# Patient Record
Sex: Female | Born: 1940 | Race: Black or African American | Hispanic: No | Marital: Married | Smoking: Never smoker
Health system: Southern US, Community
[De-identification: ages and names within clinical notes are randomized; demographics above are authoritative.]

## PROBLEM LIST (undated history)

## (undated) DIAGNOSIS — I10 Essential (primary) hypertension: Secondary | ICD-10-CM

## (undated) DIAGNOSIS — M7989 Other specified soft tissue disorders: Secondary | ICD-10-CM

## (undated) DIAGNOSIS — R197 Diarrhea, unspecified: Secondary | ICD-10-CM

## (undated) DIAGNOSIS — E785 Hyperlipidemia, unspecified: Secondary | ICD-10-CM

## (undated) DIAGNOSIS — E669 Obesity, unspecified: Secondary | ICD-10-CM

## (undated) DIAGNOSIS — K219 Gastro-esophageal reflux disease without esophagitis: Secondary | ICD-10-CM

## (undated) DIAGNOSIS — N189 Chronic kidney disease, unspecified: Secondary | ICD-10-CM

## (undated) DIAGNOSIS — H3530 Unspecified macular degeneration: Secondary | ICD-10-CM

## (undated) DIAGNOSIS — R739 Hyperglycemia, unspecified: Secondary | ICD-10-CM

## (undated) DIAGNOSIS — E739 Lactose intolerance, unspecified: Secondary | ICD-10-CM

## (undated) HISTORY — DX: Diarrhea, unspecified: R19.7

## (undated) HISTORY — DX: Gastro-esophageal reflux disease without esophagitis: K21.9

## (undated) HISTORY — DX: Unspecified macular degeneration: H35.30

## (undated) HISTORY — DX: Hyperlipidemia, unspecified: E78.5

## (undated) HISTORY — PX: PARS PLANA VITRECTOMY W/ REPAIR OF MACULAR HOLE: SHX2170

## (undated) HISTORY — DX: Lactose intolerance, unspecified: E73.9

## (undated) HISTORY — DX: Other specified soft tissue disorders: M79.89

## (undated) HISTORY — PX: CATARACT EXTRACTION: SUR2

## (undated) HISTORY — DX: Hyperglycemia, unspecified: R73.9

## (undated) HISTORY — DX: Obesity, unspecified: E66.9

## (undated) HISTORY — DX: Essential (primary) hypertension: I10

## (undated) HISTORY — DX: Chronic kidney disease, unspecified: N18.9

---

## 1983-11-24 HISTORY — PX: APPENDECTOMY: SHX54

## 1983-11-24 HISTORY — PX: ABDOMINAL HYSTERECTOMY: SHX81

## 1999-05-14 ENCOUNTER — Ambulatory Visit (HOSPITAL_COMMUNITY): Admission: RE | Admit: 1999-05-14 | Discharge: 1999-05-14 | Payer: Self-pay | Admitting: Internal Medicine

## 1999-05-14 ENCOUNTER — Encounter: Payer: Self-pay | Admitting: Internal Medicine

## 2002-01-24 ENCOUNTER — Ambulatory Visit (HOSPITAL_COMMUNITY): Admission: RE | Admit: 2002-01-24 | Discharge: 2002-01-24 | Payer: Self-pay | Admitting: Endocrinology

## 2002-01-24 ENCOUNTER — Encounter: Payer: Self-pay | Admitting: Endocrinology

## 2002-01-29 ENCOUNTER — Emergency Department (HOSPITAL_COMMUNITY): Admission: EM | Admit: 2002-01-29 | Discharge: 2002-01-29 | Payer: Self-pay | Admitting: Emergency Medicine

## 2002-02-06 ENCOUNTER — Encounter: Payer: Self-pay | Admitting: Neurosurgery

## 2002-02-06 ENCOUNTER — Encounter: Admission: RE | Admit: 2002-02-06 | Discharge: 2002-02-06 | Payer: Self-pay | Admitting: Neurosurgery

## 2002-02-08 ENCOUNTER — Encounter: Payer: Self-pay | Admitting: Neurosurgery

## 2002-02-10 ENCOUNTER — Encounter: Payer: Self-pay | Admitting: Neurosurgery

## 2002-02-10 ENCOUNTER — Inpatient Hospital Stay (HOSPITAL_COMMUNITY): Admission: RE | Admit: 2002-02-10 | Discharge: 2002-02-11 | Payer: Self-pay | Admitting: Neurosurgery

## 2003-08-29 ENCOUNTER — Ambulatory Visit (HOSPITAL_COMMUNITY): Admission: RE | Admit: 2003-08-29 | Discharge: 2003-08-29 | Payer: Self-pay | Admitting: Internal Medicine

## 2003-08-29 ENCOUNTER — Encounter: Payer: Self-pay | Admitting: Internal Medicine

## 2004-03-18 ENCOUNTER — Encounter: Admission: RE | Admit: 2004-03-18 | Discharge: 2004-04-03 | Payer: Self-pay | Admitting: Internal Medicine

## 2005-03-03 ENCOUNTER — Ambulatory Visit: Payer: Self-pay

## 2005-03-24 ENCOUNTER — Ambulatory Visit: Payer: Self-pay | Admitting: Internal Medicine

## 2005-03-25 ENCOUNTER — Ambulatory Visit (HOSPITAL_COMMUNITY): Admission: RE | Admit: 2005-03-25 | Discharge: 2005-03-25 | Payer: Self-pay | Admitting: Internal Medicine

## 2005-04-04 ENCOUNTER — Ambulatory Visit (HOSPITAL_COMMUNITY): Admission: RE | Admit: 2005-04-04 | Discharge: 2005-04-04 | Payer: Self-pay | Admitting: Internal Medicine

## 2005-04-14 ENCOUNTER — Ambulatory Visit: Payer: Self-pay | Admitting: Internal Medicine

## 2005-08-03 ENCOUNTER — Ambulatory Visit: Payer: Self-pay | Admitting: Internal Medicine

## 2005-08-04 ENCOUNTER — Ambulatory Visit: Payer: Self-pay | Admitting: Internal Medicine

## 2005-11-23 HISTORY — PX: LUMBAR LAMINECTOMY: SHX95

## 2005-11-27 ENCOUNTER — Ambulatory Visit: Payer: Self-pay | Admitting: Internal Medicine

## 2005-12-01 ENCOUNTER — Ambulatory Visit: Payer: Self-pay | Admitting: Internal Medicine

## 2005-12-10 ENCOUNTER — Ambulatory Visit (HOSPITAL_COMMUNITY): Admission: RE | Admit: 2005-12-10 | Discharge: 2005-12-10 | Payer: Self-pay | Admitting: Internal Medicine

## 2006-05-24 ENCOUNTER — Ambulatory Visit: Payer: Self-pay | Admitting: Internal Medicine

## 2006-05-28 ENCOUNTER — Ambulatory Visit: Payer: Self-pay | Admitting: Internal Medicine

## 2006-12-16 ENCOUNTER — Ambulatory Visit: Payer: Self-pay | Admitting: Internal Medicine

## 2006-12-16 LAB — CONVERTED CEMR LAB
Alkaline Phosphatase: 72 units/L (ref 39–117)
BUN: 28 mg/dL — ABNORMAL HIGH (ref 6–23)
CO2: 31 meq/L (ref 19–32)
Chloride: 107 meq/L (ref 96–112)
GFR calc Af Amer: 45 mL/min
GFR calc non Af Amer: 37 mL/min
Glucose, Bld: 105 mg/dL — ABNORMAL HIGH (ref 70–99)
HDL: 34.1 mg/dL — ABNORMAL LOW (ref >39.0)
Hgb A1c MFr Bld: 6.1 % — ABNORMAL HIGH (ref 4.6–6.0)
LDL Cholesterol: 99 mg/dL (ref 0–99)
Sodium: 145 meq/L (ref 135–145)
Total CHOL/HDL Ratio: 4.5
Triglycerides: 101 mg/dL (ref 0–149)
VLDL: 20 mg/dL (ref 0–40)

## 2006-12-20 ENCOUNTER — Ambulatory Visit: Payer: Self-pay | Admitting: Internal Medicine

## 2007-02-09 ENCOUNTER — Ambulatory Visit: Payer: Self-pay | Admitting: Gastroenterology

## 2007-03-15 ENCOUNTER — Ambulatory Visit: Payer: Self-pay | Admitting: Internal Medicine

## 2007-03-15 ENCOUNTER — Ambulatory Visit: Payer: Self-pay

## 2007-03-15 LAB — CONVERTED CEMR LAB
ALT: 92 units/L — ABNORMAL HIGH (ref 0–40)
AST: 83 units/L — ABNORMAL HIGH (ref 0–37)
Alkaline Phosphatase: 71 units/L (ref 39–117)
Iron: 45 ug/dL (ref 42–145)
Total Bilirubin: 0.7 mg/dL (ref 0.3–1.2)

## 2007-03-22 ENCOUNTER — Ambulatory Visit: Payer: Self-pay | Admitting: Internal Medicine

## 2007-04-12 ENCOUNTER — Ambulatory Visit: Payer: Self-pay | Admitting: Gastroenterology

## 2007-04-12 LAB — CONVERTED CEMR LAB
ALT: 44 units/L — ABNORMAL HIGH (ref 0–40)
AST: 47 units/L — ABNORMAL HIGH (ref 0–37)
Anti Nuclear Antibody(ANA): NEGATIVE
Bilirubin, Direct: 0.2 mg/dL (ref 0.0–0.3)
Ferritin: 70.2 ng/mL (ref 10.0–291.0)
Hepatitis B Surface Ag: NEGATIVE
Saturation Ratios: 23.5 % (ref 20.0–50.0)
Total Bilirubin: 0.7 mg/dL (ref 0.3–1.2)

## 2007-09-19 ENCOUNTER — Ambulatory Visit: Payer: Self-pay | Admitting: Internal Medicine

## 2007-09-21 ENCOUNTER — Encounter: Payer: Self-pay | Admitting: Internal Medicine

## 2007-09-21 ENCOUNTER — Ambulatory Visit: Payer: Self-pay | Admitting: Internal Medicine

## 2007-09-21 DIAGNOSIS — E538 Deficiency of other specified B group vitamins: Secondary | ICD-10-CM | POA: Insufficient documentation

## 2007-09-21 DIAGNOSIS — I129 Hypertensive chronic kidney disease with stage 1 through stage 4 chronic kidney disease, or unspecified chronic kidney disease: Secondary | ICD-10-CM | POA: Insufficient documentation

## 2007-09-21 DIAGNOSIS — M79609 Pain in unspecified limb: Secondary | ICD-10-CM | POA: Insufficient documentation

## 2007-09-21 DIAGNOSIS — R609 Edema, unspecified: Secondary | ICD-10-CM | POA: Insufficient documentation

## 2007-10-18 ENCOUNTER — Encounter: Payer: Self-pay | Admitting: Internal Medicine

## 2007-12-23 ENCOUNTER — Encounter: Payer: Self-pay | Admitting: Internal Medicine

## 2008-01-06 ENCOUNTER — Encounter: Payer: Self-pay | Admitting: Internal Medicine

## 2008-02-28 ENCOUNTER — Ambulatory Visit: Payer: Self-pay | Admitting: Internal Medicine

## 2008-03-14 ENCOUNTER — Ambulatory Visit: Payer: Self-pay

## 2008-03-14 ENCOUNTER — Ambulatory Visit: Payer: Self-pay | Admitting: Cardiology

## 2008-03-14 LAB — CONVERTED CEMR LAB
ALT: 31 units/L (ref 0–35)
AST: 36 units/L (ref 0–37)
Alkaline Phosphatase: 60 units/L (ref 39–117)
Calcium: 9.1 mg/dL (ref 8.4–10.5)
Chloride: 105 meq/L (ref 96–112)
Cholesterol: 259 mg/dL (ref 0–200)
Direct LDL: 176.5 mg/dL
GFR calc Af Amer: 45 mL/min
GFR calc non Af Amer: 37 mL/min
HDL: 29.2 mg/dL — ABNORMAL LOW (ref >39.0)
Potassium: 3.4 meq/L — ABNORMAL LOW (ref 3.5–5.1)
Total CHOL/HDL Ratio: 8.9
Total Protein: 6.4 g/dL (ref 6.0–8.3)
Triglycerides: 156 mg/dL — ABNORMAL HIGH (ref 0–149)
VLDL: 31 mg/dL (ref 0–40)

## 2008-03-21 ENCOUNTER — Ambulatory Visit: Payer: Self-pay | Admitting: Internal Medicine

## 2008-03-21 DIAGNOSIS — E785 Hyperlipidemia, unspecified: Secondary | ICD-10-CM | POA: Insufficient documentation

## 2008-04-30 ENCOUNTER — Encounter: Payer: Self-pay | Admitting: Internal Medicine

## 2008-05-30 ENCOUNTER — Encounter: Payer: Self-pay | Admitting: Internal Medicine

## 2008-05-31 ENCOUNTER — Telehealth: Payer: Self-pay | Admitting: Internal Medicine

## 2008-07-17 ENCOUNTER — Ambulatory Visit: Payer: Self-pay | Admitting: Internal Medicine

## 2008-07-18 LAB — CONVERTED CEMR LAB
Albumin: 3.3 g/dL — ABNORMAL LOW (ref 3.5–5.2)
Alkaline Phosphatase: 53 units/L (ref 39–117)
CO2: 29 meq/L (ref 19–32)
GFR calc Af Amer: 41 mL/min
Glucose, Bld: 106 mg/dL — ABNORMAL HIGH (ref 70–99)
Hgb A1c MFr Bld: 6.2 % — ABNORMAL HIGH (ref 4.6–6.0)
Total Protein: 6.2 g/dL (ref 6.0–8.3)

## 2008-07-20 ENCOUNTER — Ambulatory Visit: Payer: Self-pay | Admitting: Internal Medicine

## 2008-07-20 DIAGNOSIS — N289 Disorder of kidney and ureter, unspecified: Secondary | ICD-10-CM | POA: Insufficient documentation

## 2008-10-29 ENCOUNTER — Ambulatory Visit: Payer: Self-pay | Admitting: Internal Medicine

## 2008-10-29 LAB — CONVERTED CEMR LAB
BUN: 26 mg/dL — ABNORMAL HIGH
CO2: 28 meq/L
Calcium: 9.4 mg/dL
Chloride: 106 meq/L
Creatinine, Ser: 1.7 mg/dL — ABNORMAL HIGH
GFR calc Af Amer: 39 mL/min
GFR calc non Af Amer: 32 mL/min
Glucose, Bld: 111 mg/dL — ABNORMAL HIGH
Hgb A1c MFr Bld: 6.3 % — ABNORMAL HIGH
Potassium: 3.8 meq/L
Sodium: 144 meq/L
TSH: 2.08 u[IU]/mL
Vitamin B-12: 889 pg/mL

## 2008-11-02 ENCOUNTER — Ambulatory Visit: Payer: Self-pay | Admitting: Internal Medicine

## 2008-11-02 DIAGNOSIS — R197 Diarrhea, unspecified: Secondary | ICD-10-CM | POA: Insufficient documentation

## 2008-11-02 DIAGNOSIS — R071 Chest pain on breathing: Secondary | ICD-10-CM | POA: Insufficient documentation

## 2009-01-03 ENCOUNTER — Encounter: Payer: Self-pay | Admitting: Internal Medicine

## 2009-01-08 ENCOUNTER — Encounter: Payer: Self-pay | Admitting: Internal Medicine

## 2009-03-05 ENCOUNTER — Ambulatory Visit: Payer: Self-pay | Admitting: Internal Medicine

## 2009-03-05 LAB — CONVERTED CEMR LAB
Alkaline Phosphatase: 76 units/L (ref 39–117)
BUN: 21 mg/dL (ref 6–23)
CO2: 25 meq/L (ref 19–32)
Chloride: 112 meq/L (ref 96–112)
Creatinine, Ser: 1.2 mg/dL (ref 0.4–1.2)
GFR calc non Af Amer: 57.66 mL/min (ref >60)
Glucose, Bld: 100 mg/dL — ABNORMAL HIGH (ref 70–99)
HDL: 28.6 mg/dL — ABNORMAL LOW (ref >39.00)
Total Bilirubin: 0.6 mg/dL (ref 0.3–1.2)
Total CHOL/HDL Ratio: 8
Total Protein: 6 g/dL (ref 6.0–8.3)
Triglycerides: 155 mg/dL — ABNORMAL HIGH (ref 0.0–149.0)

## 2009-03-07 ENCOUNTER — Ambulatory Visit: Payer: Self-pay | Admitting: Internal Medicine

## 2009-03-14 ENCOUNTER — Ambulatory Visit: Payer: Self-pay

## 2009-03-14 ENCOUNTER — Encounter: Payer: Self-pay | Admitting: Internal Medicine

## 2009-03-18 ENCOUNTER — Telehealth: Payer: Self-pay | Admitting: Internal Medicine

## 2009-03-21 ENCOUNTER — Telehealth: Payer: Self-pay | Admitting: Internal Medicine

## 2009-07-05 ENCOUNTER — Ambulatory Visit: Payer: Self-pay | Admitting: Internal Medicine

## 2009-07-05 LAB — CONVERTED CEMR LAB
ALT: 37 units/L — ABNORMAL HIGH (ref 0–35)
Albumin: 3.4 g/dL — ABNORMAL LOW (ref 3.5–5.2)
BUN: 32 mg/dL — ABNORMAL HIGH (ref 6–23)
CO2: 25 meq/L (ref 19–32)
Chloride: 110 meq/L (ref 96–112)
Creatinine, Ser: 2 mg/dL — ABNORMAL HIGH (ref 0.4–1.2)
Potassium: 3.8 meq/L (ref 3.5–5.1)
Sodium: 148 meq/L — ABNORMAL HIGH (ref 135–145)
Total Protein: 6.3 g/dL (ref 6.0–8.3)

## 2009-07-09 ENCOUNTER — Ambulatory Visit: Payer: Self-pay | Admitting: Internal Medicine

## 2009-07-19 ENCOUNTER — Encounter: Payer: Self-pay | Admitting: Internal Medicine

## 2009-08-05 ENCOUNTER — Telehealth: Payer: Self-pay | Admitting: Internal Medicine

## 2009-08-08 ENCOUNTER — Encounter (INDEPENDENT_AMBULATORY_CARE_PROVIDER_SITE_OTHER): Payer: Self-pay | Admitting: *Deleted

## 2009-09-24 ENCOUNTER — Encounter: Payer: Self-pay | Admitting: Internal Medicine

## 2009-10-31 ENCOUNTER — Ambulatory Visit: Payer: Self-pay | Admitting: Internal Medicine

## 2009-10-31 LAB — CONVERTED CEMR LAB
ALT: 29 units/L (ref 0–35)
Albumin: 3.7 g/dL (ref 3.5–5.2)
BUN: 25 mg/dL — ABNORMAL HIGH (ref 6–23)
CO2: 31 meq/L (ref 19–32)
Calcium: 9.1 mg/dL (ref 8.4–10.5)
Chloride: 101 meq/L (ref 96–112)
Creatinine, Ser: 1.4 mg/dL — ABNORMAL HIGH (ref 0.4–1.2)
GFR calc non Af Amer: 48.17 mL/min (ref >60)
Glucose, Bld: 108 mg/dL — ABNORMAL HIGH (ref 70–99)
Potassium: 3.4 meq/L — ABNORMAL LOW (ref 3.5–5.1)
Total Bilirubin: 0.9 mg/dL (ref 0.3–1.2)

## 2009-11-05 ENCOUNTER — Ambulatory Visit: Payer: Self-pay | Admitting: Internal Medicine

## 2010-01-14 ENCOUNTER — Encounter: Payer: Self-pay | Admitting: Internal Medicine

## 2010-01-20 ENCOUNTER — Encounter: Payer: Self-pay | Admitting: Internal Medicine

## 2010-01-27 ENCOUNTER — Encounter: Payer: Self-pay | Admitting: Internal Medicine

## 2010-03-05 ENCOUNTER — Ambulatory Visit: Payer: Self-pay | Admitting: Internal Medicine

## 2010-03-07 LAB — CONVERTED CEMR LAB
ALT: 23 U/L
AST: 21 U/L
Albumin: 3.5 g/dL
Alkaline Phosphatase: 63 U/L
BUN: 31 mg/dL — ABNORMAL HIGH
Bilirubin, Direct: 0.1 mg/dL
CO2: 32 meq/L
Calcium: 9.2 mg/dL
Chloride: 100 meq/L
Cholesterol: 262 mg/dL — ABNORMAL HIGH
Creatinine, Ser: 1.3 mg/dL — ABNORMAL HIGH
Direct LDL: 137 mg/dL
GFR calc non Af Amer: 52.41 mL/min
Glucose, Bld: 106 mg/dL — ABNORMAL HIGH
HDL: 36.2 mg/dL — ABNORMAL LOW
Hgb A1c MFr Bld: 6.3 %
Potassium: 3.1 meq/L — ABNORMAL LOW
Sodium: 141 meq/L
Total Bilirubin: 0.5 mg/dL
Total CHOL/HDL Ratio: 7
Total Protein: 6.4 g/dL
Triglycerides: 310 mg/dL — ABNORMAL HIGH
Uric Acid, Serum: 11.1 mg/dL — ABNORMAL HIGH
VLDL: 62 mg/dL — ABNORMAL HIGH

## 2010-03-10 ENCOUNTER — Ambulatory Visit: Payer: Self-pay | Admitting: Internal Medicine

## 2010-03-10 DIAGNOSIS — M109 Gout, unspecified: Secondary | ICD-10-CM | POA: Insufficient documentation

## 2010-03-10 DIAGNOSIS — E876 Hypokalemia: Secondary | ICD-10-CM | POA: Insufficient documentation

## 2010-03-17 ENCOUNTER — Encounter: Payer: Self-pay | Admitting: Internal Medicine

## 2010-03-17 DIAGNOSIS — I6529 Occlusion and stenosis of unspecified carotid artery: Secondary | ICD-10-CM | POA: Insufficient documentation

## 2010-03-18 ENCOUNTER — Encounter: Payer: Self-pay | Admitting: Internal Medicine

## 2010-03-18 ENCOUNTER — Ambulatory Visit: Payer: Self-pay

## 2010-04-09 ENCOUNTER — Ambulatory Visit: Payer: Self-pay | Admitting: Internal Medicine

## 2010-04-11 ENCOUNTER — Telehealth: Payer: Self-pay | Admitting: Internal Medicine

## 2010-04-11 LAB — CONVERTED CEMR LAB
BUN: 48 mg/dL — ABNORMAL HIGH (ref 6–23)
Chloride: 105 meq/L (ref 96–112)
GFR calc non Af Amer: 32.62 mL/min (ref >60)
Glucose, Bld: 92 mg/dL (ref 70–99)
Potassium: 5.2 meq/L — ABNORMAL HIGH (ref 3.5–5.1)

## 2010-04-14 ENCOUNTER — Encounter: Payer: Self-pay | Admitting: Internal Medicine

## 2010-05-14 ENCOUNTER — Ambulatory Visit: Payer: Self-pay | Admitting: Internal Medicine

## 2010-05-14 LAB — CONVERTED CEMR LAB
Chloride: 104 meq/L (ref 96–112)
GFR calc non Af Amer: 40.06 mL/min (ref >60)
Glucose, Bld: 98 mg/dL (ref 70–99)

## 2010-07-03 ENCOUNTER — Ambulatory Visit: Payer: Self-pay | Admitting: Internal Medicine

## 2010-07-03 LAB — CONVERTED CEMR LAB
Albumin: 3.8 g/dL (ref 3.5–5.2)
BUN: 50 mg/dL — ABNORMAL HIGH (ref 6–23)
Bilirubin, Direct: 0.3 mg/dL (ref 0.0–0.3)
CO2: 25 meq/L (ref 19–32)
Chloride: 106 meq/L (ref 96–112)
Creatinine, Ser: 1.9 mg/dL — ABNORMAL HIGH (ref 0.4–1.2)
Glucose, Bld: 106 mg/dL — ABNORMAL HIGH (ref 70–99)
Hgb A1c MFr Bld: 6.6 % — ABNORMAL HIGH (ref 4.6–6.5)
Sodium: 142 meq/L (ref 135–145)
Total Bilirubin: 0.6 mg/dL (ref 0.3–1.2)
Total Protein: 6.6 g/dL (ref 6.0–8.3)
Uric Acid, Serum: 10.3 mg/dL — ABNORMAL HIGH (ref 2.4–7.0)

## 2010-07-08 ENCOUNTER — Ambulatory Visit: Payer: Self-pay | Admitting: Internal Medicine

## 2010-07-29 ENCOUNTER — Encounter: Payer: Self-pay | Admitting: Internal Medicine

## 2010-08-08 ENCOUNTER — Telehealth: Payer: Self-pay | Admitting: Internal Medicine

## 2010-10-02 ENCOUNTER — Ambulatory Visit: Payer: Self-pay | Admitting: Internal Medicine

## 2010-10-03 LAB — CONVERTED CEMR LAB
BUN: 33 mg/dL — ABNORMAL HIGH (ref 6–23)
Chloride: 104 meq/L (ref 96–112)
Glucose, Bld: 102 mg/dL — ABNORMAL HIGH (ref 70–99)
Potassium: 4.6 meq/L (ref 3.5–5.1)
Sodium: 138 meq/L (ref 135–145)

## 2010-10-07 ENCOUNTER — Ambulatory Visit: Payer: Self-pay | Admitting: Internal Medicine

## 2010-12-21 LAB — CONVERTED CEMR LAB
ALT: 29 units/L (ref 0–35)
Alkaline Phosphatase: 64 units/L (ref 39–117)
BUN: 21 mg/dL (ref 6–23)
Bilirubin, Direct: 0.1 mg/dL (ref 0.0–0.3)
CO2: 27 meq/L (ref 19–32)
Calcium: 9.3 mg/dL (ref 8.4–10.5)
GFR calc Af Amer: 49 mL/min
Glucose, Bld: 104 mg/dL — ABNORMAL HIGH (ref 70–99)
Total Bilirubin: 0.6 mg/dL (ref 0.3–1.2)
Total Protein: 6.6 g/dL (ref 6.0–8.3)
Vit D, 1,25-Dihydroxy: 21 — ABNORMAL LOW (ref 30–89)
Vitamin B-12: 266 pg/mL (ref 211–911)

## 2010-12-23 NOTE — Progress Notes (Signed)
Summary: RESULTS  Phone Note Call from Patient Call back at Home Phone [phone] Call back at Work Phone [phone]   Summary of Call: Pt informed of lab results. She does not have office visit w/DR Justin Mend. She said she was release from his care. Please adivse.  Initial call taken by: Charlsie Quest, [place],  Apr 11, 2010 5:03 PM  Follow-up for Phone Call        Noted. Dr Jason Nest notes reviewed. Her Creat came down to 1.0 in 11.10  Stop Aleve, Motrin if taking Drink more water  BMET in 1 month 585 Follow-up by: Cassandria Anger MD,  Apr 11, 2010 5:26 PM  Additional Follow-up for Phone Call Additional follow up Details #1::        Left detailed vm  on hm # Additional Follow-up by: Charlsie Quest, CMA,  Apr 14, 2010 11:39 AM

## 2010-12-23 NOTE — Miscellaneous (Signed)
Summary: Orders Update  Clinical Lists Changes  Problems: Added new problem of CAROTID ARTERY DISEASE (ICD-433.10) Orders: Added new Test order of Carotid Duplex (Carotid Duplex) - Signed 

## 2010-12-23 NOTE — Assessment & Plan Note (Signed)
Summary: 4 mth fu stc   Vital Signs:  Patient profile:   70 year old female Height:      63 inches Weight:      233 pounds BMI:     41.42 O2 Sat:      98 % on Room air Temp:     98.1 degrees F oral Pulse rate:   75 / minute Pulse rhythm:   regular Resp:     16 per minute BP sitting:   140 / 70  (left arm) Cuff size:   large  Vitals Entered By: Jonathon Resides, CMA(AAMA) (July 08, 2010 9:25 AM)  O2 Flow:  Room air CC: 4 mo f/u Is Patient Diabetic? No Comments FYI:  pt is having cataract surgery in September.   CC:  4 mo f/u.  History of Present Illness: The patient presents for a follow up of hypertension, obesity, hyperlipidemia, CRF   Current Medications (verified): 1)  Furosemide 80 Mg Tabs (Furosemide) .... Take 1 By Mouth Qd 2)  Klor-Con M20 20 Meq  Tbcr (Potassium Chloride Crys Cr) .Marland Kitchen.. 1 By Mouth Bid 3)  Vitamin B-12 1000 Mcg  Tabs (Cyanocobalamin) .... 1/2 Tab Once Daily 4)  Vitamin D3 1000 Unit  Tabs (Cholecalciferol) .Marland Kitchen.. 1 By Mouth Daily 5)  Coreg 25 Mg Tabs (Carvedilol) .Marland Kitchen.. 1 By Mouth Two Times A Day For Blood Pressure 6)  Pravastatin Sodium 40 Mg Tabs (Pravastatin Sodium) .Marland Kitchen.. 1 By Mouth Once Daily For Cholesterol 7)  Cozaar 100 Mg Tabs (Losartan Potassium) .Marland Kitchen.. 1 By Mouth Qd 8)  Spironolactone 50 Mg Tabs (Spironolactone) .Marland Kitchen.. 1 By Mouth Qam For Blood Pressure  Allergies (verified): No Known Drug Allergies  Past History:  Social History: Last updated: 09/21/2007 Married Never Smoked Occupation: Automotive engineer  Past Medical History: Elev. glu Hypertension LE swelling Vit B12 def Hyperlipidemia Obesity CRI  Stage 3 Lactose intol Diarrhea Gout Macular degeneration Diabetes mellitus, type II 2011  Past Surgical History: Caesarean section Hysterectomy Lumbar laminectomy Cataract extraction  Review of Systems       The patient complains of weight gain.  The patient denies prolonged cough, abdominal pain, hematochezia, difficulty  walking, and depression.    Physical Exam  General:  overweight-appearing.   Nose:  External nasal examination shows no deformity or inflammation. Nasal mucosa are pink and moist without lesions or exudates. Mouth:  WNL Neck:  No deformities, masses, or tenderness noted. Lungs:  Normal respiratory effort, chest expands symmetrically. Lungs are clear to auscultation, no crackles or wheezes. Heart:  Normal rate and regular rhythm. S1 and S2 normal without gallop, murmur, click, rub or other extra sounds. Abdomen:  Bowel sounds positive,abdomen soft and non-tender without masses, organomegaly or hernias noted. Msk:  LS NT Extremities:  trace right pedal edema.   Neurologic:  No cranial nerve deficits noted. Station and gait are normal. Plantar reflexes are down-going bilaterally. DTRs are symmetrical throughout. Sensory, motor and coordinative functions appear intact. Skin:  Intact without suspicious lesions or rashes Psych:  Cognition and judgment appear intact. Alert and cooperative with normal attention span and concentration. No apparent delusions, illusions, hallucinations   Impression & Recommendations:  Problem # 1:  DIABETES MELLITUS, TYPE II (ICD-250.00) Assessment New  Her updated medication list for this problem includes:    Cozaar 100 Mg Tabs (Losartan potassium) .Marland Kitchen... 1 by mouth qd    Metformin Hcl 500 Mg Tabs (Metformin hcl) .Marland Kitchen... 1 by mouth qd for diabetes (low dose should be ok w/her CRF)  Orders: Diabetic Clinic Referral (Diabetic)  Problem # 2:  CAROTID ARTERY DISEASE (ICD-433.10) Assessment: Unchanged On the regimen of medicine(s) reflected in the chart    Problem # 3:  GOUT (ICD-274.9) Assessment: Unchanged No symptoms  Uric acid is very high - may need to treat  Problem # 4:  OBESITY, MORBID (ICD-278.01) Assessment: Deteriorated  Orders: Diabetic Clinic Referral (Diabetic)  Problem # 5:  B12 DEFICIENCY (ICD-266.2) Assessment: Unchanged On the regimen  of medicine(s) reflected in the chart    Problem # 6:  HYPERTENSION (ICD-401.9) Assessment: Improved  Her updated medication list for this problem includes:    Furosemide 80 Mg Tabs (Furosemide) .Marland Kitchen... Take 1 by mouth qd    Coreg 25 Mg Tabs (Carvedilol) .Marland Kitchen... 1 by mouth two times a day for blood pressure    Cozaar 100 Mg Tabs (Losartan potassium) .Marland Kitchen... 1 by mouth qd    Spironolactone 50 Mg Tabs (Spironolactone) .Marland Kitchen... 1 by mouth qam for blood pressure  Complete Medication List: 1)  Furosemide 80 Mg Tabs (Furosemide) .... Take 1 by mouth qd 2)  Klor-con M20 20 Meq Tbcr (Potassium chloride crys cr) .Marland Kitchen.. 1 by mouth bid 3)  Vitamin B-12 1000 Mcg Tabs (Cyanocobalamin) .... 1/2 tab once daily 4)  Vitamin D3 1000 Unit Tabs (Cholecalciferol) .Marland Kitchen.. 1 by mouth daily 5)  Coreg 25 Mg Tabs (Carvedilol) .Marland Kitchen.. 1 by mouth two times a day for blood pressure 6)  Pravastatin Sodium 40 Mg Tabs (Pravastatin sodium) .Marland Kitchen.. 1 by mouth once daily for cholesterol 7)  Cozaar 100 Mg Tabs (Losartan potassium) .Marland Kitchen.. 1 by mouth qd 8)  Spironolactone 50 Mg Tabs (Spironolactone) .Marland Kitchen.. 1 by mouth qam for blood pressure 9)  Metformin Hcl 500 Mg Tabs (Metformin hcl) .Marland Kitchen.. 1 by mouth qd for diabetes  Patient Instructions: 1)  Please schedule a follow-up appointment in 3 months. 2)  BMP prior to visit, ICD-9: 3)  HbgA1C prior to visit, ICD-9:250.00 4)  uric acid 995.20 Prescriptions: METFORMIN HCL 500 MG TABS (METFORMIN HCL) 1 by mouth qd for diabetes  #30 x 12   Entered and Authorized by:   Cassandria Anger MD   Signed by:   Charlsie Quest, CMA on 07/09/2010   Method used:   Electronically to        [facility]. [id number]* (retail)       [address]       [place], [place]  [address]       Ph: [phone] or [phone]       Fax: [phone]   RxID:   [id number]

## 2010-12-23 NOTE — Progress Notes (Signed)
Summary: Med ?  Phone Note From Pharmacy   Summary of Call: Rec Rf request for Losartan-HCTZ 100-25.  The current med list has Losartan 100mg .  Which is correct? Initial call taken by: Jonathon Resides, [facility]),  August 08, 2010 8:53 AM  Follow-up for Phone Call        what is she taking Follow-up by: Cassandria Anger MD,  August 08, 2010 9:42 AM  Additional Follow-up for Phone Call Additional follow up Details #1::        left mess for pt to call back  Additional Follow-up by: Jonathon Resides, [facility]),  August 08, 2010 2:33 PM    Additional Follow-up for Phone Call Additional follow up Details #2::    pt states she is taking Losartan 100mg  1 once daily  Follow-up by: Rebeca Alert MA,  August 11, 2010 4:12 PM  Additional Follow-up for Phone Call Additional follow up Details #3:: Details for Additional Follow-up Action Taken: Rf sent for [facility] 100mg   Additional Follow-up by: Jonathon Resides, [facility]),  August 11, 2010 4:56 PM

## 2010-12-23 NOTE — Assessment & Plan Note (Signed)
Summary: 4 MO ROV /NWS  #   Vital Signs:  Patient profile:   70 year old female Height:      63 inches Weight:      226.25 pounds BMI:     40.22 O2 Sat:      95 % on Room air Temp:     97.2 degrees F oral Pulse rate:   75 / minute BP sitting:   140 / 60  (left arm) Cuff size:   large  Vitals Entered By: Ernestene Mention (March 10, 2010 9:37 AM)  O2 Flow:  Room air CC: 4 mo rtn ov--No symptoms or complaints per patient./kb Is Patient Diabetic? No Pain Assessment Patient in pain? no      CBG Result 157 CBG Device ID Freestyle Lite Comments Patient needs refill of Pravastating and Potassium./kb   CC:  4 mo rtn ov--No symptoms or complaints per patient./kb.  History of Present Illness: The patient presents for a follow up of hypertension, diabetes, hyperlipidemia She ran out of Pravachol Gout attack x 2/year  Current Medications (verified): 1)  Furosemide 80 Mg Tabs (Furosemide) .... Take 1 By Mouth Qd 2)  Klor-Con M20 20 Meq  Tbcr (Potassium Chloride Crys Cr) .Marland Kitchen.. 1 By Mouth Tid 3)  Vitamin B-12 1000 Mcg  Tabs (Cyanocobalamin) .... 1/2 Tab Once Daily 4)  Vitamin D3 1000 Unit  Tabs (Cholecalciferol) .Marland Kitchen.. 1 By Mouth Daily 5)  Hyzaar 100-25 Mg Tabs (Losartan Potassium-Hctz) .... Take 1 Tab By Mouth Daily 6)  Coreg 25 Mg Tabs (Carvedilol) .Marland Kitchen.. 1 By Mouth Two Times A Day For Blood Pressure 7)  Pravastatin Sodium 40 Mg Tabs (Pravastatin Sodium) .Marland Kitchen.. 1 By Mouth Once Daily For Cholesterol  Allergies (verified): No Known Drug Allergies  Past History:  Social History: Last updated: 09/21/2007 Married Never Smoked Occupation: Automotive engineer  Past Medical History: Elev. glu Hypertension LE swelling Vit B12 def Hyperlipidemia Obesity CRI  Stage 3 Lactose intol Diarrhea Gout  Review of Systems       The patient complains of weight gain and dyspnea on exertion.  The patient denies chest pain, abdominal pain, and melena.    Physical Exam  General:   overweight-appearing.   Ears:  WNL Nose:  External nasal examination shows no deformity or inflammation. Nasal mucosa are pink and moist without lesions or exudates. Mouth:  WNL Neck:  No deformities, masses, or tenderness noted. Lungs:  Normal respiratory effort, chest expands symmetrically. Lungs are clear to auscultation, no crackles or wheezes. Heart:  Normal rate and regular rhythm. S1 and S2 normal without gallop, murmur, click, rub or other extra sounds. Abdomen:  Bowel sounds positive,abdomen soft and non-tender without masses, organomegaly or hernias noted. Msk:  LS NT Neurologic:  No cranial nerve deficits noted. Station and gait are normal. Plantar reflexes are down-going bilaterally. DTRs are symmetrical throughout. Sensory, motor and coordinative functions appear intact. Skin:  Intact without suspicious lesions or rashes Psych:  Cognition and judgment appear intact. Alert and cooperative with normal attention span and concentration. No apparent delusions, illusions, hallucinations   Impression & Recommendations:  Problem # 1:  HYPERLIPIDEMIA (ICD-272.4) Assessment Deteriorated  Her updated medication list for this problem includes:    Pravastatin Sodium 40 Mg Tabs (Pravastatin sodium) .Marland Kitchen... 1 by mouth once daily for cholesterol - restart  Problem # 2:  RENAL INSUFFICIENCY (ICD-593.9) Assessment: Unchanged The labs were reviewed with the patient.   Problem # 3:  OBESITY, MORBID (ICD-278.01) Assessment: Deteriorated  Problem # 4:  B12 DEFICIENCY (ICD-266.2) Assessment: Unchanged On prescription/OTC  therapy   Problem # 5:  HYPERTENSION (ICD-401.9) Assessment: Improved  The following medications were removed from the medication list:    Doxazosin Mesylate 8 Mg Tabs (Doxazosin mesylate) .Marland Kitchen... Take 1 tab by mouth at bedtime    Hyzaar 100-25 Mg Tabs (Losartan potassium-hctz) .Marland Kitchen... Take 1 tab by mouth daily Her updated medication list for this problem includes:     Furosemide 80 Mg Tabs (Furosemide) .Marland Kitchen... Take 1 by mouth qd    Coreg 25 Mg Tabs (Carvedilol) .Marland Kitchen... 1 by mouth two times a day for blood pressure    Cozaar 100 Mg Tabs (Losartan potassium) .Marland Kitchen... 1 by mouth qd    Spironolactone 50 Mg Tabs (Spironolactone) .Marland Kitchen... 1 by mouth qam for blood pressure  Orders: Glucose, (CBG) [id number])  Problem # 6:  HYPOKALEMIA (ICD-276.8) Assessment: Deteriorated D/c HCTZ  Problem # 7:  GOUT (ICD-274.9) Assessment: Unchanged D/c HCTZ  Complete Medication List: 1)  Furosemide 80 Mg Tabs (Furosemide) .... Take 1 by mouth qd 2)  Klor-con M20 20 Meq Tbcr (Potassium chloride crys cr) .Marland Kitchen.. 1 by mouth bid 3)  Vitamin B-12 1000 Mcg Tabs (Cyanocobalamin) .... 1/2 tab once daily 4)  Vitamin D3 1000 Unit Tabs (Cholecalciferol) .Marland Kitchen.. 1 by mouth daily 5)  Coreg 25 Mg Tabs (Carvedilol) .Marland Kitchen.. 1 by mouth two times a day for blood pressure 6)  Pravastatin Sodium 40 Mg Tabs (Pravastatin sodium) .Marland Kitchen.. 1 by mouth once daily for cholesterol 7)  Cozaar 100 Mg Tabs (Losartan potassium) .Marland Kitchen.. 1 by mouth qd 8)  Spironolactone 50 Mg Tabs (Spironolactone) .Marland Kitchen.. 1 by mouth qam for blood pressure  Patient Instructions: 1)  Please schedule a follow-up appointment in 4 months. 2)  BMP prior to visit, ICD-9: 3)  Hepatic Panel prior to visit, ICD-9: 4)  HbgA1C prior to visit, ICD-9: 5)  Vit B12 250.00 6)  uric acid 7)  BMET in 1 month 401.1 Prescriptions: PRAVASTATIN SODIUM 40 MG TABS (PRAVASTATIN SODIUM) 1 by mouth once daily for cholesterol  #90 x 3   Entered and Authorized by:   Cassandria Anger MD   Signed by:   Cassandria Anger MD on 03/10/2010   Method used:   Print then Give to Patient   RxID:   [id number] KLOR-CON M20 20 MEQ  TBCR (POTASSIUM CHLORIDE CRYS CR) 1 by mouth bid  #180 x 3   Entered and Authorized by:   Cassandria Anger MD   Signed by:   Cassandria Anger MD on 03/10/2010   Method used:   Print then Give to Patient   RxID:    [id number] SPIRONOLACTONE 50 MG TABS (SPIRONOLACTONE) 1 by mouth qam for blood pressure  #90 x 3   Entered and Authorized by:   Cassandria Anger MD   Signed by:   Cassandria Anger MD on 03/10/2010   Method used:   Print then Give to Patient   RxID:   [id number] COZAAR 100 MG TABS (LOSARTAN POTASSIUM) 1 by mouth qd  #90 x 3   Entered and Authorized by:   Cassandria Anger MD   Signed by:   Cassandria Anger MD on 03/10/2010   Method used:   Print then Give to Patient   RxID:   [id number]

## 2010-12-23 NOTE — Assessment & Plan Note (Signed)
Summary: 3 mos f/u #/cd   Vital Signs:  Patient profile:   70 year old female Height:      63 inches Weight:      230 pounds BMI:     40.89 Temp:     97.7 degrees F oral Pulse rate:   64 / minute Pulse rhythm:   regular Resp:     16 per minute BP sitting:   120 / 78  (left arm) Cuff size:   large  Vitals Entered By: Jonathon Resides, CMA(AAMA) (October 07, 2010 7:50 AM) CC: 3 mo f/u Is Patient Diabetic? Yes   CC:  3 mo f/u.  History of Present Illness: The patient presents for a follow up of hypertension, diabetes, hyperlipidemia   Current Medications (verified): 1)  Furosemide 80 Mg Tabs (Furosemide) .... Take 1 By Mouth Qd 2)  Klor-Con M20 20 Meq  Tbcr (Potassium Chloride Crys Cr) .Marland Kitchen.. 1 By Mouth Bid 3)  Vitamin B-12 1000 Mcg  Tabs (Cyanocobalamin) .... 1/2 Tab Once Daily 4)  Vitamin D3 1000 Unit  Tabs (Cholecalciferol) .Marland Kitchen.. 1 By Mouth Daily 5)  Coreg 25 Mg Tabs (Carvedilol) .Marland Kitchen.. 1 By Mouth Two Times A Day For Blood Pressure 6)  Pravastatin Sodium 40 Mg Tabs (Pravastatin Sodium) .Marland Kitchen.. 1 By Mouth Once Daily For Cholesterol 7)  Cozaar 100 Mg Tabs (Losartan Potassium) .Marland Kitchen.. 1 By Mouth Qd 8)  Spironolactone 50 Mg Tabs (Spironolactone) .Marland Kitchen.. 1 By Mouth Qam For Blood Pressure 9)  Metformin Hcl 500 Mg Tabs (Metformin Hcl) .Marland Kitchen.. 1 By Mouth Qd For Diabetes  Allergies (verified): No Known Drug Allergies  Past History:  Past Medical History: Last updated: 07/08/2010 Elev. glu Hypertension LE swelling Vit B12 def Hyperlipidemia Obesity CRI  Stage 3 Lactose intol Diarrhea Gout Macular degeneration Diabetes mellitus, type II 2011  Past Surgical History: Caesarean section Hysterectomy Lumbar laminectomy Cataract extraction Macular repair B  Review of Systems  The patient denies fever, vision loss, chest pain, dyspnea on exertion, and abdominal pain.         No gout  Physical Exam  General:  overweight-appearing.   Nose:  External nasal examination shows no  deformity or inflammation. Nasal mucosa are pink and moist without lesions or exudates. Mouth:  WNL Neck:  No deformities, masses, or tenderness noted. Lungs:  Normal respiratory effort, chest expands symmetrically. Lungs are clear to auscultation, no crackles or wheezes. Heart:  Normal rate and regular rhythm. S1 and S2 normal without gallop, murmur, click, rub or other extra sounds. Abdomen:  Bowel sounds positive,abdomen soft and non-tender without masses, organomegaly or hernias noted. Msk:  LS NT Extremities:  trace right pedal edema.   Neurologic:  No cranial nerve deficits noted. Station and gait are normal. Plantar reflexes are down-going bilaterally. DTRs are symmetrical throughout. Sensory, motor and coordinative functions appear intact. Skin:  Intact without suspicious lesions or rashes Psych:  Cognition and judgment appear intact. Alert and cooperative with normal attention span and concentration. No apparent delusions, illusions, hallucinations   Impression & Recommendations:  Problem # 1:  DIABETES MELLITUS, TYPE II (ICD-250.00) Assessment Unchanged  Her updated medication list for this problem includes:    Cozaar 100 Mg Tabs (Losartan potassium) .Marland Kitchen... 1 by mouth qd    Metformin Hcl 500 Mg Tabs (Metformin hcl) .Marland Kitchen... 1 by mouth qd for diabetes  Labs Reviewed: Creat: 1.8 (10/02/2010)    Reviewed HgBA1c results: 6.8 (10/02/2010)  6.6 (07/03/2010)  Problem # 2:  GOUT (ICD-274.9) Assessment: Unchanged  Her  updated medication list for this problem includes:    Allopurinol 100 Mg Tabs (Allopurinol) .Marland Kitchen... 1 by mouth once daily for gout prevention - low dose w/caution  Problem # 3:  RENAL INSUFFICIENCY (ICD-593.9) Assessment: Unchanged Other meds w/caution Reduce Furosemide  Problem # 4:  OBESITY, MORBID (ICD-278.01) Assessment: Improved  Problem # 5:  B12 DEFICIENCY (ICD-266.2) Assessment: Unchanged On the regimen of medicine(s) reflected in the chart    Complete  Medication List: 1)  Furosemide 80 Mg Tabs (Furosemide) .... Take 1/2 tab by mouth once daily 2)  Klor-con M20 20 Meq Tbcr (Potassium chloride crys cr) .Marland Kitchen.. 1 by mouth bid 3)  Vitamin B-12 1000 Mcg Tabs (Cyanocobalamin) .... 1/2 tab once daily 4)  Vitamin D3 1000 Unit Tabs (Cholecalciferol) .Marland Kitchen.. 1 by mouth daily 5)  Coreg 25 Mg Tabs (Carvedilol) .Marland Kitchen.. 1 by mouth two times a day for blood pressure 6)  Pravastatin Sodium 40 Mg Tabs (Pravastatin sodium) .Marland Kitchen.. 1 by mouth once daily for cholesterol 7)  Cozaar 100 Mg Tabs (Losartan potassium) .Marland Kitchen.. 1 by mouth qd 8)  Spironolactone 50 Mg Tabs (Spironolactone) .Marland Kitchen.. 1 by mouth qam for blood pressure 9)  Metformin Hcl 500 Mg Tabs (Metformin hcl) .Marland Kitchen.. 1 by mouth qd for diabetes 10)  Allopurinol 100 Mg Tabs (Allopurinol) .Marland Kitchen.. 1 by mouth once daily for gout prevention  Other Orders: Admin 1st Vaccine [id number]) Flu Vaccine 1yrs + [id number])  Patient Instructions: 1)  Please schedule a follow-up appointment in 3 months well w/labs and A1c and Vit B12 and Uric acid  995.20 250.00 266.20.  Prescriptions: ALLOPURINOL 100 MG TABS (ALLOPURINOL) 1 by mouth once daily for gout prevention  #30 x 12   Entered and Authorized by:   Cassandria Anger MD   Signed by:   Cassandria Anger MD on 10/07/2010   Method used:   Electronically to        [facility]. [id number]* (retail)       [address] [place]       [place], [place]  [address]       Ph: [phone] or [phone]       Fax: [phone]   RxID:   [id number]    Orders Added: 1)  Est. Patient Level IV [id number] 2)  Admin 1st Vaccine [id number] 3)  Flu Vaccine 70yrs + [id number]           .[email or web address]   Flu Vaccine Consent Questions     Do you have a history of severe allergic reactions to this vaccine? no    Any prior history of allergic reactions to egg and/or gelatin? no    Do you have a sensitivity to the preservative Thimersol? no    Do you have a past history of Guillan-Barre  Syndrome? no    Do you currently have an acute febrile illness? no    Have you ever had a severe reaction to latex? no    Vaccine information given and explained to patient? yes    Are you currently pregnant? no    Lot Number:[id number]   Exp Date:05/23/2011   Site Given  Left Deltoid IM Jonathon Resides, [facility])  October 07, 2010 8:11 AM

## 2010-12-23 NOTE — Letter (Signed)
Summary: Generic Letter  [facility]   [place], [place] [address]   Phone: [phone]  Fax: [phone]    04/14/2010  CHIQUETTA PEELER [address] [place], [place]  [address]  Dear Ms. Tennison,   Dr Alain Marion has review Dr Jason Nest notes. Your lab results have improved some. Please stop taking over the counter aleve, advil or motrin and drink more water. You need to come into the lab for additional testing in one month. This order has been put into the computer at the lab and does not require you to be fasting. Please call our office with any questions.       Sincerely,   Charlsie Quest, CMA (AAMA) For Dr A. Kyjuan Gause  Appended Document: Generic Letter Mailed

## 2010-12-31 ENCOUNTER — Encounter: Payer: Self-pay | Admitting: Internal Medicine

## 2010-12-31 ENCOUNTER — Other Ambulatory Visit: Payer: Self-pay

## 2011-01-07 ENCOUNTER — Other Ambulatory Visit: Payer: Self-pay | Admitting: Internal Medicine

## 2011-01-07 ENCOUNTER — Encounter (INDEPENDENT_AMBULATORY_CARE_PROVIDER_SITE_OTHER): Payer: Self-pay | Admitting: *Deleted

## 2011-01-07 ENCOUNTER — Other Ambulatory Visit: Payer: 59

## 2011-01-07 DIAGNOSIS — M109 Gout, unspecified: Secondary | ICD-10-CM

## 2011-01-07 DIAGNOSIS — E119 Type 2 diabetes mellitus without complications: Secondary | ICD-10-CM

## 2011-01-07 DIAGNOSIS — Z0000 Encounter for general adult medical examination without abnormal findings: Secondary | ICD-10-CM

## 2011-01-07 DIAGNOSIS — E538 Deficiency of other specified B group vitamins: Secondary | ICD-10-CM

## 2011-01-07 LAB — URINALYSIS
Bilirubin Urine: NEGATIVE
Ketones, ur: NEGATIVE
Nitrite: NEGATIVE
Total Protein, Urine: NEGATIVE
Urine Glucose: NEGATIVE
pH: 5.5 (ref 5.0–8.0)

## 2011-01-07 LAB — CBC WITH DIFFERENTIAL/PLATELET
Basophils Absolute: 0 10*3/uL (ref 0.0–0.1)
HCT: 41.2 % (ref 36.0–46.0)
Hemoglobin: 13.9 g/dL (ref 12.0–15.0)
MCHC: 33.9 g/dL (ref 30.0–36.0)
MCV: 83.4 fl (ref 78.0–100.0)
Neutro Abs: 2.6 10*3/uL (ref 1.4–7.7)
Platelets: 238 10*3/uL (ref 150.0–400.0)
RBC: 4.94 Mil/uL (ref 3.87–5.11)
WBC: 6 10*3/uL (ref 4.5–10.5)

## 2011-01-07 LAB — HEPATIC FUNCTION PANEL
ALT: 22 U/L (ref 0–35)
AST: 22 U/L (ref 0–37)
Albumin: 3.8 g/dL (ref 3.5–5.2)
Alkaline Phosphatase: 58 U/L (ref 39–117)
Bilirubin, Direct: 0.1 mg/dL (ref 0.0–0.3)
Total Bilirubin: 0.5 mg/dL (ref 0.3–1.2)
Total Protein: 6.6 g/dL (ref 6.0–8.3)

## 2011-01-07 LAB — LIPID PANEL
Cholesterol: 188 mg/dL (ref 0–200)
HDL: 30.5 mg/dL — ABNORMAL LOW
LDL Cholesterol: 118 mg/dL — ABNORMAL HIGH (ref 0–99)
Total CHOL/HDL Ratio: 6
Triglycerides: 200 mg/dL — ABNORMAL HIGH (ref 0.0–149.0)
VLDL: 40 mg/dL (ref 0.0–40.0)

## 2011-01-07 LAB — BASIC METABOLIC PANEL WITH GFR
BUN: 40 mg/dL — ABNORMAL HIGH (ref 6–23)
CO2: 28 meq/L (ref 19–32)
Calcium: 10 mg/dL (ref 8.4–10.5)
Chloride: 102 meq/L (ref 96–112)
Creatinine, Ser: 2 mg/dL — ABNORMAL HIGH (ref 0.4–1.2)
GFR: 31.62 mL/min — ABNORMAL LOW
Glucose, Bld: 105 mg/dL — ABNORMAL HIGH (ref 70–99)
Potassium: 5.1 meq/L (ref 3.5–5.1)
Sodium: 138 meq/L (ref 135–145)

## 2011-01-07 LAB — TSH: TSH: 2.65 u[IU]/mL (ref 0.35–5.50)

## 2011-01-07 LAB — HEMOGLOBIN A1C: Hgb A1c MFr Bld: 6.5 % (ref 4.6–6.5)

## 2011-01-12 ENCOUNTER — Encounter: Payer: Self-pay | Admitting: Internal Medicine

## 2011-01-12 ENCOUNTER — Encounter (INDEPENDENT_AMBULATORY_CARE_PROVIDER_SITE_OTHER): Payer: 59 | Admitting: Internal Medicine

## 2011-01-12 DIAGNOSIS — Z0000 Encounter for general adult medical examination without abnormal findings: Secondary | ICD-10-CM

## 2011-01-12 DIAGNOSIS — I119 Hypertensive heart disease without heart failure: Secondary | ICD-10-CM | POA: Insufficient documentation

## 2011-01-20 NOTE — Assessment & Plan Note (Signed)
Summary: PHYSICAL/NWS   Vital Signs:  Patient profile:   70 year old female Height:      63 inches Weight:      229 pounds BMI:     40.71 Temp:     97.7 degrees F oral Pulse rate:   76 / minute Pulse rhythm:   regular Resp:     16 per minute BP sitting:   130 / 62  (left arm) Cuff size:   large  Vitals Entered By: Jonathon Resides, CMA(AAMA) (January 12, 2011 3:26 PM) CC: CPX Is Patient Diabetic? Yes Comments Pt needs Rf on Allopurinol   CC:  CPX.  History of Present Illness: The patient presents for a preventive health examination   Current Medications (verified): 1)  Furosemide 80 Mg Tabs (Furosemide) .... Take 1/2 Tab By Mouth Once Daily 2)  Klor-Con M20 20 Meq  Tbcr (Potassium Chloride Crys Cr) .Marland Kitchen.. 1 By Mouth Bid 3)  Vitamin B-12 1000 Mcg  Tabs (Cyanocobalamin) .... 1/2 Tab Once Daily 4)  Vitamin D3 1000 Unit  Tabs (Cholecalciferol) .Marland Kitchen.. 1 By Mouth Daily 5)  Coreg 25 Mg Tabs (Carvedilol) .Marland Kitchen.. 1 By Mouth Two Times A Day For Blood Pressure 6)  Pravastatin Sodium 40 Mg Tabs (Pravastatin Sodium) .Marland Kitchen.. 1 By Mouth Once Daily For Cholesterol 7)  Cozaar 100 Mg Tabs (Losartan Potassium) .Marland Kitchen.. 1 By Mouth Qd 8)  Spironolactone 50 Mg Tabs (Spironolactone) .Marland Kitchen.. 1 By Mouth Qam For Blood Pressure 9)  Metformin Hcl 500 Mg Tabs (Metformin Hcl) .Marland Kitchen.. 1 By Mouth Qd For Diabetes 10)  Allopurinol 100 Mg Tabs (Allopurinol) .Marland Kitchen.. 1 By Mouth Once Daily For Gout Prevention  Allergies (verified): No Known Drug Allergies  Past History:  Past Medical History: Last updated: 07/08/2010 Elev. glu Hypertension LE swelling Vit B12 def Hyperlipidemia Obesity CRI  Stage 3 Lactose intol Diarrhea Gout Macular degeneration Diabetes mellitus, type II 2011  Past Surgical History: Last updated: 10/07/2010 Caesarean section Hysterectomy Lumbar laminectomy Cataract extraction Macular repair B  Family History: Last updated: 09/21/2007 Family History Lung cancer  Social History: Last  updated: 09/21/2007 Married Never Smoked Occupation: Automotive engineer  Review of Systems       The patient complains of weight gain, dyspnea on exertion, and difficulty walking.  The patient denies anorexia, fever, weight loss, vision loss, decreased hearing, hoarseness, chest pain, syncope, peripheral edema, prolonged cough, headaches, hemoptysis, abdominal pain, melena, hematochezia, severe indigestion/heartburn, hematuria, incontinence, genital sores, muscle weakness, suspicious skin lesions, transient blindness, unusual weight change, abnormal bleeding, enlarged lymph nodes, angioedema, and breast masses.    Physical Exam  General:  overweight-appearing.   Head:  Normocephalic and atraumatic without obvious abnormalities. No apparent alopecia or balding. Eyes:  No corneal or conjunctival inflammation noted. EOMI. Perrla.  Ears:  External ear exam shows no significant lesions or deformities.  Otoscopic examination reveals clear canals, tympanic membranes are intact bilaterally without bulging, retraction, inflammation or discharge. Hearing is grossly normal bilaterally. Nose:  External nasal examination shows no deformity or inflammation. Nasal mucosa are pink and moist without lesions or exudates. Mouth:  WNL Neck:  No deformities, masses, or tenderness noted. Lungs:  Normal respiratory effort, chest expands symmetrically. Lungs are clear to auscultation, no crackles or wheezes. Heart:  Normal rate and regular rhythm. S1 and S2 normal without gallop, murmur, click, rub or other extra sounds. Abdomen:  Bowel sounds positive,abdomen soft and non-tender without masses, organomegaly or hernias noted. Msk:  LS NT Extremities:  trace right pedal edema.  Neurologic:  No cranial nerve deficits noted. Station and gait are normal. Plantar reflexes are down-going bilaterally. DTRs are symmetrical throughout. Sensory, motor and coordinative functions appear intact. Skin:  Intact without suspicious  lesions or rashes Cervical Nodes:  No lymphadenopathy noted Psych:  Cognition and judgment appear intact. Alert and cooperative with normal attention span and concentration. No apparent delusions, illusions, hallucinations   Impression & Recommendations:  Problem # 1:  ROUTINE GENERAL MEDICAL EXAM@HEALTH  CARE FACL (ICD-V70.0) Assessment New Health and age related issues were discussed. Available screening tests and vaccinations were discussed as well. Healthy life style including good diet and exercise was discussed.  The labs were reviewed with the patient.  Orders: EKG w/ Interpretation (93000)  Problem # 2:  HYPERTENSION (ICD-401.9) Assessment: Unchanged  Her updated medication list for this problem includes:    Furosemide 80 Mg Tabs (Furosemide) .Marland Kitchen... Take 1/2 tab by mouth once daily    Coreg 25 Mg Tabs (Carvedilol) .Marland Kitchen... 1 by mouth two times a day for blood pressure    Cozaar 100 Mg Tabs (Losartan potassium) .Marland Kitchen... 1 by mouth qd    Spironolactone 50 Mg Tabs (Spironolactone) .Marland Kitchen... 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   Complete Medication List: 1)  Furosemide 80 Mg Tabs (Furosemide) .... Take 1/2 tab by mouth once daily 2)  Klor-con M20 20 Meq Tbcr (Potassium chloride crys cr) .Marland Kitchen.. 1 by mouth bid 3)  Vitamin B-12 1000 Mcg Tabs (Cyanocobalamin) .... 1/2 tab once daily 4)  Vitamin D3 1000 Unit Tabs (Cholecalciferol) .Marland Kitchen.. 1 by mouth daily 5)  Coreg 25 Mg Tabs (Carvedilol) .Marland Kitchen.. 1 by mouth two times a day for blood pressure 6)  Cozaar 100 Mg Tabs (Losartan  potassium) .Marland Kitchen.. 1 by mouth qd 7)  Spironolactone 50 Mg Tabs (Spironolactone) .Marland Kitchen.. 1 by mouth qam for blood pressure 8)  Metformin Hcl 500 Mg Tabs (Metformin hcl) .Marland Kitchen.. 1 by mouth qd for diabetes 9)  Lipitor 20 Mg Tabs (Atorvastatin calcium) .Marland Kitchen.. 1 by mouth once daily for cholesterol 10)  Uloric 80 Mg Tabs (Febuxostat) .Marland Kitchen.. 1 by mouth qd  Patient Instructions: 1)  Please schedule a follow-up appointment in 3 months. 2)  BMP prior to visit, ICD-9: 3)  Hepatic Panel prior to visit, ICD-9: 4)  Lipid Panel prior to visit, ICD-9: 5)  HbgA1C prior to visit, ICD-9: 6)  uric acid 401.1 995.20 Prescriptions: ULORIC 80 MG TABS (FEBUXOSTAT) 1 by mouth qd  #30 x 11   Entered and Authorized by:   Cassandria Anger MD   Signed by:   Cassandria Anger MD on 01/12/2011   Method used:   Print then Give to Patient   RxID:   [id number] LIPITOR 20 MG TABS (ATORVASTATIN CALCIUM) 1 by mouth once daily for cholesterol  #30 x 12   Entered and Authorized by:   Cassandria Anger MD   Signed by:   Cassandria Anger MD on 01/12/2011   Method used:   Print then Give to Patient   RxID:   [id number] LIPITOR 20 MG TABS (ATORVASTATIN CALCIUM) 1 by  mouth once daily for cholesterol  #30 x 12   Entered and Authorized by:   Cassandria Anger MD   Signed by:   Cassandria Anger MD on 01/12/2011   Method used:   Print then Give to Patient   RxID:   [id number]    Orders Added: 1)  EKG w/ Interpretation [93000] 2)  Echo Referral [Echo] 3)  Est. Patient age 70&> [id number]

## 2011-01-26 ENCOUNTER — Ambulatory Visit (HOSPITAL_COMMUNITY): Payer: 59 | Attending: Internal Medicine

## 2011-01-26 ENCOUNTER — Encounter: Payer: Self-pay | Admitting: Internal Medicine

## 2011-01-26 DIAGNOSIS — E785 Hyperlipidemia, unspecified: Secondary | ICD-10-CM | POA: Insufficient documentation

## 2011-01-26 DIAGNOSIS — E119 Type 2 diabetes mellitus without complications: Secondary | ICD-10-CM | POA: Insufficient documentation

## 2011-01-26 DIAGNOSIS — I517 Cardiomegaly: Secondary | ICD-10-CM | POA: Insufficient documentation

## 2011-01-26 DIAGNOSIS — I10 Essential (primary) hypertension: Secondary | ICD-10-CM | POA: Insufficient documentation

## 2011-01-28 ENCOUNTER — Encounter: Payer: Self-pay | Admitting: Internal Medicine

## 2011-02-03 NOTE — Miscellaneous (Signed)
Summary: mammogram 2012   Clinical Lists Changes  Observations: Added new observation of MAMMOGRAM: normal (01/26/2011 10:11)      Preventive Care Screening  Mammogram:    Date:  01/26/2011    Results:  normal

## 2011-02-04 ENCOUNTER — Telehealth: Payer: Self-pay | Admitting: Internal Medicine

## 2011-02-10 NOTE — Progress Notes (Signed)
Summary: call  Phone Note Call from Patient Call back at Work Phone [phone]   Caller: Patient Summary of Call: Patient lmovm requesting a call back from Dr Linda Hedges nurse.Ellison Hughs Archie CMA  February 04, 2011 11:36 AM   Follow-up for Phone Call        pt was calling for ECHO results. Informed pt of what do plot. advise Follow-up by: Ami Bullins CMA,  February 04, 2011 1:42 PM

## 2011-02-19 ENCOUNTER — Other Ambulatory Visit: Payer: Self-pay | Admitting: Internal Medicine

## 2011-03-09 ENCOUNTER — Encounter (INDEPENDENT_AMBULATORY_CARE_PROVIDER_SITE_OTHER): Payer: 59 | Admitting: Cardiology

## 2011-03-09 ENCOUNTER — Other Ambulatory Visit: Payer: Self-pay | Admitting: Cardiology

## 2011-03-09 DIAGNOSIS — I6529 Occlusion and stenosis of unspecified carotid artery: Secondary | ICD-10-CM

## 2011-03-11 ENCOUNTER — Encounter: Payer: Self-pay | Admitting: Internal Medicine

## 2011-03-29 ENCOUNTER — Encounter: Payer: Self-pay | Admitting: Internal Medicine

## 2011-03-30 ENCOUNTER — Encounter: Payer: Self-pay | Admitting: Internal Medicine

## 2011-03-30 ENCOUNTER — Other Ambulatory Visit (INDEPENDENT_AMBULATORY_CARE_PROVIDER_SITE_OTHER): Payer: 59

## 2011-03-30 ENCOUNTER — Ambulatory Visit (INDEPENDENT_AMBULATORY_CARE_PROVIDER_SITE_OTHER): Payer: 59 | Admitting: Internal Medicine

## 2011-03-30 ENCOUNTER — Ambulatory Visit (INDEPENDENT_AMBULATORY_CARE_PROVIDER_SITE_OTHER)
Admission: RE | Admit: 2011-03-30 | Discharge: 2011-03-30 | Disposition: A | Discharge status: Home / Self Care | Payer: 59 | Source: Ambulatory Visit | Attending: Internal Medicine | Admitting: Internal Medicine

## 2011-03-30 VITALS — BP 120/78 | HR 79 | Temp 98.0°F | Ht 63.0 in | Wt 233.2 lb

## 2011-03-30 DIAGNOSIS — I10 Essential (primary) hypertension: Secondary | ICD-10-CM

## 2011-03-30 DIAGNOSIS — R109 Unspecified abdominal pain: Secondary | ICD-10-CM | POA: Insufficient documentation

## 2011-03-30 DIAGNOSIS — R0789 Other chest pain: Secondary | ICD-10-CM | POA: Insufficient documentation

## 2011-03-30 DIAGNOSIS — R079 Chest pain, unspecified: Secondary | ICD-10-CM

## 2011-03-30 DIAGNOSIS — M549 Dorsalgia, unspecified: Secondary | ICD-10-CM

## 2011-03-30 LAB — CBC WITH DIFFERENTIAL/PLATELET
Basophils Absolute: 0 10*3/uL (ref 0.0–0.1)
Basophils Relative: 0.7 % (ref 0.0–3.0)
Eosinophils Absolute: 0.2 10*3/uL (ref 0.0–0.7)
Eosinophils Relative: 2.5 % (ref 0.0–5.0)
HCT: 40.4 % (ref 36.0–46.0)
Monocytes Absolute: 0.7 10*3/uL (ref 0.1–1.0)
Monocytes Relative: 10.4 % (ref 3.0–12.0)
Neutro Abs: 2.9 10*3/uL (ref 1.4–7.7)
Neutrophils Relative %: 43.8 % (ref 43.0–77.0)
Platelets: 224 10*3/uL (ref 150.0–400.0)
WBC: 6.6 10*3/uL (ref 4.5–10.5)

## 2011-03-30 LAB — HEPATIC FUNCTION PANEL
AST: 24 U/L (ref 0–37)
Albumin: 3.9 g/dL (ref 3.5–5.2)

## 2011-03-30 LAB — URINALYSIS, ROUTINE W REFLEX MICROSCOPIC
Nitrite: NEGATIVE
Specific Gravity, Urine: 1.02 (ref 1.000–1.030)
Total Protein, Urine: NEGATIVE
Urine Glucose: NEGATIVE
Urobilinogen, UA: 0.2 (ref 0.0–1.0)

## 2011-03-30 LAB — BASIC METABOLIC PANEL
BUN: 23 mg/dL (ref 6–23)
GFR: 38.08 mL/min — ABNORMAL LOW (ref >60.00)
Glucose, Bld: 116 mg/dL — ABNORMAL HIGH (ref 70–99)
Potassium: 4.3 mEq/L (ref 3.5–5.1)

## 2011-03-30 LAB — LIPASE: Lipase: 55 U/L (ref 11.0–59.0)

## 2011-03-30 MED ORDER — CYCLOBENZAPRINE HCL 5 MG PO TABS: 5.0000 mg | ORAL_TABLET | Freq: Three times a day (TID) | ORAL | Status: AC | PRN

## 2011-03-30 MED ORDER — ACETAMINOPHEN-CODEINE 300-30 MG PO TABS: 1.0000 | ORAL_TABLET | Freq: Four times a day (QID) | ORAL | Status: AC | PRN

## 2011-03-30 NOTE — Assessment & Plan Note (Signed)
?   Etiology - ? MSK vs related to above - for tylenol #3 prn, flexeril prn, eval as above, and  to f/u any worsening symptoms or concerns

## 2011-03-30 NOTE — Patient Instructions (Signed)
Take all new medications as prescribed Continue all other medications as before Please go to LAB in the Basement for the blood and/or urine tests to be done today Please go to XRAY in the Basement for the x-ray test Please call the number on the Blue Card (the [place]) for results of testing in 2-3 days You will be contacted regarding the referral for:  Abdomen ultrasound

## 2011-03-30 NOTE — Progress Notes (Signed)
Quick Note:  Voice message left on [employer] system - lab is negative, normal or otherwise stable, pt to continue same tx ______ 

## 2011-03-30 NOTE — Assessment & Plan Note (Signed)
stable overall by hx and exam, most recent lab reviewed with pt, and pt to continue medical treatment as before BP Readings from Last 3 Encounters:  03/30/11 120/78  01/12/11 130/62  10/07/10 120/78

## 2011-03-30 NOTE — Progress Notes (Signed)
Subjective:    Patient ID: Bridget Oliver, female    DOB: [birth year], 70 y.o.   MRN: [id number]  HPI    Here with acute onset right upper back pain near the shoulder blade, for 2 wks, overall mild to mod after being more severe and just eased up in the psat 2 days;  No obvious inciting event to cause MSK strain such as heavy lifing or tight bra with snap irriation. Today not painful to move the right shoulder; no neck or lower back pain,   And no bowel or bladder change, fever, wt loss,  worsening LE pain/numbness/weakness, gait change or falls.   Has been walking more but really more after the right back pain started.  1500 mg ASA did  Not help, nothing makes worse.  Did not try anything else. And no ENT symtpoms such as ST, fever, cough , and Pt denies chest pain, increased sob or doe, wheezing, orthopnea, PND, increased LE swelling, palpitations, dizziness or syncope. Pt denies new neurological symptoms such as new headache, or facial or extremity weakness or numbness.   Pt denies polydipsia, polyuria.    Works as Special educational needs teacher , not sure if the ergonomics not that bad, and only sits for about 30 min at a time.  Non-smoker.  No rash or swelling, pain not pleuritic, and no specific abd pain or blood in urine.  Had some vague discomfort to the lateral chest and abd last wk, wants to r/o GB and any thing else  Past Medical History  Diagnosis Date  . Hypertension   . Hyperlipidemia   . Diabetes mellitus   . Hyperglycemia   . Swelling of lower limb   . Obesity   . CRI (chronic renal insufficiency)     Stage 3  . Lactose intolerance   . Diarrhea   . Gout   . Macular degeneration    Past Surgical History  Procedure Date  . Cesarean section   . Abdominal hysterectomy   . Lumbar laminectomy   . Cataract extraction   . Pars plana vitrectomy w/ repair of macular hole     Bilateral    reports that she has never smoked. She does not have any smokeless tobacco history on file. Her alcohol and drug  histories not on file. family history includes Cancer in her other. No Known Allergies Current Outpatient Prescriptions on File Prior to Visit  Medication Sig Dispense Refill  . carvedilol (COREG) 25 MG tablet Take 25 mg by mouth 2 (two) times daily with a meal.        . Cholecalciferol (VITAMIN D3) 1000 UNITS CAPS Take 1 capsule by mouth daily.        . furosemide (LASIX) 80 MG tablet Take 40 mg by mouth daily.        Marland Kitchen KLOR-CON M20 20 MEQ tablet TAKE 1 TABLET BY MOUTH TWICE A DAY  180 tablet  2  . metFORMIN (GLUCOPHAGE) 500 MG tablet Take 500 mg by mouth daily.        Marland Kitchen spironolactone (ALDACTONE) 50 MG tablet Take 50 mg by mouth every morning.        . vitamin B-12 (CYANOCOBALAMIN) 1000 MCG tablet Take 500 mcg by mouth daily.        Marland Kitchen atorvastatin (LIPITOR) 20 MG tablet Take 20 mg by mouth daily.        . Febuxostat (ULORIC) 80 MG TABS Take 1 tablet by mouth daily.        Marland Kitchen  losartan (COZAAR) 100 MG tablet Take 100 mg by mouth daily.          Review of Systems All otherwise neg per pt     Objective:   Physical Exam BP 120/78  Pulse 79  Temp(Src) 98 F (36.7 C) (Oral)  Ht 5\' 3"  (1.6 m)  Wt 233 lb 4 oz (105.802 kg)  BMI 41.32 kg/m2  SpO2 96% Physical Exam  VS noted Constitutional: Pt appears well-developed and well-nourished.  HENT: Head: Normocephalic.  Right Ear: External ear normal.  Left Ear: External ear normal.  Eyes: Conjunctivae and EOM are normal. Pupils are equal, round, and reactive to light.  Neck: Normal range of motion. Neck supple.  Cardiovascular: Normal rate and regular rhythm.   Pulmonary/Chest: Effort normal and breath sounds normal.  Abd:  Soft, NT, non-distended, + BS Neurological: Pt is alert. No cranial nerve deficit. motor gross intact Skin: Skin is warm. No erythema.  Psychiatric: Pt behavior is normal. Thought content normal.  Mild tender to just under the right periscapular area with pt upright No erythema, swelling, rash       Assessment &  Plan:

## 2011-03-30 NOTE — Assessment & Plan Note (Signed)
Vague right lateral - ? MSK vs other - for cxr today,  to f/u any worsening symptoms or concerns

## 2011-03-30 NOTE — Assessment & Plan Note (Addendum)
Somewhat vague right side, improved overall, but still somwhat concerning - for UA , cbc, LFT's lipase, and  to f/u any worsening symptoms or concerns

## 2011-04-01 ENCOUNTER — Ambulatory Visit
Admission: RE | Admit: 2011-04-01 | Discharge: 2011-04-01 | Disposition: A | Discharge status: Home / Self Care | Payer: 59 | Source: Ambulatory Visit | Attending: Internal Medicine | Admitting: Internal Medicine

## 2011-04-01 DIAGNOSIS — R109 Unspecified abdominal pain: Secondary | ICD-10-CM

## 2011-04-02 NOTE — Progress Notes (Signed)
Quick Note:  Voice message left on [employer] system - lab is negative, normal or otherwise stable, pt to continue same tx ______ 

## 2011-04-07 NOTE — Assessment & Plan Note (Signed)
Bridget Oliver                         GASTROENTEROLOGY OFFICE NOTE   Bridget Oliver, Bridget Oliver                          MRN:          [id number]  DATE:04/12/2007                            DOB:          09/27/1941    REFERRING PHYSICIAN:  [name]. [name], MD   REASON FOR REFERRAL:  Abnormal liver function test.   HISTORY OF PRESENT ILLNESS:  Bridget Oliver is a 70 year old white female,  referred through the courtesy of Dr. Tyrone Apple Oliver.  I had  previously evaluated her with a screening colonoscopy in October of  2003, which showed sigmoid colon diverticulosis.  She has had several  sets of liver function tests over the past two years that have all been  normal until January of this year.  Her AST was 86 and ALT was 110 with  a total bilirubin of 1.5.  The remainder of the liver function tests  were normal.  Her blood work was repeated on March 15, 2007, with an AST  of 83, ALT of 92 and the remainder of the liver function tests were  entirely normal.  Her iron was normal at 45.  She relates intermittent  problems with gas and bloating and rare episodes of heartburn.  She has  no history of liver disease, hepatitis or jaundice or exposure to people  with hepatitis or jaundice.  She denies any blood transfusions, IV drug  usage, inadvertent needle sticks or heavy alcohol usage.  There is no  family history of liver disease.  She recently had an abdominal  ultrasound performed on March 19, which showed a mild increase in  echodensity, consistent with fatty infiltration.   There is no family history of colon cancer, colon polyps or inflammatory  bowel disease.   She notes no medication changes in the few months prior to her elevated  liver function test.  Vytorin was discontinued by Dr. Alain Marion earlier  this calendar year.   PAST MEDICAL HISTORY:  Hypertension, diverticulosis, obesity,  hyperlipidemia, lower extremity edema, status post partial  hysterectomy,  status post C-sections, status post back surgery.   CURRENT MEDICATIONS:  Listed on the chart- updated and reviewed.   MEDICATION ALLERGIES:  None known.   SOCIAL HISTORY:  Per the handwritten form.   REVIEW OF SYSTEMS:  Per the handwritten form.   PHYSICAL EXAM:  Obese, white female, in no acute distress.  Height 5  feet 3 inches, weight 226.4 pounds.  Blood pressure is 116/58, pulse 72  and regular.  HEENT EXAM:  Anicteric sclerae.  Oropharynx clear.  CHEST:  Clear to auscultation bilaterally.  CARDIAC:  Regular rate and rhythm without murmurs appreciated.  ABDOMEN:  Soft, nontender, nondistended.  Normoactive bowel sounds.  No  palpable organomegaly, masses or hernias.  RECTAL EXAMINATION:  Deferred.  EXTREMITIES:  Without clubbing, cyanosis or edema.  NEUROLOGIC:  Alert and oriented times three, grossly nonfocal.   ASSESSMENT AND PLAN:  Abnormal transaminases, likely secondary to fatty  infiltration of the liver.  We will obtain standard blood tests for  chronic viral hepatitis, metabolic and genetic liver diseases.  She is  advised to maintain a long term, low fat diet with a weight loss program  under the supervision of Dr. Alain Marion.  It is reasonable to follow her  liver function tests at six-month intervals.  If her liver function  tests significantly increase, we could perform a liver biopsy for  further evaluation.     Pricilla Riffle. Fuller Plan, MD, [facility]  Electronically Signed    MTS/MedQ  DD: 04/13/2007  DT: 04/13/2007  Job #: [id number]

## 2011-04-09 ENCOUNTER — Other Ambulatory Visit: Payer: Self-pay | Admitting: Internal Medicine

## 2011-04-09 ENCOUNTER — Other Ambulatory Visit (INDEPENDENT_AMBULATORY_CARE_PROVIDER_SITE_OTHER): Payer: 59

## 2011-04-09 DIAGNOSIS — I10 Essential (primary) hypertension: Secondary | ICD-10-CM

## 2011-04-09 DIAGNOSIS — Z13220 Encounter for screening for lipoid disorders: Secondary | ICD-10-CM

## 2011-04-09 DIAGNOSIS — T887XXA Unspecified adverse effect of drug or medicament, initial encounter: Secondary | ICD-10-CM

## 2011-04-09 LAB — LIPID PANEL
HDL: 32 mg/dL — ABNORMAL LOW (ref >39.00)
Total CHOL/HDL Ratio: 7
Triglycerides: 278 mg/dL — ABNORMAL HIGH (ref 0.0–149.0)

## 2011-04-09 LAB — HEPATIC FUNCTION PANEL
ALT: 20 U/L (ref 0–35)
Alkaline Phosphatase: 63 U/L (ref 39–117)
Bilirubin, Direct: 0 mg/dL (ref 0.0–0.3)
Total Bilirubin: 0.7 mg/dL (ref 0.3–1.2)

## 2011-04-09 LAB — BASIC METABOLIC PANEL
BUN: 29 mg/dL — ABNORMAL HIGH (ref 6–23)
CO2: 26 mEq/L (ref 19–32)
Calcium: 9.2 mg/dL (ref 8.4–10.5)
Creatinine, Ser: 1.6 mg/dL — ABNORMAL HIGH (ref 0.4–1.2)

## 2011-04-09 LAB — HEMOGLOBIN A1C: Hgb A1c MFr Bld: 6.7 % — ABNORMAL HIGH (ref 4.6–6.5)

## 2011-04-09 LAB — URIC ACID: Uric Acid, Serum: 10.7 mg/dL — ABNORMAL HIGH (ref 2.4–7.0)

## 2011-04-10 NOTE — H&P (Signed)
[place]  Patient:    Bridget Oliver, Bridget Oliver Visit Number: [id number] MRN: [id number]          Service Type: SUR Location: [id number] Attending Physician:  Melton Krebs Dictated by:   Elizabeth Sauer, M.D. Admit Date:  02/10/2002 Discharge Date: 02/11/2002                           History and Physical  ADMITTING DIAGNOSIS:  Herniated disk at L4-5.  SERVICE:  Neurosurgery.  HISTORY OF PRESENT ILLNESS:  [age]-year-old right-handed black lady who a couple of weeks ago had the onset of back pain with pain radiating down her right leg, progressively worsened and went to the emergency room Sunday.  The MRI showed a large disk at 4-5, eccentric to the right side.  She was referred to me by Dr. Mylinda Latina III.  PAST MEDICAL HISTORY:  Medical history is remarkable for hypertension.  MEDICATIONS: 1. Lasix 40 mg a day. 2. Potassium 20 mEq once a day. 3. Labetalol 200 mg twice a day. 4. Doxazosin 8 mg once a day. 5. Lotrel 10/20 mg once a day. 6. Avalide 300/12.5 mg once a day. 7. She uses Phenergan on a p.r.n. basis when she has been using Tylox. 8. She uses Premarin 0.625 mg a day.  ALLERGIES:  She has no allergies.  SURGICAL HISTORY:  A myomectomy 25 years ago and hysterectomy 20 years ago.  SOCIAL HISTORY:  She neither smokes nor drinks and is an Counsellor for [employer].  FAMILY HISTORY:  Parents are deceased; history is not given.  REVIEW OF SYSTEMS:  Remarkable for hypertension, leg pain, back pain and nausea.  PHYSICAL EXAMINATION:  HEENT:  Within normal limits.  NECK:  She has good range of motion of her neck.  CHEST:  Clear.  CARDIAC:  Regular rate and rhythm.  ABDOMEN:  Nontender with no hepatosplenomegaly.  EXTREMITIES:  Without clubbing or cyanosis.  GU:  Deferred.  PERIPHERAL PULSES:  Good.  NEUROLOGIC:  She is awake, alert and oriented.  Her cranial nerves are intact. Motor exam shows 5/5  strength throughout the upper and lower extremities. Sensory deficit is described in the right L5 distribution.  Reflexes are 2 at the right knee, 2 at the right ankle, absent at the left knee and absent at the left ankle.  Reversed straight leg raise is negative.  Straight leg raise is positive on the right side.  LABORATORY AND ACCESSORY DATA:  She comes accompanied with an MRI that shows some disk disease at 5-1 but a large disk at 4-5, eccentric to the right with elevation of the right L5 root.  CLINICAL IMPRESSION:  Right L5 radiculopathy secondary to herniated disk at L4-5.  She underwent a steroid injection to no avail and presents now for a lumbar laminectomy and diskectomy.  The risks and benefits of this approach have been discussed with her and she wishes to proceed. Dictated by:   Elizabeth Sauer, M.D. Attending Physician:  Melton Krebs DD:  02/10/02 TD:  02/11/02 Job: [id number]

## 2011-04-10 NOTE — Op Note (Signed)
[place]  Patient:    Bridget Oliver, Bridget Oliver Visit Number: [id number] MRN: [id number]          Service Type: SUR Location: [id number] Attending Physician:  Melton Krebs Dictated by:   Elizabeth Sauer, M.D. Proc. Date: 02/10/02 Admit Date:  02/10/2002 Discharge Date: 02/11/2002                             Operative Report  PREOPERATIVE DIAGNOSIS:  Herniated disk at L4-5 on the right.  POSTOPERATIVE DIAGNOSIS:  Herniated disk at L4-5 on the right.  PROCEDURE:  L4-5 laminectomy and diskectomy.  SURGEON:  Elizabeth Sauer, M.D.  ANESTHESIA:  General endotracheal.  PREPARATION:  Prepped and draped with alcohol wipe.  COMPLICATIONS:  None.  NURSE ASSISTANT:  [place].  PHYSICIAN ASSISTANT:  Ophelia Charter, M.D.  INDICATIONS:  This 70 year old, right-handed black lady with right L4-5 disk.  DESCRIPTION OF PROCEDURE:  She was taken to the operating room and smoothly anesthetized and intubated and placed prone on the operating room table. Following a shave, prep, and drape in the usual sterile fashion, the skin was infiltrated with 1% lidocaine, 1:400,000 epinephrine.  The skin was incised from the bottom of L3 to the bottom of L5, and the lamina of L3, L4, and L5 were exposed on the right side in the subperiosteal plane.  An intraoperative x-ray confirmed correctness of the level.  A hemisemilaminectomy of L4 was carried out to the top of the ligament of flavum, that was removed in a retrograde fashion.  A partial superior hemilaminectomy of L5 was carried out. The L5 root was isolated and a foraminotomy carried out over top of it. Inferior to the L5 root and its axilla, the dura was carefully dissected from the anterior epidural space.  It was quite tight because of the disk that was underneath it.  Working from both above and below the root, the posterior longitudinal ligament was divided and the disk removed without difficulty. Careful  inspection of the nerve root revealed that it has been significantly compressed.  It had some bruising on it, but appeared to be intact.  The anterior epidural space was carefully explored and found to be open.  The disk space was evacuated of all graspable fragments.  The wound was irrigated and hemostasis assured.  Depo-Medrol sub-fat was used to fill the laminectomy defect.  The fascia was reapproximated with #0 Vicryl in an interrupted fashion.  The subcutaneous tissues were reapproximated with #0 Vicryl in an interrupted fashion.  The subcuticular tissues were approximated with #3-0 Vicryl in an interrupted fashion.  The skin was closed with #3-0 nylon in a running lock fashion.  Bacitracin and Telfa dressing were applied and made occlusive with Op-Site.  The patient was returned to the recovery room in good condition. Dictated by:   Elizabeth Sauer, M.D. Attending Physician:  Melton Krebs DD:  02/10/02 TD:  02/13/02 Job: [id number]

## 2011-04-15 ENCOUNTER — Encounter: Payer: Self-pay | Admitting: Internal Medicine

## 2011-04-16 ENCOUNTER — Encounter: Payer: Self-pay | Admitting: Internal Medicine

## 2011-04-16 ENCOUNTER — Ambulatory Visit (INDEPENDENT_AMBULATORY_CARE_PROVIDER_SITE_OTHER): Payer: 59 | Admitting: Internal Medicine

## 2011-04-16 DIAGNOSIS — N289 Disorder of kidney and ureter, unspecified: Secondary | ICD-10-CM

## 2011-04-16 DIAGNOSIS — E119 Type 2 diabetes mellitus without complications: Secondary | ICD-10-CM

## 2011-04-16 DIAGNOSIS — E785 Hyperlipidemia, unspecified: Secondary | ICD-10-CM

## 2011-04-16 DIAGNOSIS — E538 Deficiency of other specified B group vitamins: Secondary | ICD-10-CM

## 2011-04-16 MED ORDER — FEBUXOSTAT 80 MG PO TABS: 1.0000 | ORAL_TABLET | Freq: Every day | ORAL | Status: DC

## 2011-04-16 MED ORDER — LOSARTAN POTASSIUM 100 MG PO TABS: 100.0000 mg | ORAL_TABLET | Freq: Every day | ORAL | Status: DC

## 2011-04-16 MED ORDER — ATORVASTATIN CALCIUM 20 MG PO TABS: 20.0000 mg | ORAL_TABLET | Freq: Every day | ORAL | Status: DC

## 2011-04-16 NOTE — Assessment & Plan Note (Signed)
Risks associated with treatment noncompliance were discussed. Compliance was encouraged. Restart Rx

## 2011-04-16 NOTE — Assessment & Plan Note (Signed)
On Rx 

## 2011-04-16 NOTE — Assessment & Plan Note (Signed)
Watching labs 

## 2011-04-16 NOTE — Progress Notes (Signed)
  Subjective:    Patient ID: Bridget Oliver, female    DOB: [birth year], 70 y.o.   MRN: [id number]  HPI  The patient presents for a follow-up of  chronic hypertension, chronic dyslipidemia, type 2 diabetes controlled with medicines and gout. She did not get Rx for Uloric and Lipitor   Review of Systems  Constitutional: Negative for appetite change and unexpected weight change.  HENT: Negative for sinus pressure.   Eyes: Negative for redness.  Cardiovascular: Negative for chest pain.  Genitourinary: Negative for dysuria.  Musculoskeletal: Negative for back pain and joint swelling.  Skin: Negative for pallor and rash.  Neurological: Negative for syncope and weakness.  Psychiatric/Behavioral: Negative for dysphoric mood.   Wt Readings from Last 3 Encounters:  04/16/11 232 lb (105.235 kg)  03/30/11 233 lb 4 oz (105.802 kg)  01/12/11 229 lb (103.874 kg)      Objective:   Physical Exam  Constitutional: She appears well-developed and well-nourished. No distress.       Obese  HENT:  Head: Normocephalic.  Right Ear: External ear normal.  Left Ear: External ear normal.  Nose: Nose normal.  Mouth/Throat: Oropharynx is clear and moist.  Eyes: Conjunctivae are normal. Pupils are equal, round, and reactive to light. Right eye exhibits no discharge. Left eye exhibits no discharge.  Neck: Normal range of motion. Neck supple. No JVD present. No tracheal deviation present. No thyromegaly present.  Cardiovascular: Normal rate, regular rhythm and normal heart sounds.   Pulmonary/Chest: No stridor. No respiratory distress. She has no wheezes.  Abdominal: Soft. Bowel sounds are normal. She exhibits no distension and no mass. There is no tenderness. There is no rebound and no guarding.  Musculoskeletal: She exhibits no edema and no tenderness.  Lymphadenopathy:    She has no cervical adenopathy.  Neurological: She displays normal reflexes. No cranial nerve deficit. She exhibits normal muscle tone.  Coordination normal.  Skin: No rash noted. No erythema.  Psychiatric: She has a normal mood and affect. Her behavior is normal. Judgment and thought content normal.        Lab Results  Component Value Date   WBC 6.6 03/30/2011   HGB 13.9 03/30/2011   HCT 40.4 03/30/2011   PLT 224.0 03/30/2011   CHOL 235* 04/09/2011   TRIG 278.0* 04/09/2011   HDL 32.00* 04/09/2011   LDLDIRECT 150.2 04/09/2011   ALT 20 04/09/2011   AST 20 04/09/2011   NA 138 04/09/2011   K 4.3 04/09/2011   CL 104 04/09/2011   CREATININE 1.6* 04/09/2011   BUN 29* 04/09/2011   CO2 26 04/09/2011   TSH 2.65 01/07/2011   INR 0.9 RATIO 04/12/2007   HGBA1C 6.7* 04/09/2011     Assessment & Plan:  RENAL INSUFFICIENCY Watching labs  DIABETES MELLITUS, TYPE II On Rx  B12 DEFICIENCY On Rx  HYPERLIPIDEMIA Risks associated with treatment noncompliance were discussed. Compliance was encouraged. Restart Rx     GOUT  Restart Rx

## 2011-05-06 ENCOUNTER — Encounter: Payer: Self-pay | Admitting: Internal Medicine

## 2011-05-07 ENCOUNTER — Ambulatory Visit (INDEPENDENT_AMBULATORY_CARE_PROVIDER_SITE_OTHER): Payer: 59 | Admitting: Internal Medicine

## 2011-05-07 ENCOUNTER — Encounter: Payer: Self-pay | Admitting: Internal Medicine

## 2011-05-07 DIAGNOSIS — M7030 Other bursitis of elbow, unspecified elbow: Secondary | ICD-10-CM

## 2011-05-07 DIAGNOSIS — M79641 Pain in right hand: Secondary | ICD-10-CM | POA: Insufficient documentation

## 2011-05-07 DIAGNOSIS — M79609 Pain in unspecified limb: Secondary | ICD-10-CM

## 2011-05-07 DIAGNOSIS — M7020 Olecranon bursitis, unspecified elbow: Secondary | ICD-10-CM

## 2011-05-07 DIAGNOSIS — M109 Gout, unspecified: Secondary | ICD-10-CM

## 2011-05-07 MED ORDER — MELOXICAM 7.5 MG PO TABS: ORAL_TABLET | ORAL | Status: DC

## 2011-05-07 MED ORDER — METHYLPREDNISOLONE ACETATE 20 MG/ML IJ SUSP: 20.0000 mg | Freq: Once | INTRAMUSCULAR | Status: DC

## 2011-05-07 NOTE — Assessment & Plan Note (Signed)
?   Contusion Meloxicam was given

## 2011-05-07 NOTE — Progress Notes (Signed)
  Subjective:    Patient ID: Bridget Oliver, female    DOB: Mar 20, 1941, 70 y.o.   MRN: [id number]  HPI  C/o R elbow swelling w/o pain x2 wks or longer C/o R lat hand pain x 1 wk  Review of Systems  Constitutional: Negative for fever, chills, activity change, appetite change, fatigue and unexpected weight change.  HENT: Negative for congestion, mouth sores and sinus pressure.   Eyes: Negative for visual disturbance.  Respiratory: Negative for cough and chest tightness.   Cardiovascular: Negative for leg swelling.  Gastrointestinal: Negative for nausea and abdominal pain.  Genitourinary: Negative for frequency, difficulty urinating and vaginal pain.  Musculoskeletal: Negative for back pain, joint swelling, arthralgias and gait problem.       As above  Skin: Negative for pallor and rash.  Neurological: Negative for dizziness, tremors, weakness, numbness and headaches.  Psychiatric/Behavioral: Negative for confusion and sleep disturbance.       Objective:   Physical Exam  Constitutional: She appears well-developed and well-nourished. No distress.       Obese  HENT:  Head: Normocephalic.  Right Ear: External ear normal.  Left Ear: External ear normal.  Nose: Nose normal.  Mouth/Throat: Oropharynx is clear and moist.  Eyes: Conjunctivae are normal. Pupils are equal, round, and reactive to light. Right eye exhibits no discharge. Left eye exhibits no discharge.  Neck: Normal range of motion. Neck supple. No JVD present. No tracheal deviation present. No thyromegaly present.  Cardiovascular: Normal rate, regular rhythm and normal heart sounds.   Pulmonary/Chest: No stridor. No respiratory distress. She has no wheezes.  Abdominal: Soft. Bowel sounds are normal. She exhibits no distension and no mass. There is no tenderness. There is no rebound and no guarding.  Musculoskeletal: She exhibits edema (smollen R elbow posteriorly) and tenderness (R ulnar hand wih some sensitivity to palpation).    Lymphadenopathy:    She has no cervical adenopathy.  Neurological: She displays normal reflexes. No cranial nerve deficit. She exhibits normal muscle tone. Coordination normal.  Skin: No rash noted. No erythema.  Psychiatric: She has a normal mood and affect. Her behavior is normal. Judgment and thought content normal.         R elbow is swollen over bursa Assessment & Plan:  Procedure Note :    Procedure :   Sonography examination R elbow, limited   Indication: R elbow swelling    Equipment used: Sonosite M-Turbo HFL38x/13-6 MHz transducer linear probe. The images were stored in the unit and later transferred in storage.  The patient was placed in a sitting position.   This study revealed a hypoechotic fluid filled   Bursa with some irregular shaped hyperechotic irregularities of the bursa   Impression: R elbow bursitis

## 2011-05-07 NOTE — Assessment & Plan Note (Signed)
No classical sx's now

## 2011-05-07 NOTE — Patient Instructions (Signed)
Call if problems 

## 2011-05-07 NOTE — Assessment & Plan Note (Signed)
Options to treat were discussed. She elected aspiration/steroids   Procedure Note :    Procedure :Elbow  bursa aspiration and steroid injection   Indication: Bursitis with refractory  chronic pain, R.   Risks including unsuccessful procedure , bleeding, infection, bruising, skin atrophy and others were explained to the patient in detail as well as the benefits. Informed consent was obtained and signed.   Tthe patient was placed in a comfortable position. Skin was prepped with Betadine and alcohol  and anesthetized with a cooling spray. Then, a 3 cc syringe with a 1 inch long 25-gauge needle was used for a skin over bursa injection 1 mL of 2% lidocaine. 21 gauge 1 inch needle on a 10 ml syringe was used to aspirate 10 ml of reddish sero-sanguinous fluid (discarded). Then, 20 mg of Depo-Medrol and 1 cc Lido was injected in the bursa .  Band-Aid was applied. ACE wrap.   Tolerated well. Complications: None. Good pain relief following the procedure.   Postprocedure instructions :    A Band-Aid should be left on for 12 hours. Injection therapy is not a cure itself. It is used in conjunction with other modalities. You can use nonsteroidal anti-inflammatories like ibuprofen , hot and cold compresses. Rest is recommended in the next 24 hours. You need to report immediately  if fever, chills or any signs of infection develop.

## 2011-06-17 ENCOUNTER — Other Ambulatory Visit: Payer: Self-pay | Admitting: Internal Medicine

## 2011-06-26 ENCOUNTER — Other Ambulatory Visit: Payer: Self-pay | Admitting: Internal Medicine

## 2011-06-30 ENCOUNTER — Other Ambulatory Visit: Payer: Self-pay | Admitting: Internal Medicine

## 2011-07-13 ENCOUNTER — Other Ambulatory Visit: Payer: Self-pay | Admitting: Internal Medicine

## 2011-07-14 ENCOUNTER — Other Ambulatory Visit: Payer: Self-pay | Admitting: Internal Medicine

## 2011-07-14 ENCOUNTER — Other Ambulatory Visit (INDEPENDENT_AMBULATORY_CARE_PROVIDER_SITE_OTHER): Payer: 59

## 2011-07-14 DIAGNOSIS — N289 Disorder of kidney and ureter, unspecified: Secondary | ICD-10-CM

## 2011-07-14 DIAGNOSIS — E538 Deficiency of other specified B group vitamins: Secondary | ICD-10-CM

## 2011-07-14 DIAGNOSIS — E119 Type 2 diabetes mellitus without complications: Secondary | ICD-10-CM

## 2011-07-14 DIAGNOSIS — E785 Hyperlipidemia, unspecified: Secondary | ICD-10-CM

## 2011-07-14 LAB — COMPREHENSIVE METABOLIC PANEL
ALT: 25 U/L (ref 0–35)
Alkaline Phosphatase: 64 U/L (ref 39–117)
Glucose, Bld: 104 mg/dL — ABNORMAL HIGH (ref 70–99)
Sodium: 141 mEq/L (ref 135–145)
Total Bilirubin: 0.5 mg/dL (ref 0.3–1.2)
Total Protein: 6.7 g/dL (ref 6.0–8.3)

## 2011-07-14 LAB — LIPID PANEL
Cholesterol: 233 mg/dL — ABNORMAL HIGH (ref 0–200)
Total CHOL/HDL Ratio: 6
VLDL: 59.4 mg/dL — ABNORMAL HIGH (ref 0.0–40.0)

## 2011-07-14 LAB — LDL CHOLESTEROL, DIRECT: Direct LDL: 127.9 mg/dL

## 2011-07-15 LAB — VITAMIN D 25 HYDROXY (VIT D DEFICIENCY, FRACTURES): Vit D, 25-Hydroxy: 61 ng/mL (ref 30–89)

## 2011-07-17 ENCOUNTER — Encounter: Payer: Self-pay | Admitting: Internal Medicine

## 2011-07-17 ENCOUNTER — Ambulatory Visit (INDEPENDENT_AMBULATORY_CARE_PROVIDER_SITE_OTHER): Payer: 59 | Admitting: Internal Medicine

## 2011-07-17 ENCOUNTER — Other Ambulatory Visit: Payer: Self-pay | Admitting: Internal Medicine

## 2011-07-17 DIAGNOSIS — E538 Deficiency of other specified B group vitamins: Secondary | ICD-10-CM

## 2011-07-17 DIAGNOSIS — E119 Type 2 diabetes mellitus without complications: Secondary | ICD-10-CM

## 2011-07-17 DIAGNOSIS — E785 Hyperlipidemia, unspecified: Secondary | ICD-10-CM

## 2011-07-17 DIAGNOSIS — N289 Disorder of kidney and ureter, unspecified: Secondary | ICD-10-CM

## 2011-07-17 DIAGNOSIS — Z1211 Encounter for screening for malignant neoplasm of colon: Secondary | ICD-10-CM

## 2011-07-17 MED ORDER — ATORVASTATIN CALCIUM 40 MG PO TABS: 40.0000 mg | ORAL_TABLET | Freq: Every day | ORAL | Status: DC

## 2011-07-17 NOTE — Progress Notes (Signed)
  Subjective:    Patient ID: Bridget Oliver, female    DOB: 12-21-1940, 70 y.o.   MRN: [id number]  HPI  The patient is here for a wellness exam. The patient has been doing well overall without major physical or psychological issues going on lately. The patient needs to address  chronic hypertension that has been well controlled with medicines; to address chronic  hyperlipidemia controlled with medicines as well; and to address type 2 chronic diabetes, controlled with medical treatment and diet.   Review of Systems  Constitutional: Negative for chills, activity change, appetite change, fatigue and unexpected weight change.  HENT: Negative for congestion, mouth sores and sinus pressure.   Eyes: Negative for visual disturbance.  Respiratory: Negative for cough and chest tightness.   Gastrointestinal: Negative for nausea and abdominal pain.  Genitourinary: Negative for frequency, difficulty urinating and vaginal pain.  Musculoskeletal: Negative for back pain and gait problem.  Skin: Negative for pallor and rash.  Neurological: Negative for dizziness, tremors, weakness, numbness and headaches.  Psychiatric/Behavioral: Negative for confusion and sleep disturbance.       Objective:   Physical Exam  Constitutional: She appears well-developed. No distress.       Obese  HENT:  Head: Normocephalic.  Right Ear: External ear normal.  Left Ear: External ear normal.  Nose: Nose normal.  Mouth/Throat: Oropharynx is clear and moist.  Eyes: Conjunctivae are normal. Pupils are equal, round, and reactive to light. Right eye exhibits no discharge. Left eye exhibits no discharge.  Neck: Normal range of motion. Neck supple. No JVD present. No tracheal deviation present. No thyromegaly present.  Cardiovascular: Normal rate, regular rhythm and normal heart sounds.   Pulmonary/Chest: No stridor. No respiratory distress. She has no wheezes.  Abdominal: Soft. Bowel sounds are normal. She exhibits no distension  and no mass. There is no tenderness. There is no rebound and no guarding.  Musculoskeletal: She exhibits no edema and no tenderness.  Lymphadenopathy:    She has no cervical adenopathy.  Neurological: She displays normal reflexes. No cranial nerve deficit. She exhibits normal muscle tone. Coordination normal.  Skin: No rash noted. No erythema.  Psychiatric: She has a normal mood and affect. Her behavior is normal. Judgment and thought content normal.    Lab Results  Component Value Date   WBC 6.6 03/30/2011   HGB 13.9 03/30/2011   HCT 40.4 03/30/2011   PLT 224.0 03/30/2011   CHOL 233* 07/14/2011   TRIG 297.0* 07/14/2011   HDL 35.90* 07/14/2011   LDLDIRECT 127.9 07/14/2011   ALT 25 07/14/2011   AST 19 07/14/2011   NA 141 07/14/2011   K 4.2 07/14/2011   CL 107 07/14/2011   CREATININE 1.9* 07/14/2011   BUN 35* 07/14/2011   CO2 27 07/14/2011   TSH 2.65 01/07/2011   INR 0.9 RATIO 04/12/2007   HGBA1C 6.7* 04/09/2011         Assessment & Plan:

## 2011-07-17 NOTE — Assessment & Plan Note (Signed)
Worse - will increase Lipitor

## 2011-07-17 NOTE — Assessment & Plan Note (Signed)
Monitoring labs 

## 2011-07-17 NOTE — Assessment & Plan Note (Signed)
On Rx 

## 2011-07-17 NOTE — Assessment & Plan Note (Signed)
Wt Readings from Last 3 Encounters:  07/17/11 234 lb (106.142 kg)  05/07/11 235 lb (106.595 kg)  04/16/11 232 lb (105.235 kg)

## 2011-09-04 ENCOUNTER — Other Ambulatory Visit: Payer: Self-pay | Admitting: Internal Medicine

## 2011-11-04 ENCOUNTER — Other Ambulatory Visit: Payer: Self-pay | Admitting: Internal Medicine

## 2011-11-05 ENCOUNTER — Other Ambulatory Visit (INDEPENDENT_AMBULATORY_CARE_PROVIDER_SITE_OTHER): Payer: 59

## 2011-11-05 DIAGNOSIS — E785 Hyperlipidemia, unspecified: Secondary | ICD-10-CM

## 2011-11-05 DIAGNOSIS — E119 Type 2 diabetes mellitus without complications: Secondary | ICD-10-CM

## 2011-11-05 DIAGNOSIS — N289 Disorder of kidney and ureter, unspecified: Secondary | ICD-10-CM

## 2011-11-05 LAB — COMPREHENSIVE METABOLIC PANEL
AST: 21 U/L (ref 0–37)
Alkaline Phosphatase: 78 U/L (ref 39–117)
BUN: 41 mg/dL — ABNORMAL HIGH (ref 6–23)
Calcium: 9.5 mg/dL (ref 8.4–10.5)
Creatinine, Ser: 1.9 mg/dL — ABNORMAL HIGH (ref 0.4–1.2)
Glucose, Bld: 107 mg/dL — ABNORMAL HIGH (ref 70–99)

## 2011-11-05 LAB — LIPID PANEL
Cholesterol: 148 mg/dL (ref 0–200)
HDL: 31.2 mg/dL — ABNORMAL LOW (ref >39.00)
LDL Cholesterol: 83 mg/dL (ref 0–99)
Total CHOL/HDL Ratio: 5
Triglycerides: 170 mg/dL — ABNORMAL HIGH (ref 0.0–149.0)

## 2011-11-10 ENCOUNTER — Encounter: Payer: Self-pay | Admitting: Internal Medicine

## 2011-11-10 ENCOUNTER — Ambulatory Visit (INDEPENDENT_AMBULATORY_CARE_PROVIDER_SITE_OTHER): Payer: 59 | Admitting: Internal Medicine

## 2011-11-10 VITALS — BP 118/70 | HR 72 | Temp 97.5°F | Resp 16 | Wt 229.0 lb

## 2011-11-10 DIAGNOSIS — Z23 Encounter for immunization: Secondary | ICD-10-CM

## 2011-11-10 DIAGNOSIS — E119 Type 2 diabetes mellitus without complications: Secondary | ICD-10-CM

## 2011-11-10 DIAGNOSIS — N289 Disorder of kidney and ureter, unspecified: Secondary | ICD-10-CM

## 2011-11-10 DIAGNOSIS — I10 Essential (primary) hypertension: Secondary | ICD-10-CM

## 2011-11-10 DIAGNOSIS — M109 Gout, unspecified: Secondary | ICD-10-CM

## 2011-11-10 DIAGNOSIS — E538 Deficiency of other specified B group vitamins: Secondary | ICD-10-CM

## 2011-11-10 NOTE — Assessment & Plan Note (Signed)
better 

## 2011-11-10 NOTE — Assessment & Plan Note (Signed)
Continue with current prescription therapy as reflected on the Med list.  

## 2011-11-10 NOTE — Assessment & Plan Note (Signed)
Continue with current prescription therapy as reflected on the Med list. Stable 

## 2011-11-10 NOTE — Assessment & Plan Note (Signed)
Better Continue with current prescription therapy as reflected on the Med list.  

## 2011-11-10 NOTE — Progress Notes (Signed)
  Subjective:    Patient ID: Bridget Oliver, female    DOB: 1940-11-27, 70 y.o.   MRN: [id number]  HPI The patient presents for a follow-up of  chronic hypertension, CRF, chronic dyslipidemia, type 2, gout diabetes controlled with medicines     Review of Systems  Constitutional: Negative for chills, activity change, appetite change, fatigue and unexpected weight change.  HENT: Negative for congestion, mouth sores and sinus pressure.   Eyes: Negative for visual disturbance.  Respiratory: Negative for cough and chest tightness.   Gastrointestinal: Negative for nausea and abdominal pain.  Genitourinary: Negative for frequency, difficulty urinating and vaginal pain.  Musculoskeletal: Negative for back pain and gait problem.  Skin: Negative for pallor and rash.  Neurological: Negative for dizziness, tremors, weakness, numbness and headaches.  Psychiatric/Behavioral: Negative for confusion and sleep disturbance. The patient is not nervous/anxious.    Wt Readings from Last 3 Encounters:  11/10/11 229 lb (103.874 kg)  07/17/11 234 lb (106.142 kg)  05/07/11 235 lb (106.595 kg)       Objective:   Physical Exam  Constitutional: She appears well-developed. No distress.       obese  HENT:  Head: Normocephalic.  Right Ear: External ear normal.  Left Ear: External ear normal.  Nose: Nose normal.  Mouth/Throat: Oropharynx is clear and moist.  Eyes: Conjunctivae are normal. Pupils are equal, round, and reactive to light. Right eye exhibits no discharge. Left eye exhibits no discharge.  Neck: Normal range of motion. Neck supple. No JVD present. No tracheal deviation present. No thyromegaly present.  Cardiovascular: Normal rate, regular rhythm and normal heart sounds.   Pulmonary/Chest: No stridor. No respiratory distress. She has no wheezes.  Abdominal: Soft. Bowel sounds are normal. She exhibits no distension and no mass. There is no tenderness. There is no rebound and no guarding.    Musculoskeletal: She exhibits no edema and no tenderness.  Lymphadenopathy:    She has no cervical adenopathy.  Neurological: She displays normal reflexes. No cranial nerve deficit. She exhibits normal muscle tone. Coordination normal.  Skin: No rash noted. No erythema.  Psychiatric: She has a normal mood and affect. Her behavior is normal. Judgment and thought content normal.     Lab Results  Component Value Date   WBC 6.6 03/30/2011   HGB 13.9 03/30/2011   HCT 40.4 03/30/2011   PLT 224.0 03/30/2011   GLUCOSE 107* 11/05/2011   CHOL 148 11/05/2011   TRIG 170.0* 11/05/2011   HDL 31.20* 11/05/2011   LDLDIRECT 127.9 07/14/2011   LDLCALC 83 11/05/2011   ALT 19 11/05/2011   AST 21 11/05/2011   NA 141 11/05/2011   K 4.0 11/05/2011   CL 103 11/05/2011   CREATININE 1.9* 11/05/2011   BUN 41* 11/05/2011   CO2 30 11/05/2011   TSH 2.65 01/07/2011   INR 0.9 RATIO 04/12/2007   HGBA1C 6.7* 04/09/2011        Assessment & Plan:

## 2011-12-31 ENCOUNTER — Other Ambulatory Visit: Payer: Self-pay | Admitting: *Deleted

## 2011-12-31 MED ORDER — CARVEDILOL 25 MG PO TABS: 25.0000 mg | ORAL_TABLET | Freq: Two times a day (BID) | ORAL | Status: DC

## 2012-01-06 ENCOUNTER — Other Ambulatory Visit: Payer: Self-pay | Admitting: *Deleted

## 2012-01-06 MED ORDER — FUROSEMIDE 80 MG PO TABS: 80.0000 mg | ORAL_TABLET | Freq: Every day | ORAL | Status: DC

## 2012-01-06 MED ORDER — METFORMIN HCL 500 MG PO TABS: 500.0000 mg | ORAL_TABLET | Freq: Every day | ORAL | Status: DC

## 2012-01-12 ENCOUNTER — Other Ambulatory Visit: Payer: Self-pay | Admitting: *Deleted

## 2012-01-12 MED ORDER — POTASSIUM CHLORIDE CRYS ER 20 MEQ PO TBCR: 20.0000 meq | EXTENDED_RELEASE_TABLET | Freq: Two times a day (BID) | ORAL | Status: DC

## 2012-02-03 ENCOUNTER — Other Ambulatory Visit: Payer: Self-pay | Admitting: Internal Medicine

## 2012-02-12 ENCOUNTER — Encounter: Payer: Self-pay | Admitting: Internal Medicine

## 2012-02-18 ENCOUNTER — Other Ambulatory Visit (INDEPENDENT_AMBULATORY_CARE_PROVIDER_SITE_OTHER): Payer: 59

## 2012-02-18 ENCOUNTER — Ambulatory Visit: Payer: 59 | Admitting: Internal Medicine

## 2012-02-18 DIAGNOSIS — E538 Deficiency of other specified B group vitamins: Secondary | ICD-10-CM

## 2012-02-18 DIAGNOSIS — Z23 Encounter for immunization: Secondary | ICD-10-CM

## 2012-02-18 DIAGNOSIS — I10 Essential (primary) hypertension: Secondary | ICD-10-CM

## 2012-02-18 DIAGNOSIS — N289 Disorder of kidney and ureter, unspecified: Secondary | ICD-10-CM

## 2012-02-18 DIAGNOSIS — E119 Type 2 diabetes mellitus without complications: Secondary | ICD-10-CM

## 2012-02-18 DIAGNOSIS — M109 Gout, unspecified: Secondary | ICD-10-CM

## 2012-02-18 LAB — BASIC METABOLIC PANEL
BUN: 56 mg/dL — ABNORMAL HIGH (ref 6–23)
Chloride: 100 mEq/L (ref 96–112)
Creatinine, Ser: 2.3 mg/dL — ABNORMAL HIGH (ref 0.4–1.2)
Glucose, Bld: 108 mg/dL — ABNORMAL HIGH (ref 70–99)
Potassium: 4.1 mEq/L (ref 3.5–5.1)

## 2012-02-18 LAB — CBC WITH DIFFERENTIAL/PLATELET
Basophils Relative: 0.7 % (ref 0.0–3.0)
Eosinophils Absolute: 0.2 10*3/uL (ref 0.0–0.7)
Eosinophils Relative: 2.6 % (ref 0.0–5.0)
Hemoglobin: 12.1 g/dL (ref 12.0–15.0)
Lymphocytes Relative: 35.8 % (ref 12.0–46.0)
MCHC: 33.1 g/dL (ref 30.0–36.0)
MCV: 83.9 fl (ref 78.0–100.0)
Monocytes Absolute: 0.8 10*3/uL (ref 0.1–1.0)
Neutro Abs: 3.6 10*3/uL (ref 1.4–7.7)
Neutrophils Relative %: 50 % (ref 43.0–77.0)
RBC: 4.36 Mil/uL (ref 3.87–5.11)
WBC: 7.1 10*3/uL (ref 4.5–10.5)

## 2012-02-18 LAB — HEPATIC FUNCTION PANEL
ALT: 21 U/L (ref 0–35)
AST: 20 U/L (ref 0–37)
Albumin: 3.7 g/dL (ref 3.5–5.2)
Total Bilirubin: 0.3 mg/dL (ref 0.3–1.2)

## 2012-02-18 LAB — HEMOGLOBIN A1C: Hgb A1c MFr Bld: 6.7 % — ABNORMAL HIGH (ref 4.6–6.5)

## 2012-02-24 ENCOUNTER — Encounter: Payer: Self-pay | Admitting: Internal Medicine

## 2012-02-24 ENCOUNTER — Ambulatory Visit (INDEPENDENT_AMBULATORY_CARE_PROVIDER_SITE_OTHER): Payer: 59 | Admitting: Internal Medicine

## 2012-02-24 VITALS — BP 140/66 | HR 65 | Temp 97.0°F | Wt 231.5 lb

## 2012-02-24 DIAGNOSIS — E538 Deficiency of other specified B group vitamins: Secondary | ICD-10-CM

## 2012-02-24 DIAGNOSIS — N289 Disorder of kidney and ureter, unspecified: Secondary | ICD-10-CM

## 2012-02-24 DIAGNOSIS — I10 Essential (primary) hypertension: Secondary | ICD-10-CM

## 2012-02-24 DIAGNOSIS — M109 Gout, unspecified: Secondary | ICD-10-CM

## 2012-02-24 DIAGNOSIS — E1129 Type 2 diabetes mellitus with other diabetic kidney complication: Secondary | ICD-10-CM

## 2012-02-24 DIAGNOSIS — N058 Unspecified nephritic syndrome with other morphologic changes: Secondary | ICD-10-CM

## 2012-02-24 MED ORDER — POTASSIUM CHLORIDE CRYS ER 20 MEQ PO TBCR: 20.0000 meq | EXTENDED_RELEASE_TABLET | Freq: Every day | ORAL | Status: DC

## 2012-02-24 NOTE — Assessment & Plan Note (Signed)
Discussed diet  

## 2012-02-24 NOTE — Assessment & Plan Note (Addendum)
F/u w/Dr Justin Mend Decrease KCl

## 2012-02-24 NOTE — Progress Notes (Signed)
Patient ID: Bridget Oliver, female   DOB: 05/06/1941, 71 y.o.   MRN: [id number]  Subjective:    Patient ID: Bridget Oliver, female    DOB: 1941-03-20, 71 y.o.   MRN: [id number]  HPI The patient presents for a follow-up of  chronic hypertension, CRF, chronic dyslipidemia, type 2, gout diabetes controlled with medicines  BP Readings from Last 3 Encounters:  02/24/12 140/66  11/10/11 118/70  07/17/11 120/70   Past Medical History  Diagnosis Date  . Hypertension   . Hyperlipidemia   . Diabetes mellitus 2011    Type II  . Hyperglycemia   . Swelling of lower limb   . Obesity   . CRI (chronic renal insufficiency)     Stage 3  . Lactose intolerance   . Diarrhea   . Gout   . Macular degeneration    Past Surgical History  Procedure Date  . Cesarean section   . Abdominal hysterectomy   . Lumbar laminectomy   . Cataract extraction   . Pars plana vitrectomy w/ repair of macular hole     Bilateral    reports that she has never smoked. She does not have any smokeless tobacco history on file. She reports that she does not drink alcohol or use illicit drugs. family history includes Cancer in her other and Hypertension in her father and mother. No Known Allergies  Current Outpatient Prescriptions on File Prior to Visit  Medication Sig Dispense Refill  . Acetaminophen-Codeine 300-30 MG per tablet Take 1 tablet by mouth every 6 (six) hours as needed for pain.  40 tablet  1  . atorvastatin (LIPITOR) 40 MG tablet Take 1 tablet (40 mg total) by mouth daily.  90 tablet  3  . carvedilol (COREG) 25 MG tablet Take 1 tablet (25 mg total) by mouth 2 (two) times daily with a meal.  60 tablet  5  . Cholecalciferol (VITAMIN D3) 1000 UNITS CAPS Take 1 capsule by mouth daily.        . cyclobenzaprine (FLEXERIL) 5 MG tablet Take 1 tablet (5 mg total) by mouth 3 (three) times daily as needed for muscle spasms.  60 tablet  1  . Febuxostat (ULORIC) 80 MG TABS Take 1 tablet (80 mg total) by mouth daily.  30 tablet   11  . furosemide (LASIX) 80 MG tablet Take 1 tablet (80 mg total) by mouth daily.  30 tablet  5  . losartan (COZAAR) 100 MG tablet Take 1 tablet (100 mg total) by mouth daily.  30 tablet  11  . losartan-hydrochlorothiazide (HYZAAR) 100-25 MG per tablet TAKE 1 TAB BY MOUTH DAILY  90 tablet  1  . metFORMIN (GLUCOPHAGE) 500 MG tablet Take 1 tablet (500 mg total) by mouth daily with breakfast.  30 tablet  5  . spironolactone (ALDACTONE) 50 MG tablet Take 50 mg by mouth every morning.        . vitamin B-12 (CYANOCOBALAMIN) 1000 MCG tablet Take 500 mcg by mouth daily.        Marland Kitchen DISCONTD: potassium chloride SA (KLOR-CON M20) 20 MEQ tablet Take 1 tablet (20 mEq total) by mouth 2 (two) times daily.  180 tablet  1   Current Facility-Administered Medications on File Prior to Visit  Medication Dose Route Frequency Provider Last Rate Last Dose  . methylPREDNISolone acetate (DEPO-MEDROL) 20 MG/ML injection 20 mg  20 mg Intra-articular Once Cassandria Anger, MD          Review of Systems  Constitutional: Negative for chills, activity change, appetite change, fatigue and unexpected weight change.  HENT: Negative for congestion, mouth sores and sinus pressure.   Eyes: Negative for visual disturbance.  Respiratory: Negative for cough and chest tightness.   Gastrointestinal: Negative for nausea and abdominal pain.  Genitourinary: Negative for frequency, difficulty urinating and vaginal pain.  Musculoskeletal: Negative for back pain and gait problem.  Skin: Negative for pallor and rash.  Neurological: Negative for dizziness, tremors, weakness, numbness and headaches.  Psychiatric/Behavioral: Negative for confusion and sleep disturbance. The patient is not nervous/anxious.    Wt Readings from Last 3 Encounters:  02/24/12 231 lb 8 oz (105.008 kg)  11/10/11 229 lb (103.874 kg)  07/17/11 234 lb (106.142 kg)   BP 140/66  Pulse 65  Temp(Src) 97 F (36.1 C) (Oral)  Wt 231 lb 8 oz (105.008 kg)  SpO2  97%      Objective:   Physical Exam  Constitutional: She appears well-developed. No distress.       obese  HENT:  Head: Normocephalic.  Right Ear: External ear normal.  Left Ear: External ear normal.  Nose: Nose normal.  Mouth/Throat: Oropharynx is clear and moist.  Eyes: Conjunctivae are normal. Pupils are equal, round, and reactive to light. Right eye exhibits no discharge. Left eye exhibits no discharge.  Neck: Normal range of motion. Neck supple. No JVD present. No tracheal deviation present. No thyromegaly present.  Cardiovascular: Normal rate, regular rhythm and normal heart sounds.   Pulmonary/Chest: No stridor. No respiratory distress. She has no wheezes.  Abdominal: Soft. Bowel sounds are normal. She exhibits no distension and no mass. There is no tenderness. There is no rebound and no guarding.  Musculoskeletal: She exhibits no edema and no tenderness.  Lymphadenopathy:    She has no cervical adenopathy.  Neurological: She displays normal reflexes. No cranial nerve deficit. She exhibits normal muscle tone. Coordination normal.  Skin: No rash noted. No erythema.  Psychiatric: She has a normal mood and affect. Her behavior is normal. Judgment and thought content normal.     Lab Results  Component Value Date   WBC 7.1 02/18/2012   HGB 12.1 02/18/2012   HCT 36.6 02/18/2012   PLT 247.0 02/18/2012   GLUCOSE 108* 02/18/2012   CHOL 148 11/05/2011   TRIG 170.0* 11/05/2011   HDL 31.20* 11/05/2011   LDLDIRECT 127.9 07/14/2011   LDLCALC 83 11/05/2011   ALT 21 02/18/2012   AST 20 02/18/2012   NA 139 02/18/2012   K 4.1 02/18/2012   CL 100 02/18/2012   CREATININE 2.3* 02/18/2012   BUN 56* 02/18/2012   CO2 29 02/18/2012   TSH 2.65 01/07/2011   INR 0.9 RATIO 04/12/2007   HGBA1C 6.7* 02/18/2012        Assessment & Plan:

## 2012-02-24 NOTE — Assessment & Plan Note (Signed)
discussed diet/wt loss

## 2012-02-24 NOTE — Assessment & Plan Note (Signed)
Continue with current prescription therapy as reflected on the Med list.  

## 2012-02-24 NOTE — Assessment & Plan Note (Signed)
Cont Metformin w/caution

## 2012-04-30 ENCOUNTER — Other Ambulatory Visit: Payer: Self-pay | Admitting: Internal Medicine

## 2012-05-02 ENCOUNTER — Other Ambulatory Visit: Payer: Self-pay | Admitting: Internal Medicine

## 2012-05-20 ENCOUNTER — Other Ambulatory Visit (INDEPENDENT_AMBULATORY_CARE_PROVIDER_SITE_OTHER): Payer: 59

## 2012-05-20 DIAGNOSIS — N058 Unspecified nephritic syndrome with other morphologic changes: Secondary | ICD-10-CM

## 2012-05-20 DIAGNOSIS — I10 Essential (primary) hypertension: Secondary | ICD-10-CM

## 2012-05-20 DIAGNOSIS — M109 Gout, unspecified: Secondary | ICD-10-CM

## 2012-05-20 DIAGNOSIS — E1129 Type 2 diabetes mellitus with other diabetic kidney complication: Secondary | ICD-10-CM

## 2012-05-20 DIAGNOSIS — N289 Disorder of kidney and ureter, unspecified: Secondary | ICD-10-CM

## 2012-05-20 DIAGNOSIS — E538 Deficiency of other specified B group vitamins: Secondary | ICD-10-CM

## 2012-05-20 LAB — BASIC METABOLIC PANEL
BUN: 26 mg/dL — ABNORMAL HIGH (ref 6–23)
CO2: 29 mEq/L (ref 19–32)
Chloride: 105 mEq/L (ref 96–112)
GFR: 43.15 mL/min — ABNORMAL LOW (ref >60.00)
Glucose, Bld: 98 mg/dL (ref 70–99)
Potassium: 4.2 mEq/L (ref 3.5–5.1)
Sodium: 141 mEq/L (ref 135–145)

## 2012-05-25 ENCOUNTER — Encounter: Payer: Self-pay | Admitting: Internal Medicine

## 2012-05-25 ENCOUNTER — Ambulatory Visit (INDEPENDENT_AMBULATORY_CARE_PROVIDER_SITE_OTHER): Payer: 59 | Admitting: Internal Medicine

## 2012-05-25 VITALS — BP 120/60 | HR 76 | Temp 98.2°F | Resp 16 | Wt 227.0 lb

## 2012-05-25 DIAGNOSIS — N058 Unspecified nephritic syndrome with other morphologic changes: Secondary | ICD-10-CM

## 2012-05-25 DIAGNOSIS — I10 Essential (primary) hypertension: Secondary | ICD-10-CM

## 2012-05-25 DIAGNOSIS — E538 Deficiency of other specified B group vitamins: Secondary | ICD-10-CM

## 2012-05-25 DIAGNOSIS — N289 Disorder of kidney and ureter, unspecified: Secondary | ICD-10-CM

## 2012-05-25 DIAGNOSIS — Z1211 Encounter for screening for malignant neoplasm of colon: Secondary | ICD-10-CM

## 2012-05-25 DIAGNOSIS — E1129 Type 2 diabetes mellitus with other diabetic kidney complication: Secondary | ICD-10-CM

## 2012-05-25 NOTE — Assessment & Plan Note (Signed)
Cont w/diet 

## 2012-05-25 NOTE — Assessment & Plan Note (Signed)
Continue with current prescription therapy as reflected on the Med list.  

## 2012-05-25 NOTE — Progress Notes (Signed)
Patient ID: Bridget Oliver, female   DOB: 08-09-1941, 71 y.o.   MRN: [id number] Patient ID: Bridget Oliver, female   DOB: 07-03-1941, 71 y.o.   MRN: [id number]  Subjective:    Patient ID: Bridget Oliver, female    DOB: 1941/11/18, 71 y.o.   MRN: [id number]  HPI The patient presents for a follow-up of  chronic hypertension, CRF, chronic dyslipidemia, type 2, gout diabetes controlled with medicines  BP Readings from Last 3 Encounters:  05/25/12 120/60  02/24/12 140/66  11/10/11 118/70   Past Medical History  Diagnosis Date  . Hypertension   . Hyperlipidemia   . Diabetes mellitus 2011    Type II  . Hyperglycemia   . Swelling of lower limb   . Obesity   . CRI (chronic renal insufficiency)     Stage 3  . Lactose intolerance   . Diarrhea   . Gout   . Macular degeneration    Past Surgical History  Procedure Date  . Cesarean section   . Abdominal hysterectomy   . Lumbar laminectomy   . Cataract extraction   . Pars plana vitrectomy w/ repair of macular hole     Bilateral    reports that she has never smoked. She does not have any smokeless tobacco history on file. She reports that she does not drink alcohol or use illicit drugs. family history includes Cancer in her other and Hypertension in her father and mother. No Known Allergies  Current Outpatient Prescriptions on File Prior to Visit  Medication Sig Dispense Refill  . atorvastatin (LIPITOR) 40 MG tablet Take 1 tablet (40 mg total) by mouth daily.  90 tablet  3  . carvedilol (COREG) 25 MG tablet Take 1 tablet (25 mg total) by mouth 2 (two) times daily with a meal.  60 tablet  5  . Cholecalciferol (VITAMIN D3) 1000 UNITS CAPS Take 1 capsule by mouth daily.        . furosemide (LASIX) 80 MG tablet Take 1 tablet (80 mg total) by mouth daily.  30 tablet  5  . metFORMIN (GLUCOPHAGE) 500 MG tablet Take 1 tablet (500 mg total) by mouth daily with breakfast.  30 tablet  5  . potassium chloride SA (KLOR-CON M20) 20 MEQ tablet Take 1 tablet (20  mEq total) by mouth daily.  90 tablet  3  . ULORIC 80 MG TABS TAKE 1 TABLET (80 MG TOTAL) BY MOUTH DAILY.  30 tablet  10  . vitamin B-12 (CYANOCOBALAMIN) 1000 MCG tablet Take 500 mcg by mouth daily.        Marland Kitchen losartan (COZAAR) 100 MG tablet Take 1 tablet (100 mg total) by mouth daily.  30 tablet  11  . losartan-hydrochlorothiazide (HYZAAR) 100-25 MG per tablet TAKE 1 TAB BY MOUTH DAILY  90 tablet  1  . meloxicam (MOBIC) 7.5 MG tablet TAKE 1 TABLET BY MOUTH EVERY DAY FOR 10 DAYS THEN AS NEEDED FOR PAIN  30 tablet  5  . spironolactone (ALDACTONE) 50 MG tablet Take 50 mg by mouth every morning.         Current Facility-Administered Medications on File Prior to Visit  Medication Dose Route Frequency Provider Last Rate Last Dose  . methylPREDNISolone acetate (DEPO-MEDROL) 20 MG/ML injection 20 mg  20 mg Intra-articular Once Cassandria Anger, MD          Review of Systems  Constitutional: Negative for chills, activity change, appetite change, fatigue and unexpected weight change.  HENT: Negative  for congestion, mouth sores and sinus pressure.   Eyes: Negative for visual disturbance.  Respiratory: Negative for cough and chest tightness.   Gastrointestinal: Negative for nausea and abdominal pain.  Genitourinary: Negative for frequency, difficulty urinating and vaginal pain.  Musculoskeletal: Negative for back pain and gait problem.  Skin: Negative for pallor and rash.  Neurological: Negative for dizziness, tremors, weakness, numbness and headaches.  Psychiatric/Behavioral: Negative for confusion and disturbed wake/sleep cycle. The patient is not nervous/anxious.    Wt Readings from Last 3 Encounters:  05/25/12 227 lb (102.967 kg)  02/24/12 231 lb 8 oz (105.008 kg)  11/10/11 229 lb (103.874 kg)   BP 120/60  Pulse 76  Temp 98.2 F (36.8 C) (Oral)  Resp 16  Wt 227 lb (102.967 kg)      Objective:   Physical Exam  Constitutional: She appears well-developed. No distress.       obese    HENT:  Head: Normocephalic.  Right Ear: External ear normal.  Left Ear: External ear normal.  Nose: Nose normal.  Mouth/Throat: Oropharynx is clear and moist.  Eyes: Conjunctivae are normal. Pupils are equal, round, and reactive to light. Right eye exhibits no discharge. Left eye exhibits no discharge.  Neck: Normal range of motion. Neck supple. No JVD present. No tracheal deviation present. No thyromegaly present.  Cardiovascular: Normal rate, regular rhythm and normal heart sounds.   Pulmonary/Chest: No stridor. No respiratory distress. She has no wheezes.  Abdominal: Soft. Bowel sounds are normal. She exhibits no distension and no mass. There is no tenderness. There is no rebound and no guarding.  Musculoskeletal: She exhibits no edema and no tenderness.  Lymphadenopathy:    She has no cervical adenopathy.  Neurological: She displays normal reflexes. No cranial nerve deficit. She exhibits normal muscle tone. Coordination normal.  Skin: No rash noted. No erythema.  Psychiatric: She has a normal mood and affect. Her behavior is normal. Judgment and thought content normal.     Lab Results  Component Value Date   WBC 7.1 02/18/2012   HGB 12.1 02/18/2012   HCT 36.6 02/18/2012   PLT 247.0 02/18/2012   GLUCOSE 98 05/20/2012   CHOL 148 11/05/2011   TRIG 170.0* 11/05/2011   HDL 31.20* 11/05/2011   LDLDIRECT 127.9 07/14/2011   LDLCALC 83 11/05/2011   ALT 21 02/18/2012   AST 20 02/18/2012   NA 141 05/20/2012   K 4.2 05/20/2012   CL 105 05/20/2012   CREATININE 1.5* 05/20/2012   BUN 26* 05/20/2012   CO2 29 05/20/2012   TSH 2.65 01/07/2011   INR 0.9 RATIO 04/12/2007   HGBA1C 6.6* 05/20/2012        Assessment & Plan:

## 2012-06-28 ENCOUNTER — Other Ambulatory Visit: Payer: Self-pay | Admitting: Internal Medicine

## 2012-07-13 ENCOUNTER — Encounter: Payer: Self-pay | Admitting: Gastroenterology

## 2012-07-27 ENCOUNTER — Encounter: Payer: Self-pay | Admitting: Gastroenterology

## 2012-08-08 ENCOUNTER — Other Ambulatory Visit: Payer: Self-pay | Admitting: Internal Medicine

## 2012-08-10 ENCOUNTER — Telehealth: Payer: Self-pay | Admitting: *Deleted

## 2012-08-10 NOTE — Telephone Encounter (Signed)
Rec fax from pharmacy req to change Uloric to allopurinol due to cost savings. Please advise.

## 2012-08-10 NOTE — Telephone Encounter (Signed)
Uloric is better for the kidneys. Pls ask the pt to call Dr Justin Mend and ask him if ok to switch Thx

## 2012-08-11 NOTE — Telephone Encounter (Signed)
Pt informed

## 2012-08-26 ENCOUNTER — Other Ambulatory Visit: Payer: Self-pay | Admitting: Internal Medicine

## 2012-09-02 ENCOUNTER — Telehealth: Payer: Self-pay | Admitting: *Deleted

## 2012-09-02 ENCOUNTER — Encounter: Payer: Self-pay | Admitting: *Deleted

## 2012-09-02 NOTE — Telephone Encounter (Signed)
No show for previsit.  Left message on home phone.  Sent no show letter

## 2012-09-07 ENCOUNTER — Encounter: Payer: Self-pay | Admitting: Gastroenterology

## 2012-09-08 ENCOUNTER — Encounter: Payer: 59 | Admitting: Gastroenterology

## 2012-09-16 ENCOUNTER — Encounter: Payer: 59 | Admitting: Gastroenterology

## 2012-09-22 ENCOUNTER — Other Ambulatory Visit (INDEPENDENT_AMBULATORY_CARE_PROVIDER_SITE_OTHER): Payer: 59

## 2012-09-22 DIAGNOSIS — Z1211 Encounter for screening for malignant neoplasm of colon: Secondary | ICD-10-CM

## 2012-09-22 DIAGNOSIS — N058 Unspecified nephritic syndrome with other morphologic changes: Secondary | ICD-10-CM

## 2012-09-22 DIAGNOSIS — I10 Essential (primary) hypertension: Secondary | ICD-10-CM

## 2012-09-22 DIAGNOSIS — N289 Disorder of kidney and ureter, unspecified: Secondary | ICD-10-CM

## 2012-09-22 DIAGNOSIS — E538 Deficiency of other specified B group vitamins: Secondary | ICD-10-CM

## 2012-09-22 DIAGNOSIS — E1129 Type 2 diabetes mellitus with other diabetic kidney complication: Secondary | ICD-10-CM

## 2012-09-22 LAB — BASIC METABOLIC PANEL
Calcium: 9.2 mg/dL (ref 8.4–10.5)
GFR: 42.15 mL/min — ABNORMAL LOW (ref >60.00)
Glucose, Bld: 101 mg/dL — ABNORMAL HIGH (ref 70–99)
Sodium: 142 mEq/L (ref 135–145)

## 2012-09-22 LAB — VITAMIN B12: Vitamin B-12: 1424 pg/mL — ABNORMAL HIGH (ref 211–911)

## 2012-09-26 ENCOUNTER — Ambulatory Visit (INDEPENDENT_AMBULATORY_CARE_PROVIDER_SITE_OTHER): Payer: 59 | Admitting: Internal Medicine

## 2012-09-26 ENCOUNTER — Encounter: Payer: Self-pay | Admitting: Internal Medicine

## 2012-09-26 VITALS — BP 138/78 | HR 76 | Temp 97.6°F | Resp 16 | Wt 233.0 lb

## 2012-09-26 DIAGNOSIS — N058 Unspecified nephritic syndrome with other morphologic changes: Secondary | ICD-10-CM

## 2012-09-26 DIAGNOSIS — I10 Essential (primary) hypertension: Secondary | ICD-10-CM

## 2012-09-26 DIAGNOSIS — E538 Deficiency of other specified B group vitamins: Secondary | ICD-10-CM

## 2012-09-26 DIAGNOSIS — N289 Disorder of kidney and ureter, unspecified: Secondary | ICD-10-CM

## 2012-09-26 DIAGNOSIS — E1129 Type 2 diabetes mellitus with other diabetic kidney complication: Secondary | ICD-10-CM

## 2012-09-26 NOTE — Assessment & Plan Note (Signed)
Continue with current prescription therapy as reflected on the Med list.  

## 2012-09-26 NOTE — Assessment & Plan Note (Signed)
Off metformin 11/13

## 2012-09-26 NOTE — Assessment & Plan Note (Signed)
Reduce vit B12 -- 1/2 tab qod

## 2012-09-26 NOTE — Progress Notes (Signed)
Subjective:    Patient ID: Bridget Oliver, female    DOB: Oct 20, 1941, 71 y.o.   MRN: [id number]  HPI The patient presents for a follow-up of  chronic hypertension, CRF, chronic dyslipidemia, type 2, gout diabetes controlled with medicines. Off Metformin x 2 mo  BP Readings from Last 3 Encounters:  09/26/12 138/78  05/25/12 120/60  02/24/12 140/66   Wt Readings from Last 3 Encounters:  09/26/12 233 lb (105.688 kg)  05/25/12 227 lb (102.967 kg)  02/24/12 231 lb 8 oz (105.008 kg)    Past Medical History  Diagnosis Date  . Hypertension   . Hyperlipidemia   . Diabetes mellitus 2011    Type II  . Hyperglycemia   . Swelling of lower limb   . Obesity   . CRI (chronic renal insufficiency)     Stage 3  . Lactose intolerance   . Diarrhea   . Gout   . Macular degeneration    Past Surgical History  Procedure Date  . Cesarean section   . Abdominal hysterectomy   . Lumbar laminectomy   . Cataract extraction   . Pars plana vitrectomy w/ repair of macular hole     Bilateral    reports that she has never smoked. She does not have any smokeless tobacco history on file. She reports that she does not drink alcohol or use illicit drugs. family history includes Cancer in her other and Hypertension in her father and mother. No Known Allergies  Current Outpatient Prescriptions on File Prior to Visit  Medication Sig Dispense Refill  . atorvastatin (LIPITOR) 40 MG tablet TAKE 1 TABLET BY MOUTH EVERY DAY  90 tablet  3  . carvedilol (COREG) 25 MG tablet TAKE 1 TABLET (25 MG TOTAL) BY MOUTH 2 (TWO) TIMES DAILY WITH A MEAL.  60 tablet  5  . Cholecalciferol (VITAMIN D3) 1000 UNITS CAPS Take 1 capsule by mouth daily.        . furosemide (LASIX) 80 MG tablet TAKE 1 TABLET (80 MG TOTAL) BY MOUTH DAILY.  30 tablet  5  . potassium chloride SA (KLOR-CON M20) 20 MEQ tablet Take 1 tablet (20 mEq total) by mouth daily.  90 tablet  3  . ULORIC 80 MG TABS TAKE 1 TABLET (80 MG TOTAL) BY MOUTH DAILY.  30  tablet  10  . vitamin B-12 (CYANOCOBALAMIN) 1000 MCG tablet Take 500 mcg by mouth daily.        . metFORMIN (GLUCOPHAGE) 500 MG tablet TAKE 1 TABLET EVERY DAY WITH BREAKFAST  30 tablet  5   Current Facility-Administered Medications on File Prior to Visit  Medication Dose Route Frequency Provider Last Rate Last Dose  . methylPREDNISolone acetate (DEPO-MEDROL) 20 MG/ML injection 20 mg  20 mg Intra-articular Once Cassandria Anger, MD          Review of Systems  Constitutional: Negative for chills, activity change, appetite change, fatigue and unexpected weight change.  HENT: Negative for congestion, mouth sores and sinus pressure.   Eyes: Negative for visual disturbance.  Respiratory: Negative for cough and chest tightness.   Gastrointestinal: Negative for nausea and abdominal pain.  Genitourinary: Negative for frequency, difficulty urinating and vaginal pain.  Musculoskeletal: Negative for back pain and gait problem.  Skin: Negative for pallor and rash.  Neurological: Negative for dizziness, tremors, weakness, numbness and headaches.  Psychiatric/Behavioral: Negative for confusion and sleep disturbance. The patient is not nervous/anxious.     BP 138/78  Pulse 76  Temp  97.6 F (36.4 C) (Oral)  Resp 16  Wt 233 lb (105.688 kg)      Objective:   Physical Exam  Constitutional: She appears well-developed. No distress.       obese  HENT:  Head: Normocephalic.  Right Ear: External ear normal.  Left Ear: External ear normal.  Nose: Nose normal.  Mouth/Throat: Oropharynx is clear and moist.  Eyes: Conjunctivae normal are normal. Pupils are equal, round, and reactive to light. Right eye exhibits no discharge. Left eye exhibits no discharge.  Neck: Normal range of motion. Neck supple. No JVD present. No tracheal deviation present. No thyromegaly present.  Cardiovascular: Normal rate, regular rhythm and normal heart sounds.   Pulmonary/Chest: No stridor. No respiratory distress. She  has no wheezes.  Abdominal: Soft. Bowel sounds are normal. She exhibits no distension and no mass. There is no tenderness. There is no rebound and no guarding.  Musculoskeletal: She exhibits no edema and no tenderness.  Lymphadenopathy:    She has no cervical adenopathy.  Neurological: She displays normal reflexes. No cranial nerve deficit. She exhibits normal muscle tone. Coordination normal.  Skin: No rash noted. No erythema.  Psychiatric: She has a normal mood and affect. Her behavior is normal. Judgment and thought content normal.     Lab Results  Component Value Date   WBC 7.1 02/18/2012   HGB 12.1 02/18/2012   HCT 36.6 02/18/2012   PLT 247.0 02/18/2012   GLUCOSE 101* 09/22/2012   CHOL 148 11/05/2011   TRIG 170.0* 11/05/2011   HDL 31.20* 11/05/2011   LDLDIRECT 127.9 07/14/2011   LDLCALC 83 11/05/2011   ALT 21 02/18/2012   AST 20 02/18/2012   NA 142 09/22/2012   K 4.0 09/22/2012   CL 108 09/22/2012   CREATININE 1.6* 09/22/2012   BUN 23 09/22/2012   CO2 27 09/22/2012   TSH 2.65 01/07/2011   INR 0.9 RATIO 04/12/2007   HGBA1C 6.6* 09/22/2012        Assessment & Plan:

## 2012-10-13 ENCOUNTER — Ambulatory Visit (AMBULATORY_SURGERY_CENTER): Payer: 59 | Admitting: *Deleted

## 2012-10-13 ENCOUNTER — Encounter: Payer: Self-pay | Admitting: Gastroenterology

## 2012-10-13 VITALS — Ht 63.0 in | Wt 235.0 lb

## 2012-10-13 DIAGNOSIS — Z1211 Encounter for screening for malignant neoplasm of colon: Secondary | ICD-10-CM

## 2012-10-13 MED ORDER — MOVIPREP 100 G PO SOLR: ORAL | Status: DC

## 2012-11-02 ENCOUNTER — Ambulatory Visit (AMBULATORY_SURGERY_CENTER): Payer: 59 | Admitting: Gastroenterology

## 2012-11-02 ENCOUNTER — Encounter: Payer: Self-pay | Admitting: Gastroenterology

## 2012-11-02 VITALS — BP 169/71 | HR 67 | Temp 97.8°F | Resp 23 | Ht 63.0 in | Wt 235.0 lb

## 2012-11-02 DIAGNOSIS — Z1211 Encounter for screening for malignant neoplasm of colon: Secondary | ICD-10-CM

## 2012-11-02 DIAGNOSIS — D126 Benign neoplasm of colon, unspecified: Secondary | ICD-10-CM

## 2012-11-02 MED ORDER — SODIUM CHLORIDE 0.9 % IV SOLN: 500.0000 mL | INTRAVENOUS | Status: DC

## 2012-11-02 NOTE — Progress Notes (Signed)
Called to room to assist during endoscopic procedure.  Patient ID and intended procedure confirmed with present staff. Received instructions for my participation in the procedure from the performing physician.  

## 2012-11-02 NOTE — Patient Instructions (Addendum)

## 2012-11-02 NOTE — Progress Notes (Signed)
Patient did not experience any of the following events: a burn prior to discharge; a fall within the facility; wrong site/side/patient/procedure/implant event; or a hospital transfer or hospital admission upon discharge from the facility. (G8907) Patient did not have preoperative order for IV antibiotic SSI prophylaxis. (G8918)  

## 2012-11-02 NOTE — Op Note (Signed)
[facility]  [address] [place], [id number]   COLONOSCOPY PROCEDURE REPORT  PATIENT: Bridget Oliver, Bridget Oliver  MR#: [id number] BIRTHDATE: [birth year] , 50  yrs. old GENDER: Female ENDOSCOPIST: Ladene Artist, MD, [facility] PROCEDURE DATE:  11/02/2012 PROCEDURE:   Colonoscopy with biopsy ASA CLASS:   Class II INDICATIONS:average risk screening. MEDICATIONS: MAC sedation, administered by CRNA, propofol (Diprivan) 200mg  IV, and Robinul 0.2 mg IV DESCRIPTION OF PROCEDURE:   After the risks benefits and alternatives of the procedure were thoroughly explained, informed consent was obtained.  A digital rectal exam revealed no abnormalities of the rectum.   The LB [id number]  endoscope was introduced through the anus and advanced to the cecum, which was identified by both the appendix and ileocecal valve. No adverse events experienced.   The quality of the prep was good, using MoviPrep  The instrument was then slowly withdrawn as the colon was fully examined.  COLON FINDINGS: A sessile polyp measuring 4 mm in size was found at the ileocecal valve.  A polypectomy was performed with cold forceps.  The resection was complete and the polyp tissue was completely retrieved.   Moderate diverticulosis was noted in the sigmoid colon and descending colon.   The colon was otherwise normal.  There was no diverticulosis, inflammation, polyps or cancers unless previously stated.  Retroflexed views revealed no abnormalities. The time to cecum=1 minutes 40 seconds.  Withdrawal time=9 minutes 22 seconds.  The scope was withdrawn and the procedure completed. COMPLICATIONS: There were no complications.  ENDOSCOPIC IMPRESSION: 1.   Sessile polyp measuring 4 mm at the ileocecal valve; polypectomy performed with cold forceps 2.   Moderate diverticulosis was noted in the sigmoid colon and descending colon 3.   The colon was otherwise normal  RECOMMENDATIONS: 1.  Await pathology results 2.   Repeat colonoscopy in 5 years if polyp adenomatous; otherwise 10 years   eSigned:  Ladene Artist, MD, [facility] 11/02/2012 8:57 AM

## 2012-11-03 ENCOUNTER — Telehealth: Payer: Self-pay | Admitting: *Deleted

## 2012-11-03 NOTE — Telephone Encounter (Signed)
  Follow up Call-  Call back number 11/02/2012  Post procedure Call Back phone  # cell [phone] or [phone]  Permission to leave phone message Yes     Patient questions:  Do you have a fever, pain , or abdominal swelling? no Pain Score  0 *  Have you tolerated food without any problems? yes  Have you been able to return to your normal activities? yes  Do you have any questions about your discharge instructions: Diet   no Medications  no Follow up visit  no  Do you have questions or concerns about your Care? no  Actions: * If pain score is 4 or above: No action needed, pain <4.

## 2012-11-07 ENCOUNTER — Encounter: Payer: Self-pay | Admitting: Gastroenterology

## 2013-01-23 ENCOUNTER — Other Ambulatory Visit (INDEPENDENT_AMBULATORY_CARE_PROVIDER_SITE_OTHER): Payer: 59

## 2013-01-23 DIAGNOSIS — N058 Unspecified nephritic syndrome with other morphologic changes: Secondary | ICD-10-CM

## 2013-01-23 DIAGNOSIS — E1129 Type 2 diabetes mellitus with other diabetic kidney complication: Secondary | ICD-10-CM

## 2013-01-23 DIAGNOSIS — E538 Deficiency of other specified B group vitamins: Secondary | ICD-10-CM

## 2013-01-23 DIAGNOSIS — I10 Essential (primary) hypertension: Secondary | ICD-10-CM

## 2013-01-23 DIAGNOSIS — N289 Disorder of kidney and ureter, unspecified: Secondary | ICD-10-CM

## 2013-01-23 LAB — BASIC METABOLIC PANEL
BUN: 24 mg/dL — ABNORMAL HIGH (ref 6–23)
CO2: 25 mEq/L (ref 19–32)
Calcium: 9.4 mg/dL (ref 8.4–10.5)
Creatinine, Ser: 1.5 mg/dL — ABNORMAL HIGH (ref 0.4–1.2)
Glucose, Bld: 117 mg/dL — ABNORMAL HIGH (ref 70–99)

## 2013-01-25 ENCOUNTER — Encounter: Payer: Self-pay | Admitting: Internal Medicine

## 2013-01-25 ENCOUNTER — Ambulatory Visit (INDEPENDENT_AMBULATORY_CARE_PROVIDER_SITE_OTHER): Payer: 59 | Admitting: Internal Medicine

## 2013-01-25 VITALS — BP 158/70 | HR 72 | Temp 98.0°F | Resp 16 | Wt 237.0 lb

## 2013-01-25 DIAGNOSIS — E1129 Type 2 diabetes mellitus with other diabetic kidney complication: Secondary | ICD-10-CM

## 2013-01-25 DIAGNOSIS — N289 Disorder of kidney and ureter, unspecified: Secondary | ICD-10-CM

## 2013-01-25 DIAGNOSIS — N058 Unspecified nephritic syndrome with other morphologic changes: Secondary | ICD-10-CM

## 2013-01-25 DIAGNOSIS — E538 Deficiency of other specified B group vitamins: Secondary | ICD-10-CM

## 2013-01-25 DIAGNOSIS — M109 Gout, unspecified: Secondary | ICD-10-CM

## 2013-01-25 DIAGNOSIS — I10 Essential (primary) hypertension: Secondary | ICD-10-CM

## 2013-01-25 NOTE — Assessment & Plan Note (Signed)
Continue with current prescription therapy as reflected on the Med list.  

## 2013-01-25 NOTE — Assessment & Plan Note (Signed)
Watching  

## 2013-01-25 NOTE — Progress Notes (Signed)
Subjective:    HPI The patient presents for a follow-up of  chronic hypertension, CRF, chronic dyslipidemia, type 2, gout diabetes controlled with medicines. Off Metformin x 6 mo  BP Readings from Last 3 Encounters:  01/25/13 158/70  11/02/12 169/71  09/26/12 138/78   Wt Readings from Last 3 Encounters:  01/25/13 237 lb (107.502 kg)  11/02/12 235 lb (106.595 kg)  10/13/12 235 lb (106.595 kg)    Past Medical History  Diagnosis Date  . Hypertension   . Hyperlipidemia   . Hyperglycemia   . Swelling of lower limb   . Obesity   . CRI (chronic renal insufficiency)     Stage 3  . Lactose intolerance   . Diarrhea   . Gout   . Macular degeneration   . 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 Cesarean section  1969  . Abdominal hysterectomy  1985  . Lumbar laminectomy  2007  . Cataract extraction  2010 & 2011    bilateral  . Pars plana vitrectomy w/ repair of macular hole  2010 & 2011    Bilateral  . Appendectomy  1985    reports that she has never smoked. She has never used smokeless tobacco. She reports that she does not drink alcohol or use illicit drugs. family history includes Cancer in her other and Hypertension in her father and mother. No Known Allergies  Current Outpatient Prescriptions on File Prior to Visit  Medication Sig Dispense Refill  . atorvastatin (LIPITOR) 40 MG tablet TAKE 1 TABLET BY MOUTH EVERY DAY  90 tablet  3  . carvedilol (COREG) 25 MG tablet TAKE 1 TABLET (25 MG TOTAL) BY MOUTH 2 (TWO) TIMES DAILY WITH A MEAL.  60 tablet  5  . Fish Oil-Cholecalciferol (FISH OIL + D3) 1200-1000 MG-UNIT CAPS Take 1 tablet by mouth daily.      . furosemide (LASIX) 80 MG tablet TAKE 1 TABLET (80 MG TOTAL) BY MOUTH DAILY.  30 tablet  5  . Multiple Vitamins-Minerals (CENTRUM SILVER PO) Take by mouth.      . potassium chloride SA (KLOR-CON M20) 20 MEQ tablet Take 1 tablet (20 mEq total) by mouth daily.   90 tablet  3  . ULORIC 80 MG TABS TAKE 1 TABLET (80 MG TOTAL) BY MOUTH DAILY.  30 tablet  10  . vitamin B-12 (CYANOCOBALAMIN) 1000 MCG tablet Take 500 mcg by mouth every other day.       . vitamin C (ASCORBIC ACID) 500 MG tablet Take 500 mg by mouth daily.       No current facility-administered medications on file prior to visit.      Review of Systems  Constitutional: Negative for chills, activity change, appetite change, fatigue and unexpected weight change.  HENT: Negative for congestion, mouth sores and sinus pressure.   Eyes: Negative for visual disturbance.  Respiratory: Negative for cough and chest tightness.   Gastrointestinal: Negative for nausea and abdominal pain.  Genitourinary: Negative for frequency, difficulty urinating and vaginal pain.  Musculoskeletal: Negative for back pain and gait problem.  Skin: Negative for pallor and rash.  Neurological: Negative for dizziness, tremors, weakness, numbness and headaches.  Psychiatric/Behavioral: Negative for confusion and sleep disturbance. The patient is not nervous/anxious.     BP 158/70  Pulse 72  Temp(Src) 98 F (36.7 C) (Oral)  Resp 16  Wt 237 lb (107.502 kg)  BMI 41.99 kg/m2  Objective:   Physical Exam  Constitutional: She appears well-developed. No distress.  obese  HENT:  Head: Normocephalic.  Right Ear: External ear normal.  Left Ear: External ear normal.  Nose: Nose normal.  Mouth/Throat: Oropharynx is clear and moist.  Eyes: Conjunctivae are normal. Pupils are equal, round, and reactive to light. Right eye exhibits no discharge. Left eye exhibits no discharge.  Neck: Normal range of motion. Neck supple. No JVD present. No tracheal deviation present. No thyromegaly present.  Cardiovascular: Normal rate, regular rhythm and normal heart sounds.   Pulmonary/Chest: No stridor. No respiratory distress. She has no wheezes.  Abdominal: Soft. Bowel sounds are normal. She exhibits no distension and no mass.  There is no tenderness. There is no rebound and no guarding.  Musculoskeletal: She exhibits no edema and no tenderness.  Lymphadenopathy:    She has no cervical adenopathy.  Neurological: She displays normal reflexes. No cranial nerve deficit. She exhibits normal muscle tone. Coordination normal.  Skin: No rash noted. No erythema.  Psychiatric: She has a normal mood and affect. Her behavior is normal. Judgment and thought content normal.     Lab Results  Component Value Date   WBC 7.1 02/18/2012   HGB 12.1 02/18/2012   HCT 36.6 02/18/2012   PLT 247.0 02/18/2012   GLUCOSE 117* 01/23/2013   CHOL 148 11/05/2011   TRIG 170.0* 11/05/2011   HDL 31.20* 11/05/2011   LDLDIRECT 127.9 07/14/2011   LDLCALC 83 11/05/2011   ALT 21 02/18/2012   AST 20 02/18/2012   NA 140 01/23/2013   K 4.2 01/23/2013   CL 104 01/23/2013   CREATININE 1.5* 01/23/2013   BUN 24* 01/23/2013   CO2 25 01/23/2013   TSH 2.65 01/07/2011   INR 0.9 RATIO 04/12/2007   HGBA1C 6.6* 01/23/2013        Assessment & Plan:

## 2013-01-25 NOTE — Assessment & Plan Note (Signed)
Wt Readings from Last 3 Encounters:  01/25/13 237 lb (107.502 kg)  11/02/12 235 lb (106.595 kg)  10/13/12 235 lb (106.595 kg)

## 2013-01-26 ENCOUNTER — Encounter: Payer: Self-pay | Admitting: Internal Medicine

## 2013-02-03 ENCOUNTER — Other Ambulatory Visit: Payer: Self-pay | Admitting: Internal Medicine

## 2013-02-14 ENCOUNTER — Encounter: Payer: Self-pay | Admitting: Internal Medicine

## 2013-02-15 ENCOUNTER — Other Ambulatory Visit: Payer: Self-pay | Admitting: Internal Medicine

## 2013-03-07 ENCOUNTER — Encounter: Payer: Self-pay | Admitting: Internal Medicine

## 2013-03-08 ENCOUNTER — Encounter (INDEPENDENT_AMBULATORY_CARE_PROVIDER_SITE_OTHER): Payer: 59

## 2013-03-08 DIAGNOSIS — I6529 Occlusion and stenosis of unspecified carotid artery: Secondary | ICD-10-CM

## 2013-03-15 ENCOUNTER — Other Ambulatory Visit: Payer: Self-pay | Admitting: Internal Medicine

## 2013-03-22 ENCOUNTER — Other Ambulatory Visit: Payer: Self-pay | Admitting: Internal Medicine

## 2013-04-10 ENCOUNTER — Other Ambulatory Visit: Payer: Self-pay | Admitting: Internal Medicine

## 2013-04-21 ENCOUNTER — Other Ambulatory Visit: Payer: Self-pay | Admitting: Internal Medicine

## 2013-04-25 ENCOUNTER — Other Ambulatory Visit: Payer: Self-pay | Admitting: Internal Medicine

## 2013-05-30 ENCOUNTER — Ambulatory Visit: Payer: 59 | Admitting: Internal Medicine

## 2013-06-02 ENCOUNTER — Other Ambulatory Visit: Payer: Self-pay | Admitting: *Deleted

## 2013-06-02 ENCOUNTER — Other Ambulatory Visit (INDEPENDENT_AMBULATORY_CARE_PROVIDER_SITE_OTHER): Payer: 59

## 2013-06-02 DIAGNOSIS — N058 Unspecified nephritic syndrome with other morphologic changes: Secondary | ICD-10-CM

## 2013-06-02 DIAGNOSIS — I10 Essential (primary) hypertension: Secondary | ICD-10-CM

## 2013-06-02 DIAGNOSIS — N289 Disorder of kidney and ureter, unspecified: Secondary | ICD-10-CM

## 2013-06-02 DIAGNOSIS — E538 Deficiency of other specified B group vitamins: Secondary | ICD-10-CM

## 2013-06-02 DIAGNOSIS — E785 Hyperlipidemia, unspecified: Secondary | ICD-10-CM

## 2013-06-02 DIAGNOSIS — E1129 Type 2 diabetes mellitus with other diabetic kidney complication: Secondary | ICD-10-CM

## 2013-06-02 LAB — LIPID PANEL
HDL: 28.7 mg/dL — ABNORMAL LOW (ref >39.00)
Triglycerides: 174 mg/dL — ABNORMAL HIGH (ref 0.0–149.0)

## 2013-06-02 LAB — CBC WITH DIFFERENTIAL/PLATELET
Basophils Absolute: 0.1 10*3/uL (ref 0.0–0.1)
Eosinophils Absolute: 0.2 10*3/uL (ref 0.0–0.7)
Lymphocytes Relative: 32.8 % (ref 12.0–46.0)
MCHC: 32.9 g/dL (ref 30.0–36.0)
Neutro Abs: 2.9 10*3/uL (ref 1.4–7.7)
Neutrophils Relative %: 51.2 % (ref 43.0–77.0)
Platelets: 239 10*3/uL (ref 150.0–400.0)
RDW: 14.5 % (ref 11.5–14.6)

## 2013-06-02 LAB — BASIC METABOLIC PANEL
CO2: 27 mEq/L (ref 19–32)
Calcium: 9.4 mg/dL (ref 8.4–10.5)
Glucose, Bld: 101 mg/dL — ABNORMAL HIGH (ref 70–99)
Sodium: 138 mEq/L (ref 135–145)

## 2013-06-06 ENCOUNTER — Ambulatory Visit (INDEPENDENT_AMBULATORY_CARE_PROVIDER_SITE_OTHER): Payer: 59 | Admitting: Internal Medicine

## 2013-06-06 ENCOUNTER — Encounter: Payer: Self-pay | Admitting: Internal Medicine

## 2013-06-06 VITALS — BP 160/80 | HR 76 | Temp 97.6°F | Resp 16 | Wt 236.0 lb

## 2013-06-06 DIAGNOSIS — E538 Deficiency of other specified B group vitamins: Secondary | ICD-10-CM

## 2013-06-06 DIAGNOSIS — N058 Unspecified nephritic syndrome with other morphologic changes: Secondary | ICD-10-CM

## 2013-06-06 DIAGNOSIS — E1129 Type 2 diabetes mellitus with other diabetic kidney complication: Secondary | ICD-10-CM

## 2013-06-06 DIAGNOSIS — M25532 Pain in left wrist: Secondary | ICD-10-CM

## 2013-06-06 DIAGNOSIS — N289 Disorder of kidney and ureter, unspecified: Secondary | ICD-10-CM

## 2013-06-06 DIAGNOSIS — M25539 Pain in unspecified wrist: Secondary | ICD-10-CM | POA: Insufficient documentation

## 2013-06-06 MED ORDER — DICLOFENAC SODIUM 2 % TD SOLN: 1.0000 | Freq: Two times a day (BID) | TRANSDERMAL | Status: DC

## 2013-06-06 NOTE — Assessment & Plan Note (Signed)
Continue with current prescription therapy as reflected on the Med list.  

## 2013-06-06 NOTE — Progress Notes (Signed)
Subjective:    HPI  C/o L wrist pain x 80mo The patient presents for a follow-up of  chronic hypertension, CRF, chronic dyslipidemia, type 2, gout diabetes controlled with medicines. Off Metformin x 6 mo  BP Readings from Last 3 Encounters:  06/06/13 182/72  01/25/13 158/70  11/02/12 169/71   Wt Readings from Last 3 Encounters:  06/06/13 236 lb (107.049 kg)  01/25/13 237 lb (107.502 kg)  11/02/12 235 lb (106.595 kg)    Past Medical History  Diagnosis Date  . Hypertension   . Hyperlipidemia   . Hyperglycemia   . Swelling of lower limb   . Obesity   . CRI (chronic renal insufficiency)     Stage 3  . Lactose intolerance   . Diarrhea   . Gout   . Macular degeneration   . 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 Cesarean section  1969  . Abdominal hysterectomy  1985  . Lumbar laminectomy  2007  . Cataract extraction  2010 & 2011    bilateral  . Pars plana vitrectomy w/ repair of macular hole  2010 & 2011    Bilateral  . Appendectomy  1985    reports that she has never smoked. She has never used smokeless tobacco. She reports that she does not drink alcohol or use illicit drugs. family history includes Cancer in her other and Hypertension in her father and mother. No Known Allergies  Current Outpatient Prescriptions on File Prior to Visit  Medication Sig Dispense Refill  . atorvastatin (LIPITOR) 40 MG tablet TAKE 1 TABLET BY MOUTH EVERY DAY  90 tablet  3  . carvedilol (COREG) 25 MG tablet TAKE 1 TABLET (25 MG TOTAL) BY MOUTH 2 (TWO) TIMES DAILY WITH A MEAL.  60 tablet  5  . Fish Oil-Cholecalciferol (FISH OIL + D3) 1200-1000 MG-UNIT CAPS Take 1 tablet by mouth daily.      . furosemide (LASIX) 80 MG tablet TAKE 1 TABLET (80 MG TOTAL) BY MOUTH DAILY.  30 tablet  5  . KLOR-CON M20 20 MEQ tablet TAKE 1 TABLET BY MOUTH EVERY DAY  90 tablet  2  . Multiple Vitamins-Minerals (CENTRUM SILVER PO) Take by mouth.       Marland Kitchen ULORIC 80 MG TABS TAKE 1 TABLET (80 MG TOTAL) BY MOUTH DAILY.  30 tablet  5  . vitamin B-12 (CYANOCOBALAMIN) 1000 MCG tablet Take 500 mcg by mouth every other day.       . vitamin C (ASCORBIC ACID) 500 MG tablet Take 500 mg by mouth daily.       No current facility-administered medications on file prior to visit.      Review of Systems  Constitutional: Negative for chills, activity change, appetite change, fatigue and unexpected weight change.  HENT: Negative for congestion, mouth sores and sinus pressure.   Eyes: Negative for visual disturbance.  Respiratory: Negative for cough and chest tightness.   Gastrointestinal: Negative for nausea and abdominal pain.  Genitourinary: Negative for frequency, difficulty urinating and vaginal pain.  Musculoskeletal: Negative for back pain and gait problem.  Skin: Negative for pallor and rash.  Neurological: Negative for dizziness, tremors, weakness, numbness and headaches.  Psychiatric/Behavioral: Negative for confusion and sleep disturbance. The patient is not nervous/anxious.     BP 182/72  Pulse 76  Temp(Src) 97.6 F (36.4 C) (Oral)  Resp 16  Wt 236 lb (107.049 kg)  BMI 41.82  kg/m2      Objective:   Physical Exam  Constitutional: She appears well-developed. No distress.  obese  HENT:  Head: Normocephalic.  Right Ear: External ear normal.  Left Ear: External ear normal.  Nose: Nose normal.  Mouth/Throat: Oropharynx is clear and moist.  Eyes: Conjunctivae are normal. Pupils are equal, round, and reactive to light. Right eye exhibits no discharge. Left eye exhibits no discharge.  Neck: Normal range of motion. Neck supple. No JVD present. No tracheal deviation present. No thyromegaly present.  Cardiovascular: Normal rate, regular rhythm and normal heart sounds.   Pulmonary/Chest: No stridor. No respiratory distress. She has no wheezes.  Abdominal: Soft. Bowel sounds are normal. She exhibits no distension and no mass. There is  no tenderness. There is no rebound and no guarding.  Musculoskeletal: She exhibits no edema and no tenderness.  Lymphadenopathy:    She has no cervical adenopathy.  Neurological: She displays normal reflexes. No cranial nerve deficit. She exhibits normal muscle tone. Coordination normal.  Skin: No rash noted. No erythema.  Psychiatric: She has a normal mood and affect. Her behavior is normal. Judgment and thought content normal.  L abd poll longus is tender   Lab Results  Component Value Date   WBC 5.7 06/02/2013   HGB 13.7 06/02/2013   HCT 41.8 06/02/2013   PLT 239.0 06/02/2013   GLUCOSE 101* 06/02/2013   CHOL 155 06/02/2013   TRIG 174.0* 06/02/2013   HDL 28.70* 06/02/2013   LDLDIRECT 127.9 07/14/2011   LDLCALC 92 06/02/2013   ALT 21 02/18/2012   AST 20 02/18/2012   NA 138 06/02/2013   K 4.6 06/02/2013   CL 105 06/02/2013   CREATININE 1.5* 06/02/2013   BUN 23 06/02/2013   CO2 27 06/02/2013   TSH 2.65 01/07/2011   INR 0.9 RATIO 04/12/2007   HGBA1C 6.7* 06/02/2013        Assessment & Plan:

## 2013-06-06 NOTE — Assessment & Plan Note (Signed)
Stable

## 2013-06-06 NOTE — Assessment & Plan Note (Addendum)
ACE Pennsaid bid wil inject if not better

## 2013-06-08 ENCOUNTER — Other Ambulatory Visit: Payer: Self-pay | Admitting: *Deleted

## 2013-06-08 NOTE — Telephone Encounter (Signed)
Ok. See Rx Thx 

## 2013-06-08 NOTE — Telephone Encounter (Signed)
Rec fax from pharmacy stating pt's Ins wont cover Pennsaid. However, they will cover Voltaren gel. Please advise.

## 2013-06-09 MED ORDER — DICLOFENAC SODIUM 1 % TD GEL: 2.0000 g | Freq: Four times a day (QID) | TRANSDERMAL | Status: DC

## 2013-06-09 NOTE — Telephone Encounter (Signed)
Sent to pharmacy....lmb

## 2013-07-12 ENCOUNTER — Ambulatory Visit (INDEPENDENT_AMBULATORY_CARE_PROVIDER_SITE_OTHER): Payer: Medicare Other | Admitting: Internal Medicine

## 2013-07-12 ENCOUNTER — Encounter: Payer: Self-pay | Admitting: Internal Medicine

## 2013-07-12 ENCOUNTER — Ambulatory Visit: Payer: 59 | Admitting: Internal Medicine

## 2013-07-12 VITALS — BP 130/72 | HR 72 | Temp 98.5°F | Resp 16 | Wt 236.0 lb

## 2013-07-12 DIAGNOSIS — M25539 Pain in unspecified wrist: Secondary | ICD-10-CM

## 2013-07-12 DIAGNOSIS — M25532 Pain in left wrist: Secondary | ICD-10-CM

## 2013-07-12 MED ORDER — METHYLPREDNISOLONE ACETATE 20 MG/ML IJ SUSP: 20.0000 mg | Freq: Once | INTRAMUSCULAR | Status: DC

## 2013-07-12 NOTE — Assessment & Plan Note (Addendum)
DeQuervain's (L) - refractory Options discussed. She asked me to inject: See procedure ACE wrap

## 2013-07-12 NOTE — Addendum Note (Signed)
Addended by: Cassandria Anger on: 07/12/2013 04:56 PM   Modules accepted: Level of Service

## 2013-07-12 NOTE — Patient Instructions (Addendum)
A Band-Aid should be left on for 12 hours. Injection therapy is not a cure itself. It is used in conjunction with other modalities. You can use Tylenol if needed. You need to report immediately  if fever, chills or any signs of infection develop. 

## 2013-07-12 NOTE — Progress Notes (Signed)
Patient ID: Bridget Oliver, female   DOB: 01/11/1941, 72 y.o.   MRN: [id number]  F/u on L abductor poll longus tendon is tender - not better. Voltaren gel was too expensive.  PE: NAD R wrist ol L wrist is tender over abd poll longus Skin clear A/o/c    Procedure Note :     Injection of L abductor pollucis longus tendon sheath   Indication : L abductor pollucis longus tendonitis   Risks including unsuccessful procedure , bleeding, infection, bruising, skin atrophy and others were explained to the patient in detail as well as the benefits. Informed consent was obtained and signed.   Tthe patient was placed in a comfortable position. L abductor pollucis longus tendon was marked and  the skin was prepped with Betadine and alcohol. 1/2 inch 27-gauge needle was used. The needle was advanced  into the skin down to tendon. It  was injected with 1 mL of 2% lidocaine and 20 mg of Depo-Medrol in a usual fashion.  Band-Aid was  Applied. ACE wrap.   Tolerated well. Complications: None. Good pain relief following the procedure.  A Band-Aid should be left on for 12 hours. Injection therapy is not a cure itself. It is used in conjunction with other modalities. You can use Tylenol if needed. You need to report immediately  if fever, chills or any signs of infection develop.

## 2013-08-16 ENCOUNTER — Other Ambulatory Visit: Payer: Self-pay | Admitting: Internal Medicine

## 2013-08-16 NOTE — Telephone Encounter (Signed)
Refill done.  

## 2013-09-12 ENCOUNTER — Ambulatory Visit: Payer: 59 | Admitting: Internal Medicine

## 2013-09-14 ENCOUNTER — Ambulatory Visit (INDEPENDENT_AMBULATORY_CARE_PROVIDER_SITE_OTHER): Payer: Medicare Other | Admitting: Internal Medicine

## 2013-09-14 ENCOUNTER — Other Ambulatory Visit (INDEPENDENT_AMBULATORY_CARE_PROVIDER_SITE_OTHER): Payer: Medicare Other

## 2013-09-14 ENCOUNTER — Encounter: Payer: Self-pay | Admitting: Internal Medicine

## 2013-09-14 VITALS — BP 140/80 | HR 72 | Temp 97.5°F | Resp 16 | Wt 235.0 lb

## 2013-09-14 DIAGNOSIS — E785 Hyperlipidemia, unspecified: Secondary | ICD-10-CM

## 2013-09-14 DIAGNOSIS — Z23 Encounter for immunization: Secondary | ICD-10-CM

## 2013-09-14 DIAGNOSIS — N289 Disorder of kidney and ureter, unspecified: Secondary | ICD-10-CM

## 2013-09-14 DIAGNOSIS — I10 Essential (primary) hypertension: Secondary | ICD-10-CM

## 2013-09-14 DIAGNOSIS — N058 Unspecified nephritic syndrome with other morphologic changes: Secondary | ICD-10-CM

## 2013-09-14 DIAGNOSIS — E538 Deficiency of other specified B group vitamins: Secondary | ICD-10-CM

## 2013-09-14 DIAGNOSIS — E1129 Type 2 diabetes mellitus with other diabetic kidney complication: Secondary | ICD-10-CM

## 2013-09-14 LAB — BASIC METABOLIC PANEL
CO2: 26 mEq/L (ref 19–32)
Chloride: 105 mEq/L (ref 96–112)
GFR: 43.64 mL/min — ABNORMAL LOW (ref >60.00)
Potassium: 4 mEq/L (ref 3.5–5.1)
Sodium: 140 mEq/L (ref 135–145)

## 2013-09-14 LAB — HEMOGLOBIN A1C: Hgb A1c MFr Bld: 6.6 % — ABNORMAL HIGH (ref 4.6–6.5)

## 2013-09-14 NOTE — Assessment & Plan Note (Signed)
Labs

## 2013-09-14 NOTE — Assessment & Plan Note (Signed)
Continue with current prescription therapy as reflected on the Med list.  

## 2013-09-14 NOTE — Progress Notes (Signed)
Subjective:    HPI  The patient presents for a follow-up of  chronic hypertension, CRF, chronic dyslipidemia, type 2, gout diabetes controlled with medicines.   Off Metformin x 6 mo  BP Readings from Last 3 Encounters:  09/14/13 140/80  07/12/13 130/72  06/06/13 160/80   Wt Readings from Last 3 Encounters:  09/14/13 235 lb (106.595 kg)  07/12/13 236 lb (107.049 kg)  06/06/13 236 lb (107.049 kg)    Past Medical History  Diagnosis Date  . Hypertension   . Hyperlipidemia   . Hyperglycemia   . Swelling of lower limb   . Obesity   . CRI (chronic renal insufficiency)     Stage 3  . Lactose intolerance   . Diarrhea   . Gout   . Macular degeneration   . 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 Cesarean section  1969  . Abdominal hysterectomy  1985  . Lumbar laminectomy  2007  . Cataract extraction  2010 & 2011    bilateral  . Pars plana vitrectomy w/ repair of macular hole  2010 & 2011    Bilateral  . Appendectomy  1985    reports that she has never smoked. She has never used smokeless tobacco. She reports that she does not drink alcohol or use illicit drugs. family history includes Cancer in her other; Hypertension in her father and mother. No Known Allergies  Current Outpatient Prescriptions on File Prior to Visit  Medication Sig Dispense Refill  . atorvastatin (LIPITOR) 40 MG tablet TAKE 1 TABLET BY MOUTH EVERY DAY  90 tablet  3  . carvedilol (COREG) 25 MG tablet TAKE 1 TABLET (25 MG TOTAL) BY MOUTH 2 (TWO) TIMES DAILY WITH A MEAL.  60 tablet  5  . Fish Oil-Cholecalciferol (FISH OIL + D3) 1200-1000 MG-UNIT CAPS Take 1 tablet by mouth daily.      . furosemide (LASIX) 80 MG tablet TAKE 1 TABLET (80 MG TOTAL) BY MOUTH DAILY.  30 tablet  5  . KLOR-CON M20 20 MEQ tablet TAKE 1 TABLET BY MOUTH EVERY DAY  90 tablet  2  . Multiple Vitamins-Minerals (CENTRUM SILVER PO) Take by mouth.      Marland Kitchen ULORIC 80 MG  TABS TAKE 1 TABLET (80 MG TOTAL) BY MOUTH DAILY.  30 tablet  5  . vitamin B-12 (CYANOCOBALAMIN) 1000 MCG tablet Take 500 mcg by mouth every other day.       . vitamin C (ASCORBIC ACID) 500 MG tablet Take 500 mg by mouth daily.      . diclofenac sodium (VOLTAREN) 1 % GEL Apply 2 g topically 4 (four) times daily.  100 g  3   Current Facility-Administered Medications on File Prior to Visit  Medication Dose Route Frequency Provider Last Rate Last Dose  . methylPREDNISolone acetate (DEPO-MEDROL) 20 MG/ML injection 20 mg  20 mg Intra-Lesional Once Cassandria Anger, MD          Review of Systems  Constitutional: Negative for chills, activity change, appetite change, fatigue and unexpected weight change.  HENT: Negative for congestion, mouth sores and sinus pressure.   Eyes: Negative for visual disturbance.  Respiratory: Negative for cough and chest tightness.   Gastrointestinal: Negative for nausea and abdominal pain.  Genitourinary: Negative for frequency, difficulty urinating and vaginal pain.  Musculoskeletal: Negative for back pain and gait problem.  Skin: Negative for pallor and rash.  Neurological: Negative  for dizziness, tremors, weakness, numbness and headaches.  Psychiatric/Behavioral: Negative for confusion and sleep disturbance. The patient is not nervous/anxious.     BP 140/80  Pulse 72  Temp(Src) 97.5 F (36.4 C) (Oral)  Resp 16  Wt 235 lb (106.595 kg)  BMI 41.64 kg/m2      Objective:   Physical Exam  Constitutional: She appears well-developed. No distress.  obese  HENT:  Head: Normocephalic.  Right Ear: External ear normal.  Left Ear: External ear normal.  Nose: Nose normal.  Mouth/Throat: Oropharynx is clear and moist.  Eyes: Conjunctivae are normal. Pupils are equal, round, and reactive to light. Right eye exhibits no discharge. Left eye exhibits no discharge.  Neck: Normal range of motion. Neck supple. No JVD present. No tracheal deviation present. No  thyromegaly present.  Cardiovascular: Normal rate, regular rhythm and normal heart sounds.   Pulmonary/Chest: No stridor. No respiratory distress. She has no wheezes.  Abdominal: Soft. Bowel sounds are normal. She exhibits no distension and no mass. There is no tenderness. There is no rebound and no guarding.  Musculoskeletal: She exhibits no edema and no tenderness.  Lymphadenopathy:    She has no cervical adenopathy.  Neurological: She displays normal reflexes. No cranial nerve deficit. She exhibits normal muscle tone. Coordination normal.  Skin: No rash noted. No erythema.  Psychiatric: She has a normal mood and affect. Her behavior is normal. Judgment and thought content normal.     Lab Results  Component Value Date   WBC 5.7 06/02/2013   HGB 13.7 06/02/2013   HCT 41.8 06/02/2013   PLT 239.0 06/02/2013   GLUCOSE 101* 06/02/2013   CHOL 155 06/02/2013   TRIG 174.0* 06/02/2013   HDL 28.70* 06/02/2013   LDLDIRECT 127.9 07/14/2011   LDLCALC 92 06/02/2013   ALT 21 02/18/2012   AST 20 02/18/2012   NA 138 06/02/2013   K 4.6 06/02/2013   CL 105 06/02/2013   CREATININE 1.5* 06/02/2013   BUN 23 06/02/2013   CO2 27 06/02/2013   TSH 2.65 01/07/2011   INR 0.9 RATIO 04/12/2007   HGBA1C 6.7* 06/02/2013        Assessment & Plan:

## 2013-10-17 ENCOUNTER — Other Ambulatory Visit: Payer: Self-pay | Admitting: Internal Medicine

## 2013-10-19 ENCOUNTER — Emergency Department (HOSPITAL_COMMUNITY): Payer: Medicare Other

## 2013-10-19 ENCOUNTER — Emergency Department (HOSPITAL_COMMUNITY)
Admission: EM | Admit: 2013-10-19 | Discharge: 2013-10-19 | Disposition: A | Discharge status: Home / Self Care | Payer: Medicare Other | Attending: Emergency Medicine | Admitting: Emergency Medicine

## 2013-10-19 ENCOUNTER — Encounter (HOSPITAL_COMMUNITY): Payer: Self-pay | Admitting: Emergency Medicine

## 2013-10-19 DIAGNOSIS — M25569 Pain in unspecified knee: Secondary | ICD-10-CM | POA: Insufficient documentation

## 2013-10-19 DIAGNOSIS — M79609 Pain in unspecified limb: Secondary | ICD-10-CM | POA: Insufficient documentation

## 2013-10-19 DIAGNOSIS — Z79899 Other long term (current) drug therapy: Secondary | ICD-10-CM | POA: Insufficient documentation

## 2013-10-19 DIAGNOSIS — I129 Hypertensive chronic kidney disease with stage 1 through stage 4 chronic kidney disease, or unspecified chronic kidney disease: Secondary | ICD-10-CM | POA: Insufficient documentation

## 2013-10-19 DIAGNOSIS — H3530 Unspecified macular degeneration: Secondary | ICD-10-CM | POA: Insufficient documentation

## 2013-10-19 DIAGNOSIS — M79601 Pain in right arm: Secondary | ICD-10-CM

## 2013-10-19 DIAGNOSIS — E669 Obesity, unspecified: Secondary | ICD-10-CM | POA: Insufficient documentation

## 2013-10-19 DIAGNOSIS — E739 Lactose intolerance, unspecified: Secondary | ICD-10-CM | POA: Insufficient documentation

## 2013-10-19 DIAGNOSIS — IMO0002 Reserved for concepts with insufficient information to code with codable children: Secondary | ICD-10-CM | POA: Insufficient documentation

## 2013-10-19 DIAGNOSIS — M25469 Effusion, unspecified knee: Secondary | ICD-10-CM | POA: Insufficient documentation

## 2013-10-19 DIAGNOSIS — Y9301 Activity, walking, marching and hiking: Secondary | ICD-10-CM | POA: Insufficient documentation

## 2013-10-19 DIAGNOSIS — Y929 Unspecified place or not applicable: Secondary | ICD-10-CM | POA: Insufficient documentation

## 2013-10-19 DIAGNOSIS — E785 Hyperlipidemia, unspecified: Secondary | ICD-10-CM | POA: Insufficient documentation

## 2013-10-19 DIAGNOSIS — W010XXA Fall on same level from slipping, tripping and stumbling without subsequent striking against object, initial encounter: Secondary | ICD-10-CM | POA: Insufficient documentation

## 2013-10-19 DIAGNOSIS — W19XXXA Unspecified fall, initial encounter: Secondary | ICD-10-CM

## 2013-10-19 DIAGNOSIS — R279 Unspecified lack of coordination: Secondary | ICD-10-CM | POA: Insufficient documentation

## 2013-10-19 DIAGNOSIS — N183 Chronic kidney disease, stage 3 unspecified: Secondary | ICD-10-CM | POA: Insufficient documentation

## 2013-10-19 DIAGNOSIS — M25562 Pain in left knee: Secondary | ICD-10-CM

## 2013-10-19 DIAGNOSIS — E119 Type 2 diabetes mellitus without complications: Secondary | ICD-10-CM | POA: Insufficient documentation

## 2013-10-19 DIAGNOSIS — M109 Gout, unspecified: Secondary | ICD-10-CM | POA: Insufficient documentation

## 2013-10-19 NOTE — ED Notes (Signed)
Pt st's she fell earlier tonight and now has pain in left knee.  Pt also c/o pain in right forearm up to shoulder

## 2013-10-19 NOTE — ED Provider Notes (Signed)
CSN: [id number]     Arrival date & time 10/19/13  2007 History  This chart was scribed for non-physician practitioner Noland Fordyce, working with Mercie Eon. Karle Starch, MD by Donato Schultz, ED Scribe. This patient was seen in room TR04C/TR04C and the patient's care was started at 9:49 PM.     Chief Complaint  Patient presents with  . Fall    Patient is a 72 y.o. female presenting with fall. The history is provided by the patient. No language interpreter was used.  Fall Pertinent negatives include no headaches.   HPI Comments: Bridget Oliver is a 72 y.o. female who presents to the Emergency Department complaining of constant left knee and right arm pain that started tonight after the patient fell after tripping and falling on cement.  The patient rates her pain as a 2/10.  The patient denies any LOC at the time of the incident.  The patient states that rotating her arm aggravates the pain.  She denies knowing how she landed on her right arm.  The patient denies any head, neck, or back pain, right wrist pain, and numbness and tingling in her right hand as associated symptoms.  She lists right shoulder pain as an associated symptom.  The patient states that he has a history of bursitis.  The patient is right hand dominant.    Past Medical History  Diagnosis Date  . Hypertension   . Hyperlipidemia   . Hyperglycemia   . Swelling of lower limb   . Obesity   . CRI (chronic renal insufficiency)     Stage 3  . Lactose intolerance   . Diarrhea   . Gout   . Macular degeneration   . 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 Cesarean section  1969  . Abdominal hysterectomy  1985  . Lumbar laminectomy  2007  . Cataract extraction  2010 & 2011    bilateral  . Pars plana vitrectomy w/ repair of macular hole  2010 & 2011    Bilateral  . Appendectomy  1985   Family History  Problem Relation Age of Onset  . Cancer Other     Lung  .  Hypertension Mother   . Hypertension Father    History  Substance Use Topics  . Smoking status: Never Smoker   . Smokeless tobacco: Never Used  . Alcohol Use: No   OB History   Grav Para Term Preterm Abortions TAB SAB Ect Mult Living                 Review of Systems  Musculoskeletal: Positive for arthralgias (right arm and left knee). Negative for back pain and neck pain.  Neurological: Negative for numbness and headaches.  All other systems reviewed and are negative.    Allergies  Review of patient's allergies indicates no known allergies.  Home Medications   Current Outpatient Rx  Name  Route  Sig  Dispense  Refill  . atorvastatin (LIPITOR) 40 MG tablet   Oral   Take 40 mg by mouth daily.         . carvedilol (COREG) 25 MG tablet   Oral   Take 25 mg by mouth 2 (two) times daily with a meal.         . Febuxostat (ULORIC) 80 MG TABS   Oral   Take 80 mg by mouth daily.         Marland Kitchen  Fish Oil-Cholecalciferol (FISH OIL + D3) 1200-1000 MG-UNIT CAPS   Oral   Take 1 tablet by mouth 2 (two) times daily.          . furosemide (LASIX) 80 MG tablet   Oral   Take 80 mg by mouth daily.         . Multiple Vitamins-Minerals (CENTRUM SILVER PO)   Oral   Take 1 tablet by mouth daily.          . potassium chloride SA (K-DUR,KLOR-CON) 20 MEQ tablet   Oral   Take 20 mEq by mouth daily.         . vitamin B-12 (CYANOCOBALAMIN) 1000 MCG tablet   Oral   Take 500 mcg by mouth every other day.          . vitamin C (ASCORBIC ACID) 500 MG tablet   Oral   Take 500 mg by mouth daily as needed (for cold).           Triage Vitals: BP 226/74  Pulse 89  Temp(Src) 98.5 F (36.9 C) (Oral)  Resp 18  Ht 5\' 6"  (1.676 m)  Wt 235 lb (106.595 kg)  BMI 37.95 kg/m2  SpO2 98%  Physical Exam  Nursing note and vitals reviewed. Constitutional: She is oriented to person, place, and time. She appears well-developed and well-nourished. No distress.  HENT:  Head:  Normocephalic.  Mouth/Throat: Uvula is midline, oropharynx is clear and moist and mucous membranes are normal.    1cm superficial laceration to lip. No dental pain or injury. No active bleeding.  Eyes: Conjunctivae and EOM are normal. No scleral icterus.  Neck: Normal range of motion. Neck supple.  No midline bone tenderness, no crepitus or step-offs.   Cardiovascular: Normal rate, regular rhythm and normal heart sounds.   Pulmonary/Chest: Effort normal and breath sounds normal. No respiratory distress. She has no wheezes. She has no rales. She exhibits no tenderness.  Abdominal: Soft. Bowel sounds are normal. She exhibits no distension and no mass. There is no tenderness. There is no rebound and no guarding.  Musculoskeletal: Normal range of motion. She exhibits edema ( mild edeam over left patella) and tenderness ( right forearm, left patella.).  FROM right shoulder elbow and wrist.  Pain in right forearm and elbow with supination and pronation of right arm.  No bony tenderness. 4/5 grip strength in right hand vs left.    Left knee: FROM, mild edeam, small abrasion over patella. Mild tenderness over patella.  Antalgic gait.  Neurological: She is alert and oriented to person, place, and time. No cranial nerve deficit or sensory deficit. Coordination normal. GCS eye subscore is 4. GCS verbal subscore is 5. GCS motor subscore is 6.  Skin: Skin is warm and dry. She is not diaphoretic.  Psychiatric: She has a normal mood and affect. Her behavior is normal.    ED Course  Procedures (including critical care time)  DIAGNOSTIC STUDIES: Oxygen Saturation is 98% on room air, normal by my interpretation.    COORDINATION OF CARE: 9:52 PM- Discussed obtaining an x-ray of the patient's left leg and right wrist.  The patient agreed to the treatment plan.    Labs Review Labs Reviewed - No data to display Imaging Review Dg Elbow Complete Right  10/19/2013   CLINICAL DATA:  Right arm and left  knee pain after a fall tonight.  EXAM: RIGHT ELBOW - COMPLETE 3+ VIEW  COMPARISON:  None.  FINDINGS: Spurring of the coronoid process consistent  with degenerative change. There is no evidence of fracture, dislocation, or joint effusion. There is no evidence of arthropathy or other focal bone abnormality. Soft tissues are unremarkable.  IMPRESSION: No acute bony abnormalities.   Electronically Signed   By: Lucienne Capers M.D.   On: 10/19/2013 22:29   Dg Wrist Complete Right  10/19/2013   CLINICAL DATA:  Right arm and left knee pain after fall tonight.  EXAM: RIGHT WRIST - COMPLETE 3+ VIEW  COMPARISON:  None.  FINDINGS: Probably degenerative cysts in the base of the 1st metacarpal bone and in the triquetrum. No evidence of acute fracture or subluxation. Soft tissues are unremarkable.  IMPRESSION: No acute bony abnormalities.   Electronically Signed   By: Lucienne Capers M.D.   On: 10/19/2013 22:30   Dg Knee Complete 4 Views Left  10/19/2013   CLINICAL DATA:  Left knee pain status post fall  EXAM: LEFT KNEE - COMPLETE 4+ VIEW  COMPARISON:  None.  FINDINGS: There is no evidence of fracture, dislocation, or joint effusion. There is no evidence of arthropathy or other focal bone abnormality. Mild degenerative spurring is present at the superior pole of the patella. Soft tissues are unremarkable.  IMPRESSION: No acute osseous abnormality about the knee.   Electronically Signed   By: Jeannine Boga M.D.   On: 10/19/2013 22:27    EKG Interpretation   None       MDM   1. Fall, initial encounter   2. Right arm pain   3. Left knee pain    Pt presenting after fall from tripping.  Reports hitting lip during fall but denies LOC. Denies head, neck or back pain.  C/o right arm pain and left knee pain.  Neuro exam: unremarkable. No focal deficit. No head or neck, or spinal tenderness. Pt is not on blood thinners. Do not believe CT head or neck is needed at this time. Plain films right elbow, wrist and  left knee: no acute bony injuries.  Will place in sling for comfort. Advised to f/u with PCP next week for further evaluation and treatment of pain. Return precautions provided. Pt and daughter verbalized understanding and agreement with tx plan.   I personally performed the services described in this documentation, which was scribed in my presence. The recorded information has been reviewed and is accurate.    Noland Fordyce, PA-C 10/19/13 2303

## 2013-10-19 NOTE — Discharge Instructions (Signed)
Acromioclavicular Injuries The acromioclavicular [facility]) joint is the joint in the shoulder. There are many bands of tissue (ligaments) that surround the [facility] bones and joints. These bands of tissue can tear, which can lead to sprains and separations. The bones of the [facility] joint can also break (fracture).  HOME CARE   Put ice on the injured area.  Put ice in a plastic bag.  Place a towel between your skin and the bag.  Leave the ice on for 15-20 minutes, 03-04 times a day.  Wear your sling as told by your doctor. Remove the sling before showering and bathing. Keep the shoulder in the same place as when the sling is on. Do not lift the arm.  Gently tighten your figure-eight splint (if applied) every day. Tighten it enough to keep the shoulders held back. There should be room to place your finger between your body and the strap. Loosen the splint right away if you lose feeling (numbness) or have tingling in your hands.  Only take medicine as told by your doctor.  Keep all follow-up visits with your doctor. GET HELP RIGHT AWAY IF:   Your medicine does not help your pain.  You have more puffiness (swelling) or your bruising gets worse rather than better.  You were unable to follow up as told by your doctor.  You have tingling or lose even more feeling in your arm, forearm, or hand.  Your arm is cold or pale.  You have more pain in the hand, forearm, or fingers. MAKE SURE YOU:   Understand these instructions.  Will watch your condition.  Will get help right away if you are not doing well or get worse. Document Released: 04/29/2010 Document Revised: 02/01/2012 Document Reviewed: 04/29/2010 [employer] [facility] Patient Information 2014 [place].

## 2013-10-19 NOTE — ED Notes (Signed)
PA Notified of Blood Pressure

## 2013-10-19 NOTE — ED Provider Notes (Signed)
Medical screening examination/treatment/procedure(s) were performed by non-physician practitioner and as supervising physician I was immediately available for consultation/collaboration.  EKG Interpretation   None         Mykal Batiz B. Karle Starch, MD 10/19/13 2318

## 2013-11-13 ENCOUNTER — Ambulatory Visit (INDEPENDENT_AMBULATORY_CARE_PROVIDER_SITE_OTHER): Payer: Medicare Other | Admitting: Internal Medicine

## 2013-11-13 ENCOUNTER — Ambulatory Visit (INDEPENDENT_AMBULATORY_CARE_PROVIDER_SITE_OTHER)
Admission: RE | Admit: 2013-11-13 | Discharge: 2013-11-13 | Disposition: A | Discharge status: Home / Self Care | Payer: Medicare Other | Source: Ambulatory Visit | Attending: Internal Medicine | Admitting: Internal Medicine

## 2013-11-13 ENCOUNTER — Encounter: Payer: Self-pay | Admitting: Internal Medicine

## 2013-11-13 VITALS — BP 150/90 | HR 80 | Temp 96.8°F | Resp 16 | Wt 236.0 lb

## 2013-11-13 DIAGNOSIS — M79609 Pain in unspecified limb: Secondary | ICD-10-CM

## 2013-11-13 DIAGNOSIS — N289 Disorder of kidney and ureter, unspecified: Secondary | ICD-10-CM

## 2013-11-13 DIAGNOSIS — M79631 Pain in right forearm: Secondary | ICD-10-CM

## 2013-11-13 DIAGNOSIS — I517 Cardiomegaly: Secondary | ICD-10-CM

## 2013-11-13 DIAGNOSIS — M109 Gout, unspecified: Secondary | ICD-10-CM

## 2013-11-13 DIAGNOSIS — E119 Type 2 diabetes mellitus without complications: Secondary | ICD-10-CM

## 2013-11-13 DIAGNOSIS — I10 Essential (primary) hypertension: Secondary | ICD-10-CM

## 2013-11-13 MED ORDER — TRAMADOL HCL 50 MG PO TABS: 50.0000 mg | ORAL_TABLET | Freq: Two times a day (BID) | ORAL | Status: DC | PRN

## 2013-11-13 NOTE — Progress Notes (Signed)
Subjective:    HPI  The patient presents for a follow-up of  chronic hypertension, CRF, chronic dyslipidemia, type 2, gout diabetes controlled with medicines.   Off Metformin   BP Readings from Last 3 Encounters:  11/13/13 150/90  10/19/13 213/69  09/14/13 140/80   Wt Readings from Last 3 Encounters:  11/13/13 236 lb (107.049 kg)  10/19/13 235 lb (106.595 kg)  09/14/13 235 lb (106.595 kg)    Past Medical History  Diagnosis Date  . Hypertension   . Hyperlipidemia   . Hyperglycemia   . Swelling of lower limb   . Obesity   . CRI (chronic renal insufficiency)     Stage 3  . Lactose intolerance   . Diarrhea   . Gout   . Macular degeneration   . 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 Cesarean section  1969  . Abdominal hysterectomy  1985  . Lumbar laminectomy  2007  . Cataract extraction  2010 & 2011    bilateral  . Pars plana vitrectomy w/ repair of macular hole  2010 & 2011    Bilateral  . Appendectomy  1985    reports that she has never smoked. She has never used smokeless tobacco. She reports that she does not drink alcohol or use illicit drugs. family history includes Cancer in her other; Hypertension in her father and mother. No Known Allergies  Current Outpatient Prescriptions on File Prior to Visit  Medication Sig Dispense Refill  . atorvastatin (LIPITOR) 40 MG tablet Take 40 mg by mouth daily.      . carvedilol (COREG) 25 MG tablet Take 25 mg by mouth 2 (two) times daily with a meal.      . Febuxostat (ULORIC) 80 MG TABS Take 80 mg by mouth daily.      . Fish Oil-Cholecalciferol (FISH OIL + D3) 1200-1000 MG-UNIT CAPS Take 1 tablet by mouth 2 (two) times daily.       . furosemide (LASIX) 80 MG tablet Take 80 mg by mouth daily.      . Multiple Vitamins-Minerals (CENTRUM SILVER PO) Take 1 tablet by mouth daily.       . potassium chloride SA (K-DUR,KLOR-CON) 20 MEQ tablet Take 20 mEq by  mouth daily.      . vitamin B-12 (CYANOCOBALAMIN) 1000 MCG tablet Take 500 mcg by mouth every other day.       . vitamin C (ASCORBIC ACID) 500 MG tablet Take 500 mg by mouth daily as needed (for cold).        Current Facility-Administered Medications on File Prior to Visit  Medication Dose Route Frequency Provider Last Rate Last Dose  . methylPREDNISolone acetate (DEPO-MEDROL) 20 MG/ML injection 20 mg  20 mg Intra-Lesional Once Cassandria Anger, MD          Review of Systems  Constitutional: Negative for chills, activity change, appetite change, fatigue and unexpected weight change.  HENT: Negative for congestion, mouth sores and sinus pressure.   Eyes: Negative for visual disturbance.  Respiratory: Negative for cough and chest tightness.   Gastrointestinal: Negative for nausea and abdominal pain.  Genitourinary: Negative for frequency, difficulty urinating and vaginal pain.  Musculoskeletal: Negative for back pain and gait problem.  Skin: Negative for pallor and rash.  Neurological: Negative for dizziness, tremors, weakness, numbness and headaches.  Psychiatric/Behavioral: Negative for confusion and sleep disturbance. The patient is not nervous/anxious.  BP 150/90  Pulse 80  Temp(Src) 96.8 F (36 C) (Oral)  Resp 16  Wt 236 lb (107.049 kg)      Objective:   Physical Exam  Constitutional: She appears well-developed. No distress.  obese  HENT:  Head: Normocephalic.  Right Ear: External ear normal.  Left Ear: External ear normal.  Nose: Nose normal.  Mouth/Throat: Oropharynx is clear and moist.  Eyes: Conjunctivae are normal. Pupils are equal, round, and reactive to light. Right eye exhibits no discharge. Left eye exhibits no discharge.  Neck: Normal range of motion. Neck supple. No JVD present. No tracheal deviation present. No thyromegaly present.  Cardiovascular: Normal rate, regular rhythm and normal heart sounds.   Pulmonary/Chest: No stridor. No respiratory  distress. She has no wheezes.  Abdominal: Soft. Bowel sounds are normal. She exhibits no distension and no mass. There is no tenderness. There is no rebound and no guarding.  Musculoskeletal: She exhibits no edema and no tenderness.  Lymphadenopathy:    She has no cervical adenopathy.  Neurological: She displays normal reflexes. No cranial nerve deficit. She exhibits normal muscle tone. Coordination normal.  Skin: No rash noted. No erythema.  Psychiatric: She has a normal mood and affect. Her behavior is normal. Judgment and thought content normal.     Lab Results  Component Value Date   WBC 5.7 06/02/2013   HGB 13.7 06/02/2013   HCT 41.8 06/02/2013   PLT 239.0 06/02/2013   GLUCOSE 105* 09/14/2013   CHOL 155 06/02/2013   TRIG 174.0* 06/02/2013   HDL 28.70* 06/02/2013   LDLDIRECT 127.9 07/14/2011   LDLCALC 92 06/02/2013   ALT 21 02/18/2012   AST 20 02/18/2012   NA 140 09/14/2013   K 4.0 09/14/2013   CL 105 09/14/2013   CREATININE 1.5* 09/14/2013   BUN 25* 09/14/2013   CO2 26 09/14/2013   TSH 2.65 01/07/2011   INR 0.9 RATIO 04/12/2007   HGBA1C 6.6* 09/14/2013        Assessment & Plan:

## 2013-11-13 NOTE — Patient Instructions (Signed)
   Milk free trial (no milk, ice cream, cheese and yogurt) for 4-6 weeks. OK to use almond, coconut, rice or soy milk. "Almond breeze" brand tastes good.  

## 2013-11-13 NOTE — Assessment & Plan Note (Signed)
Continue with current prescription therapy as reflected on the Med list.  

## 2013-11-13 NOTE — Addendum Note (Signed)
Addended by: Cassandria Anger on: 11/13/2013 11:41 PM   Modules accepted: Orders, Level of Service

## 2013-11-13 NOTE — Assessment & Plan Note (Addendum)
D/c Aleve R forearm x ray Sports Med ref

## 2013-11-13 NOTE — Assessment & Plan Note (Signed)
Wt Readings from Last 3 Encounters:  11/13/13 236 lb (107.049 kg)  10/19/13 235 lb (106.595 kg)  09/14/13 235 lb (106.595 kg)

## 2013-11-13 NOTE — Progress Notes (Signed)
Pre visit review using our clinic review tool, if applicable. No additional management support is needed unless otherwise documented below in the visit note. 

## 2013-11-21 ENCOUNTER — Ambulatory Visit (INDEPENDENT_AMBULATORY_CARE_PROVIDER_SITE_OTHER): Payer: Medicare Other | Admitting: Family Medicine

## 2013-11-21 ENCOUNTER — Encounter: Payer: Self-pay | Admitting: Family Medicine

## 2013-11-21 ENCOUNTER — Other Ambulatory Visit (INDEPENDENT_AMBULATORY_CARE_PROVIDER_SITE_OTHER): Payer: Medicare Other

## 2013-11-21 VITALS — BP 144/80 | HR 61 | Wt 239.0 lb

## 2013-11-21 DIAGNOSIS — M79609 Pain in unspecified limb: Secondary | ICD-10-CM

## 2013-11-21 DIAGNOSIS — M79601 Pain in right arm: Secondary | ICD-10-CM

## 2013-11-21 DIAGNOSIS — M7710 Lateral epicondylitis, unspecified elbow: Secondary | ICD-10-CM

## 2013-11-21 DIAGNOSIS — M7711 Lateral epicondylitis, right elbow: Secondary | ICD-10-CM | POA: Insufficient documentation

## 2013-11-21 MED ORDER — MELOXICAM 15 MG PO TABS: 15.0000 mg | ORAL_TABLET | Freq: Every day | ORAL | Status: DC

## 2013-11-21 NOTE — Progress Notes (Signed)
Pre-visit discussion using our clinic review tool. No additional management support is needed unless otherwise documented below in the visit note.  

## 2013-11-21 NOTE — Patient Instructions (Signed)
Very nice to meet you and Happy new year Wear brace day and night for next 2 weeks then only at night for 1 week Meloxicam daily for 10 days then as needed.  Ice 20 minutes 2 times a day on wrist and on elbow.  Exercises starting on this weekend and do daily.  Come back in 3 weeks.

## 2013-11-21 NOTE — Progress Notes (Signed)
  I'm seeing this patient by the request  of:  Walker Kehr, MD  CC: Right forearm pain  HPI: Patient is a pleasant 72 year old right-hand-dominant female coming in with right forearm pain. 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 Patient did have a fall back over the Thanksgiving weekend onto the right side elbow. Since that time she's continued to have this right forearm pain. Patient states it seems to hurt more when she extends her wrist. Has trouble when trying to push herself up secondary to the pain. Does not know if it originates more posterior the elbow or to the wrist. Denies any numbness or tingling but states that the pain is more of a dull throbbing sensation but does respond somewhat to anti-inflammatories. Patient has done heat which does seem to help. Patient states the pain no can wake her up at night and states that the pain can be as bad as 7/10 in severity.   Past medical, surgical, family and social history reviewed. Medications reviewed all in the electronic medical record.   Review of Systems: No headache, visual changes, nausea, vomiting, diarrhea, constipation, dizziness, abdominal pain, skin rash, fevers, chills, night sweats, weight loss, swollen lymph nodes, body aches, joint swelling, muscle aches, chest pain, shortness of breath, mood changes.   Objective:    Blood pressure 144/80, pulse 61, weight 239 lb (108.41 kg), SpO2 98.00%.   General: No apparent distress alert and oriented x3 mood and affect normal, dressed appropriately.  HEENT: Pupils equal, extraocular movements intact Respiratory: Patient's speak in full sentences and does not appear short of breath Cardiovascular: No lower extremity edema, non tender, no erythema Skin: Warm dry intact with no signs of infection or rash on extremities or on axial skeleton. Abdomen: Soft nontender Neuro: Cranial nerves II through XII are intact, neurovascularly intact in all  extremities with 2+ DTRs and 2+ pulses. Lymph: No lymphadenopathy of posterior or anterior cervical chain or axillae bilaterally.  Gait normal with good balance and coordination.  MSK: Non tender with full range of motion and good stability and symmetric strength and tone of shoulders,  wrist, hip, knee and ankles bilaterally.  Elbow: Right Unremarkable to inspection. Range of motion full pronation, supination, flexion, extension but is painful. Strength is full to all of the above directions but does have pain with extension Stable to varus, valgus stress. Negative moving valgus stress test. Tender to palpation over the lateral epicondylar region Ulnar nerve does not sublux. Negative cubital tunnel Tinel's.  Musculoskeletal ultrasound was performed and interpreted by Charlann Boxer D.O.   Elbow:  Lateral epicondyle and common extensor tendon origin visualized. Mild edema noted. No avulsion seen. Question will tear in the common extensor tendon noted. Radial head unremarkable and located in annular ligament Medial epicondyle and common flexor tendon origin visualized.  No edema, effusions, or avulsions seen. Ulnar nerve in cubital tunnel unremarkable. Olecranon and triceps insertion visualized and unremarkable without edema, effusion, or avulsion.  No signs olecranon bursitis. Power doppler signal normal.  IMPRESSION:  Lateral epicondylitis with possible tear of the common extensor tendon.   Impression and Recommendations:     This case required medical decision making of moderate complexity.

## 2013-11-21 NOTE — Assessment & Plan Note (Signed)
I think patient is having some right tennis elbow accident exacerbated from a fall. I do not see any bony abnormality is and did review patient's x-rays today. X-rays were normal in unremarkable. Patient was given a wrist brace to wear a regular basis. Home exercise program given to start and 96 hours. Meloxicam daily for 10 days then as needed and warned of potential side effects. Icing protocol Routine followup in 3 weeks. Continuing to have pain we may consider an injection.

## 2013-12-11 ENCOUNTER — Encounter: Payer: Self-pay | Admitting: Family Medicine

## 2013-12-11 ENCOUNTER — Ambulatory Visit (INDEPENDENT_AMBULATORY_CARE_PROVIDER_SITE_OTHER): Payer: Medicare HMO | Admitting: Family Medicine

## 2013-12-11 ENCOUNTER — Other Ambulatory Visit (INDEPENDENT_AMBULATORY_CARE_PROVIDER_SITE_OTHER): Payer: Medicare HMO

## 2013-12-11 VITALS — BP 144/76 | HR 76 | Temp 97.7°F | Resp 18 | Wt 235.1 lb

## 2013-12-11 DIAGNOSIS — M79609 Pain in unspecified limb: Secondary | ICD-10-CM

## 2013-12-11 DIAGNOSIS — M7711 Lateral epicondylitis, right elbow: Secondary | ICD-10-CM

## 2013-12-11 DIAGNOSIS — M7710 Lateral epicondylitis, unspecified elbow: Secondary | ICD-10-CM

## 2013-12-11 DIAGNOSIS — M79641 Pain in right hand: Secondary | ICD-10-CM

## 2013-12-11 NOTE — Patient Instructions (Signed)
Good to see you Do exercises 3 times a week.  Ice after activity Wear brace at night for another 2 weeks.  If not completely better come back in 4 weeks.

## 2013-12-11 NOTE — Assessment & Plan Note (Signed)
Patient is improving very well with conservative therapy. Encourage her to do the exercises regularly 3 times a week as well as discuss wearing the brace at night for another 2 weeks. Patient did can come back again in 4 weeks if not completely resolved.

## 2013-12-11 NOTE — Progress Notes (Signed)
  CC: Right forearm pain  HPI: Patient is a pleasant 73 year old right-hand-dominant female coming in with right forearm pain follow up. Patient states that she is approximately 90% better. Patient is doing the exercises approximately 2 times a week when she remembers, wears the brace most days a week at night. Patient has not been icing but still continues to improve. Patient is able to do all activities of daily living without any discomfort but is only having discomfort with some pain with increased pressure.  Past medical, surgical, family and social history reviewed. Medications reviewed all in the electronic medical record.   Review of Systems: No headache, visual changes, nausea, vomiting, diarrhea, constipation, dizziness, abdominal pain, skin rash, fevers, chills, night sweats, weight loss, swollen lymph nodes, body aches, joint swelling, muscle aches, chest pain, shortness of breath, mood changes.   Objective:    Blood pressure 144/76, pulse 76, temperature 97.7 F (36.5 C), temperature source Oral, resp. rate 18, weight 235 lb 1.3 oz (106.632 kg), SpO2 93.00%.   General: No apparent distress alert and oriented x3 mood and affect normal, dressed appropriately.  HEENT: Pupils equal, extraocular movements intact Respiratory: Patient's speak in full sentences and does not appear short of breath Cardiovascular: No lower extremity edema, non tender, no erythema Skin: Warm dry intact with no signs of infection or rash on extremities or on axial skeleton. Abdomen: Soft nontender Neuro: Cranial nerves II through XII are intact, neurovascularly intact in all extremities with 2+ DTRs and 2+ pulses. Lymph: No lymphadenopathy of posterior or anterior cervical chain or axillae bilaterally.  Gait normal with good balance and coordination.  MSK: Non tender with full range of motion and good stability and symmetric strength and tone of shoulders,  wrist, hip, knee and ankles bilaterally.  Elbow:  Right Unremarkable to inspection. Range of motion full pronation, supination, flexion, extension. Strength is full to all of the above directions but does have pain with extension but now only against resistance.  Stable to varus, valgus stress. Negative moving valgus stress test. Tender to palpation over the lateral epicondylar region Ulnar nerve does not sublux. Negative cubital tunnel Tinel's.  Musculoskeletal ultrasound was performed and interpreted by Charlann Boxer D.O.   Elbow:  Lateral epicondyle and common extensor tendon origin visualized. No  edema noted. No avulsion seen. Scar tissue present at insertion but good doppler flow. Radial head unremarkable and located in annular ligament Medial epicondyle and common flexor tendon origin visualized.  No edema, effusions, or avulsions seen. Ulnar nerve in cubital tunnel unremarkable. Olecranon and triceps insertion visualized and unremarkable without edema, effusion, or avulsion.  No signs olecranon bursitis. Power doppler signal normal.  IMPRESSION:  Lateral epicondylitis healing with scar tissue.    Impression and Recommendations:     This case required medical decision making of moderate complexity.

## 2013-12-11 NOTE — Progress Notes (Signed)
Pre-visit discussion using our clinic review tool. No additional management support is needed unless otherwise documented below in the visit note.  

## 2013-12-29 ENCOUNTER — Telehealth: Payer: Self-pay

## 2013-12-29 NOTE — Telephone Encounter (Signed)
The patient needs to switch medication.  She is hoping to discuss this with the [facility] as soon as possible   Callback - [phone]

## 2014-01-02 NOTE — Telephone Encounter (Signed)
She can't take Allopurinol due to renal disease Thx

## 2014-01-02 NOTE — Telephone Encounter (Signed)
Called pt- she states Uloric is not covered by insurance. They suggest changing it to allopurinol. Please advise.

## 2014-01-08 MED ORDER — FEBUXOSTAT 80 MG PO TABS: 80.0000 mg | ORAL_TABLET | Freq: Every day | ORAL | Status: DC

## 2014-01-08 NOTE — Addendum Note (Signed)
Addended by: Cresenciano Lick on: 01/08/2014 09:28 AM   Modules accepted: Orders

## 2014-01-08 NOTE — Telephone Encounter (Signed)
Will initiate PA for Uloric.

## 2014-01-08 NOTE — Telephone Encounter (Signed)
Uloric samples upfront for pt. Left detailed mess informing pt.

## 2014-01-12 ENCOUNTER — Telehealth: Payer: Self-pay | Admitting: *Deleted

## 2014-01-12 NOTE — Telephone Encounter (Signed)
Uloric 80 mg PA is approved until 01/13/2016. Pt informed

## 2014-01-12 NOTE — Telephone Encounter (Signed)
[phone]. I answered all questions for PA. Will receive fax with insurance decision.   Ref # [id number]

## 2014-01-16 ENCOUNTER — Ambulatory Visit: Payer: Medicare Other | Admitting: Internal Medicine

## 2014-01-16 DIAGNOSIS — Z0289 Encounter for other administrative examinations: Secondary | ICD-10-CM

## 2014-01-26 ENCOUNTER — Other Ambulatory Visit: Payer: Self-pay

## 2014-01-26 MED ORDER — FEBUXOSTAT 80 MG PO TABS
80.0000 mg | ORAL_TABLET | Freq: Every day | ORAL | Status: DC
Start: 2014-01-26 — End: 2014-06-07

## 2014-01-26 MED ORDER — POTASSIUM CHLORIDE CRYS ER 20 MEQ PO TBCR: 20.0000 meq | EXTENDED_RELEASE_TABLET | Freq: Every day | ORAL | Status: DC

## 2014-01-26 MED ORDER — CARVEDILOL 25 MG PO TABS: 25.0000 mg | ORAL_TABLET | Freq: Two times a day (BID) | ORAL | Status: DC

## 2014-01-26 MED ORDER — ATORVASTATIN CALCIUM 40 MG PO TABS: 40.0000 mg | ORAL_TABLET | Freq: Every day | ORAL | Status: DC

## 2014-01-26 MED ORDER — FUROSEMIDE 80 MG PO TABS: 80.0000 mg | ORAL_TABLET | Freq: Every day | ORAL | Status: DC

## 2014-03-01 ENCOUNTER — Other Ambulatory Visit: Payer: Self-pay

## 2014-03-12 ENCOUNTER — Ambulatory Visit (HOSPITAL_COMMUNITY): Payer: Medicare HMO | Attending: Cardiology | Admitting: Cardiology

## 2014-03-12 DIAGNOSIS — I6529 Occlusion and stenosis of unspecified carotid artery: Secondary | ICD-10-CM | POA: Insufficient documentation

## 2014-03-12 NOTE — Progress Notes (Signed)
Carotid duplex complete 

## 2014-03-16 ENCOUNTER — Encounter: Payer: Self-pay | Admitting: Internal Medicine

## 2014-04-11 ENCOUNTER — Ambulatory Visit (INDEPENDENT_AMBULATORY_CARE_PROVIDER_SITE_OTHER): Payer: Medicare PPO | Admitting: Podiatry

## 2014-04-11 ENCOUNTER — Encounter: Payer: Self-pay | Admitting: Podiatry

## 2014-04-11 VITALS — BP 163/83 | HR 68 | Resp 16

## 2014-04-11 DIAGNOSIS — L600 Ingrowing nail: Secondary | ICD-10-CM

## 2014-04-11 NOTE — Progress Notes (Signed)
   Subjective:    Patient ID: Bridget Oliver, female    DOB: Apr 12, 1941, 73 y.o.   MRN: [id number]  HPI  Pain/ingrown toe nail, left big toe. Red swollen  Review of Systems  All other systems reviewed and are negative.      Objective:   Physical Exam        Assessment & Plan:

## 2014-04-11 NOTE — Progress Notes (Signed)
Subjective:     Patient ID: Bridget Oliver, female   DOB: [birth year], 73 y.o.   MRN: [id number]  HPI patient presents with a incurvated nail bed left big toe that she can not cut out herself and it is becoming increasingly sore for her   Review of Systems     Objective:   Physical Exam Neurovascular status is found to be intact with no help changes noted and I noted on the left hallux medial border is incurvated and sore when pressed with mild distal redness    Assessment:     Ingrown toenail deformity left hallux medial border    Plan:     H&P reviewed and today I have recommended removal of the nail corner. Patient wants this procedure done and today I infiltrated 60 mg Xylocaine Marcaine mixture remove the medial corner exposed matrix and applied phenol 3 applications followed by alcohol lavaged and sterile dressing. Given instructions on soaks and reappoint

## 2014-04-11 NOTE — Patient Instructions (Addendum)

## 2014-04-24 ENCOUNTER — Other Ambulatory Visit (INDEPENDENT_AMBULATORY_CARE_PROVIDER_SITE_OTHER): Payer: Medicare HMO

## 2014-04-24 ENCOUNTER — Ambulatory Visit (INDEPENDENT_AMBULATORY_CARE_PROVIDER_SITE_OTHER): Payer: Medicare HMO | Admitting: Internal Medicine

## 2014-04-24 ENCOUNTER — Encounter: Payer: Self-pay | Admitting: Internal Medicine

## 2014-04-24 VITALS — BP 140/80 | HR 72 | Temp 97.6°F | Resp 16 | Wt 236.0 lb

## 2014-04-24 DIAGNOSIS — N289 Disorder of kidney and ureter, unspecified: Secondary | ICD-10-CM

## 2014-04-24 DIAGNOSIS — E538 Deficiency of other specified B group vitamins: Secondary | ICD-10-CM

## 2014-04-24 DIAGNOSIS — E785 Hyperlipidemia, unspecified: Secondary | ICD-10-CM

## 2014-04-24 DIAGNOSIS — I10 Essential (primary) hypertension: Secondary | ICD-10-CM

## 2014-04-24 DIAGNOSIS — E119 Type 2 diabetes mellitus without complications: Secondary | ICD-10-CM

## 2014-04-24 LAB — BASIC METABOLIC PANEL
BUN: 22 mg/dL (ref 6–23)
CHLORIDE: 101 meq/L (ref 96–112)
CO2: 26 mEq/L (ref 19–32)
Calcium: 9.7 mg/dL (ref 8.4–10.5)
Creatinine, Ser: 1.6 mg/dL — ABNORMAL HIGH (ref 0.4–1.2)
GFR: 40.75 mL/min — ABNORMAL LOW (ref >60.00)
Glucose, Bld: 102 mg/dL — ABNORMAL HIGH (ref 70–99)
Potassium: 4.2 mEq/L (ref 3.5–5.1)
Sodium: 135 mEq/L (ref 135–145)

## 2014-04-24 LAB — HEMOGLOBIN A1C: HEMOGLOBIN A1C: 6.5 % (ref 4.6–6.5)

## 2014-04-24 MED ORDER — HYDRALAZINE HCL 25 MG PO TABS: 25.0000 mg | ORAL_TABLET | Freq: Three times a day (TID) | ORAL | Status: DC

## 2014-04-24 NOTE — Progress Notes (Signed)
Pre visit review using our clinic review tool, if applicable. No additional management support is needed unless otherwise documented below in the visit note. 

## 2014-04-24 NOTE — Assessment & Plan Note (Signed)
watching 

## 2014-04-24 NOTE — Progress Notes (Signed)
Subjective:    HPI  C/o SBP 200-210 at times The patient presents for a follow-up of  chronic hypertension, CRF, chronic dyslipidemia, type 2, gout diabetes controlled with medicines.   Off Metformin   BP Readings from Last 3 Encounters:  04/24/14 140/80  04/11/14 163/83  12/11/13 144/76   Wt Readings from Last 3 Encounters:  04/24/14 236 lb (107.049 kg)  12/11/13 235 lb 1.3 oz (106.632 kg)  11/21/13 239 lb (108.41 kg)    Past Medical History  Diagnosis Date  . Hypertension   . Hyperlipidemia   . Hyperglycemia   . Swelling of lower limb   . Obesity   . CRI (chronic renal insufficiency)     Stage 3  . Lactose intolerance   . Diarrhea   . Gout   . Macular degeneration   . 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 Cesarean section  1969  . Abdominal hysterectomy  1985  . Lumbar laminectomy  2007  . Cataract extraction  2010 & 2011    bilateral  . Pars plana vitrectomy w/ repair of macular hole  2010 & 2011    Bilateral  . Appendectomy  1985    reports that she has never smoked. She has never used smokeless tobacco. She reports that she does not drink alcohol or use illicit drugs. family history includes Cancer in her other; Hypertension in her father and mother. No Known Allergies  Current Outpatient Prescriptions on File Prior to Visit  Medication Sig Dispense Refill  . atorvastatin (LIPITOR) 40 MG tablet Take 1 tablet (40 mg total) by mouth daily.  90 tablet  3  . carvedilol (COREG) 25 MG tablet Take 1 tablet (25 mg total) by mouth 2 (two) times daily with a meal.  180 tablet  3  . Febuxostat (ULORIC) 80 MG TABS Take 1 tablet (80 mg total) by mouth daily.  90 tablet  3  . Fish Oil-Cholecalciferol (FISH OIL + D3) 1200-1000 MG-UNIT CAPS Take 1 tablet by mouth 2 (two) times daily.       . furosemide (LASIX) 80 MG tablet Take 1 tablet (80 mg total) by mouth daily.  90 tablet  3  . meloxicam (MOBIC)  15 MG tablet Take 1 tablet (15 mg total) by mouth daily.  30 tablet  0  . Multiple Vitamins-Minerals (CENTRUM SILVER PO) Take 1 tablet by mouth daily.       . potassium chloride SA (K-DUR,KLOR-CON) 20 MEQ tablet Take 1 tablet (20 mEq total) by mouth daily.  90 tablet  3  . traMADol (ULTRAM) 50 MG tablet Take 1-2 tablets (50-100 mg total) by mouth 2 (two) times daily as needed.  60 tablet  1  . vitamin B-12 (CYANOCOBALAMIN) 1000 MCG tablet Take 500 mcg by mouth every other day.       . vitamin C (ASCORBIC ACID) 500 MG tablet Take 500 mg by mouth daily as needed (for cold).        Current Facility-Administered Medications on File Prior to Visit  Medication Dose Route Frequency Provider Last Rate Last Dose  . methylPREDNISolone acetate (DEPO-MEDROL) 20 MG/ML injection 20 mg  20 mg Intra-Lesional Once Mikah Poss V, MD          Review of Systems  Constitutional: Negative for chills, activity change, appetite change, fatigue and unexpected weight change.  HENT: Negative for congestion, mouth sores and sinus pressure.  Eyes: Negative for visual disturbance.  Respiratory: Negative for cough and chest tightness.   Gastrointestinal: Negative for nausea and abdominal pain.  Genitourinary: Negative for frequency, difficulty urinating and vaginal pain.  Musculoskeletal: Negative for back pain and gait problem.  Skin: Negative for pallor and rash.  Neurological: Negative for dizziness, tremors, weakness, numbness and headaches.  Psychiatric/Behavioral: Negative for confusion and sleep disturbance. The patient is not nervous/anxious.     BP 140/80  Pulse 72  Temp(Src) 97.6 F (36.4 C) (Oral)  Resp 16  Wt 236 lb (107.049 kg)      Objective:   Physical Exam  Constitutional: She appears well-developed. No distress.  obese  HENT:  Head: Normocephalic.  Right Ear: External ear normal.  Left Ear: External ear normal.  Nose: Nose normal.  Mouth/Throat: Oropharynx is clear and moist.   Eyes: Conjunctivae are normal. Pupils are equal, round, and reactive to light. Right eye exhibits no discharge. Left eye exhibits no discharge.  Neck: Normal range of motion. Neck supple. No JVD present. No tracheal deviation present. No thyromegaly present.  Cardiovascular: Normal rate, regular rhythm and normal heart sounds.   Pulmonary/Chest: No stridor. No respiratory distress. She has no wheezes.  Abdominal: Soft. Bowel sounds are normal. She exhibits no distension and no mass. There is no tenderness. There is no rebound and no guarding.  Musculoskeletal: She exhibits no edema and no tenderness.  Lymphadenopathy:    She has no cervical adenopathy.  Neurological: She displays normal reflexes. No cranial nerve deficit. She exhibits normal muscle tone. Coordination normal.  Skin: No rash noted. No erythema.  Psychiatric: She has a normal mood and affect. Her behavior is normal. Judgment and thought content normal.     Lab Results  Component Value Date   WBC 5.7 06/02/2013   HGB 13.7 06/02/2013   HCT 41.8 06/02/2013   PLT 239.0 06/02/2013   GLUCOSE 105* 09/14/2013   CHOL 155 06/02/2013   TRIG 174.0* 06/02/2013   HDL 28.70* 06/02/2013   LDLDIRECT 127.9 07/14/2011   LDLCALC 92 06/02/2013   ALT 21 02/18/2012   AST 20 02/18/2012   NA 140 09/14/2013   K 4.0 09/14/2013   CL 105 09/14/2013   CREATININE 1.5* 09/14/2013   BUN 25* 09/14/2013   CO2 26 09/14/2013   TSH 2.65 01/07/2011   INR 0.9 RATIO 04/12/2007   HGBA1C 6.6* 09/14/2013        Assessment & Plan:

## 2014-04-24 NOTE — Assessment & Plan Note (Signed)
Continue with current prescription therapy as reflected on the Med list.  

## 2014-04-24 NOTE — Assessment & Plan Note (Addendum)
Will add Hydralazine

## 2014-04-25 ENCOUNTER — Telehealth: Payer: Self-pay | Admitting: Internal Medicine

## 2014-04-25 NOTE — Telephone Encounter (Signed)
Relevant patient education assigned to patient using Emmi. ° °

## 2014-06-07 ENCOUNTER — Emergency Department (HOSPITAL_COMMUNITY): Payer: Medicare HMO

## 2014-06-07 ENCOUNTER — Observation Stay (HOSPITAL_COMMUNITY)
Admission: EM | Admit: 2014-06-07 | Discharge: 2014-06-08 | Disposition: A | Discharge status: Home / Self Care | Payer: Medicare HMO | Attending: Family Medicine | Admitting: Family Medicine

## 2014-06-07 DIAGNOSIS — N189 Chronic kidney disease, unspecified: Secondary | ICD-10-CM | POA: Diagnosis not present

## 2014-06-07 DIAGNOSIS — M79631 Pain in right forearm: Secondary | ICD-10-CM

## 2014-06-07 DIAGNOSIS — R079 Chest pain, unspecified: Principal | ICD-10-CM

## 2014-06-07 DIAGNOSIS — I10 Essential (primary) hypertension: Secondary | ICD-10-CM

## 2014-06-07 DIAGNOSIS — E1129 Type 2 diabetes mellitus with other diabetic kidney complication: Secondary | ICD-10-CM | POA: Diagnosis not present

## 2014-06-07 DIAGNOSIS — R609 Edema, unspecified: Secondary | ICD-10-CM | POA: Diagnosis not present

## 2014-06-07 DIAGNOSIS — M7711 Lateral epicondylitis, right elbow: Secondary | ICD-10-CM

## 2014-06-07 DIAGNOSIS — R109 Unspecified abdominal pain: Secondary | ICD-10-CM

## 2014-06-07 DIAGNOSIS — M25549 Pain in joints of unspecified hand: Secondary | ICD-10-CM | POA: Diagnosis not present

## 2014-06-07 DIAGNOSIS — M7710 Lateral epicondylitis, unspecified elbow: Secondary | ICD-10-CM | POA: Insufficient documentation

## 2014-06-07 DIAGNOSIS — I6529 Occlusion and stenosis of unspecified carotid artery: Secondary | ICD-10-CM | POA: Diagnosis not present

## 2014-06-07 DIAGNOSIS — E538 Deficiency of other specified B group vitamins: Secondary | ICD-10-CM | POA: Diagnosis not present

## 2014-06-07 DIAGNOSIS — E785 Hyperlipidemia, unspecified: Secondary | ICD-10-CM | POA: Diagnosis not present

## 2014-06-07 DIAGNOSIS — I517 Cardiomegaly: Secondary | ICD-10-CM | POA: Diagnosis not present

## 2014-06-07 DIAGNOSIS — Z0000 Encounter for general adult medical examination without abnormal findings: Secondary | ICD-10-CM

## 2014-06-07 DIAGNOSIS — I129 Hypertensive chronic kidney disease with stage 1 through stage 4 chronic kidney disease, or unspecified chronic kidney disease: Secondary | ICD-10-CM | POA: Insufficient documentation

## 2014-06-07 DIAGNOSIS — E1122 Type 2 diabetes mellitus with diabetic chronic kidney disease: Secondary | ICD-10-CM

## 2014-06-07 DIAGNOSIS — M79641 Pain in right hand: Secondary | ICD-10-CM

## 2014-06-07 DIAGNOSIS — M109 Gout, unspecified: Secondary | ICD-10-CM

## 2014-06-07 DIAGNOSIS — N289 Disorder of kidney and ureter, unspecified: Secondary | ICD-10-CM

## 2014-06-07 LAB — POCT I-STAT TROPONIN I: Troponin i, poc: 0 ng/mL (ref 0.00–0.08)

## 2014-06-07 LAB — BASIC METABOLIC PANEL
Anion gap: 15 (ref 5–15)
BUN: 25 mg/dL — ABNORMAL HIGH (ref 6–23)
CHLORIDE: 103 meq/L (ref 96–112)
CO2: 24 meq/L (ref 19–32)
Calcium: 9.6 mg/dL (ref 8.4–10.5)
Creatinine, Ser: 1.37 mg/dL — ABNORMAL HIGH (ref 0.50–1.10)
GFR calc non Af Amer: 38 mL/min — ABNORMAL LOW (ref >90)
GFR, EST AFRICAN AMERICAN: 43 mL/min — AB (ref >90)
Glucose, Bld: 106 mg/dL — ABNORMAL HIGH (ref 70–99)
Potassium: 4.4 mEq/L (ref 3.7–5.3)
SODIUM: 142 meq/L (ref 137–147)

## 2014-06-07 LAB — HEPATIC FUNCTION PANEL
ALT: 22 U/L (ref 0–35)
AST: 19 U/L (ref 0–37)
Albumin: 3.6 g/dL (ref 3.5–5.2)
Alkaline Phosphatase: 89 U/L (ref 39–117)
Bilirubin, Direct: <0.2 mg/dL (ref 0.0–0.3)
TOTAL PROTEIN: 7.3 g/dL (ref 6.0–8.3)
Total Bilirubin: 0.3 mg/dL (ref 0.3–1.2)

## 2014-06-07 LAB — CBC WITH DIFFERENTIAL/PLATELET
Basophils Absolute: 0 10*3/uL (ref 0.0–0.1)
Basophils Relative: 0 % (ref 0–1)
Eosinophils Absolute: 0.1 10*3/uL (ref 0.0–0.7)
Eosinophils Relative: 1 % (ref 0–5)
HEMATOCRIT: 39.3 % (ref 36.0–46.0)
HEMOGLOBIN: 13.1 g/dL (ref 12.0–15.0)
LYMPHS PCT: 17 % (ref 12–46)
Lymphs Abs: 1.5 10*3/uL (ref 0.7–4.0)
MCH: 27.6 pg (ref 26.0–34.0)
MCHC: 33.3 g/dL (ref 30.0–36.0)
MCV: 82.7 fL (ref 78.0–100.0)
MONOS PCT: 8 % (ref 3–12)
Monocytes Absolute: 0.7 10*3/uL (ref 0.1–1.0)
NEUTROS ABS: 6.5 10*3/uL (ref 1.7–7.7)
NEUTROS PCT: 74 % (ref 43–77)
Platelets: 231 10*3/uL (ref 150–400)
RBC: 4.75 MIL/uL (ref 3.87–5.11)
RDW: 14.6 % (ref 11.5–15.5)
WBC: 8.7 10*3/uL (ref 4.0–10.5)

## 2014-06-07 LAB — CBC
HCT: 38.1 % (ref 36.0–46.0)
HEMOGLOBIN: 12.5 g/dL (ref 12.0–15.0)
MCH: 27 pg (ref 26.0–34.0)
MCHC: 32.8 g/dL (ref 30.0–36.0)
MCV: 82.3 fL (ref 78.0–100.0)
Platelets: 245 10*3/uL (ref 150–400)
RBC: 4.63 MIL/uL (ref 3.87–5.11)
RDW: 14.5 % (ref 11.5–15.5)
WBC: 10.2 10*3/uL (ref 4.0–10.5)

## 2014-06-07 LAB — I-STAT TROPONIN, ED: Troponin i, poc: 0 ng/mL (ref 0.00–0.08)

## 2014-06-07 LAB — PROTIME-INR
INR: 0.98 (ref 0.00–1.49)
PROTHROMBIN TIME: 13 s (ref 11.6–15.2)

## 2014-06-07 LAB — TROPONIN I: Troponin I: <0.3 ng/mL (ref <0.30)

## 2014-06-07 LAB — APTT: aPTT: 29 seconds (ref 24–37)

## 2014-06-07 LAB — PRO B NATRIURETIC PEPTIDE: Pro B Natriuretic peptide (BNP): 126.5 pg/mL — ABNORMAL HIGH (ref 0–125)

## 2014-06-07 MED ORDER — SODIUM CHLORIDE 0.9 % IV SOLN
20.0000 mL | INTRAVENOUS | Status: DC
  Administered 2014-06-07: 10 mL via INTRAVENOUS

## 2014-06-07 MED ORDER — CARVEDILOL 25 MG PO TABS
25.0000 mg | ORAL_TABLET | Freq: Two times a day (BID) | ORAL | Status: DC
  Administered 2014-06-08 (×2): 25 mg via ORAL
  Filled 2014-06-07 (×3): qty 1

## 2014-06-07 MED ORDER — MORPHINE SULFATE 4 MG/ML IJ SOLN: 4.0000 mg | Freq: Once | INTRAMUSCULAR | Status: DC

## 2014-06-07 MED ORDER — ONDANSETRON HCL 4 MG/2ML IJ SOLN
4.0000 mg | Freq: Four times a day (QID) | INTRAMUSCULAR | Status: DC | PRN
  Administered 2014-06-07: 4 mg via INTRAVENOUS
  Filled 2014-06-07: qty 2

## 2014-06-07 MED ORDER — FEBUXOSTAT 80 MG PO TABS
80.0000 mg | ORAL_TABLET | Freq: Every day | ORAL | Status: DC
  Administered 2014-06-08: 80 mg via ORAL
  Filled 2014-06-07 (×2): qty 1

## 2014-06-07 MED ORDER — KETOROLAC TROMETHAMINE 30 MG/ML IJ SOLN
30.0000 mg | Freq: Once | INTRAMUSCULAR | Status: AC
  Administered 2014-06-07: 30 mg via INTRAVENOUS
  Filled 2014-06-07: qty 1

## 2014-06-07 MED ORDER — MORPHINE SULFATE 4 MG/ML IJ SOLN
4.0000 mg | Freq: Once | INTRAMUSCULAR | Status: AC
  Administered 2014-06-07: 4 mg via INTRAVENOUS
  Filled 2014-06-07: qty 1

## 2014-06-07 MED ORDER — ENOXAPARIN SODIUM 40 MG/0.4ML ~~LOC~~ SOLN
40.0000 mg | SUBCUTANEOUS | Status: DC
  Administered 2014-06-07: 40 mg via SUBCUTANEOUS
  Filled 2014-06-07 (×2): qty 0.4

## 2014-06-07 MED ORDER — NITROGLYCERIN 2 % TD OINT
1.0000 [in_us] | TOPICAL_OINTMENT | Freq: Once | TRANSDERMAL | Status: AC
  Administered 2014-06-07: 1 [in_us] via TOPICAL
  Filled 2014-06-07: qty 1

## 2014-06-07 MED ORDER — ATORVASTATIN CALCIUM 40 MG PO TABS
40.0000 mg | ORAL_TABLET | Freq: Every day | ORAL | Status: DC
  Administered 2014-06-08: 40 mg via ORAL
  Filled 2014-06-07 (×2): qty 1

## 2014-06-07 MED ORDER — ONDANSETRON HCL 4 MG/2ML IJ SOLN
4.0000 mg | Freq: Once | INTRAMUSCULAR | Status: AC
  Administered 2014-06-07: 4 mg via INTRAVENOUS
  Filled 2014-06-07: qty 2

## 2014-06-07 MED ORDER — HYDRALAZINE HCL 25 MG PO TABS
25.0000 mg | ORAL_TABLET | Freq: Three times a day (TID) | ORAL | Status: DC
  Administered 2014-06-07 – 2014-06-08 (×3): 25 mg via ORAL
  Filled 2014-06-07 (×4): qty 1

## 2014-06-07 MED ORDER — MORPHINE SULFATE 2 MG/ML IJ SOLN
2.0000 mg | Freq: Once | INTRAMUSCULAR | Status: AC
  Administered 2014-06-07: 2 mg via INTRAVENOUS
  Filled 2014-06-07: qty 1

## 2014-06-07 MED ORDER — ACETAMINOPHEN 325 MG PO TABS: 650.0000 mg | ORAL_TABLET | ORAL | Status: DC | PRN

## 2014-06-07 MED ORDER — LABETALOL HCL 5 MG/ML IV SOLN
10.0000 mg | Freq: Once | INTRAVENOUS | Status: AC
  Administered 2014-06-07: 10 mg via INTRAVENOUS
  Filled 2014-06-07: qty 4

## 2014-06-07 NOTE — H&P (Signed)
PCP:   Walker Kehr, MD   Chief Complaint:  Chest pain  HPI: 73 y/o female who    has a past medical history of Hypertension; Hyperlipidemia; Hyperglycemia; Swelling of lower limb; Obesity; CRI (chronic renal insufficiency); Lactose intolerance; Diarrhea; Gout; Macular degeneration; and Diabetes mellitus (2011). Today presents to the ED from urgent care for the chest pain. Pain started on left side, under the breast.She denies nausea, vomiting or diarrhea. She denies shortness of breath. She has ongoing chest pain. In the ED was found to be elevated BP.  She has h/o hypertension, hyperlipidemia.  Allergies:  No Known Allergies    Past Medical History  Diagnosis Date  . Hypertension   . Hyperlipidemia   . Hyperglycemia   . Swelling of lower limb   . Obesity   . CRI (chronic renal insufficiency)     Stage 3  . Lactose intolerance   . Diarrhea   . Gout   . Macular degeneration   . 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 Cesarean section  1969  . Abdominal hysterectomy  1985  . Lumbar laminectomy  2007  . Cataract extraction  2010 & 2011    bilateral  . Pars plana vitrectomy w/ repair of macular hole  2010 & 2011    Bilateral  . Appendectomy  1985    Prior to Admission medications   Medication Sig Start Date End Date Taking? Authorizing Provider  atorvastatin (LIPITOR) 40 MG tablet Take 40 mg by mouth daily. 01/26/14  Yes Aleksei [name] V, MD  carvedilol (COREG) 25 MG tablet Take 25 mg by mouth 2 (two) times daily with a meal. 01/26/14  Yes Aleksei [name] V, MD  Febuxostat 80 MG TABS Take 80 mg by mouth daily. 01/26/14  Yes Aleksei [name] V, MD  Fish Oil-Cholecalciferol (FISH OIL + D3) 1200-1000 MG-UNIT CAPS Take 1 tablet by mouth 2 (two) times daily.    Yes Historical Provider, MD  furosemide (LASIX) 80 MG tablet Take 1 tablet (80 mg total) by mouth daily. 01/26/14  Yes Aleksei [name] V, MD    hydrALAZINE (APRESOLINE) 25 MG tablet Take 1 tablet (25 mg total) by mouth 3 (three) times daily. 04/24/14  Yes Aleksei [name] V, MD  Multiple Vitamins-Minerals (CENTRUM SILVER PO) Take 1 tablet by mouth daily.    Yes Historical Provider, MD  nitroGLYCERIN (NITROSTAT) 0.4 MG SL tablet Place 0.4 mg under the tongue every 5 (five) minutes as needed for chest pain.   Yes Historical Provider, MD  Polyethyl Glycol-Propyl Glycol (SYSTANE FREE OP) Place 1 drop into both eyes daily as needed.   Yes Historical Provider, MD  potassium chloride SA (K-DUR,KLOR-CON) 20 MEQ tablet Take 1 tablet (20 mEq total) by mouth daily. 01/26/14  Yes Aleksei [name] V, MD  vitamin B-12 (CYANOCOBALAMIN) 1000 MCG tablet Take 500 mcg by mouth every other day.    Yes Historical Provider, MD  vitamin C (ASCORBIC ACID) 500 MG tablet Take 500 mg by mouth daily as needed (for cold).    Yes Historical Provider, MD    Social History:  reports that she has never smoked. She has never used smokeless tobacco. She reports that she does not drink alcohol or use illicit drugs.  Family History  Problem Relation Age of Onset  . Cancer Other     Lung  . Hypertension Mother   . Hypertension Father  All the positives are listed in BOLD  Review of Systems:  HEENT: Headache, blurred vision, runny nose, sore throat Neck: Hypothyroidism, hyperthyroidism,,lymphadenopathy Chest : Shortness of breath, history of COPD, Asthma Heart : Chest pain, history of coronary arterey disease GI:  Nausea, vomiting, diarrhea, constipation, GERD GU: Dysuria, urgency, frequency of urination, hematuria Neuro: Stroke, seizures, syncope Psych: Depression, anxiety, hallucinations   Physical Exam: Blood pressure 200/89, pulse 67, temperature 98.3 F (36.8 C), temperature source Oral, resp. rate 12, SpO2 98.00%. Constitutional:   Patient is a well-developed and well-nourished female in no acute distress and cooperative with exam. Head:  Normocephalic and atraumatic Mouth: Mucus membranes moist Eyes: PERRL, EOMI, conjunctivae normal Neck: Supple, No Thyromegaly Cardiovascular: RRR, S1 normal, S2 normal Pulmonary/Chest: CTAB, no wheezes, rales, or rhonchi Abdominal: Soft. Non-tender, non-distended, bowel sounds are normal, no masses, organomegaly, or guarding present.  Neurological: A&O x3, Strenght is normal and symmetric bilaterally, cranial nerve II-XII are grossly intact, no focal motor deficit, sensory intact to light touch bilaterally.  Extremities : No Cyanosis, Clubbing or Edema  Labs on Admission:  Basic Metabolic Panel:  Recent Labs Lab 06/07/14 1459  NA 142  K 4.4  CL 103  CO2 24  GLUCOSE 106*  BUN 25*  CREATININE 1.37*  CALCIUM 9.6   Liver Function Tests:  Recent Labs Lab 06/07/14 1459  AST 19  ALT 22  ALKPHOS 89  BILITOT 0.3  PROT 7.3  ALBUMIN 3.6   No results found for this basename: LIPASE, AMYLASE,  in the last 168 hours No results found for this basename: AMMONIA,  in the last 168 hours CBC:  Recent Labs Lab 06/07/14 1459  WBC 8.7  NEUTROABS 6.5  HGB 13.1  HCT 39.3  MCV 82.7  PLT 231   Cardiac Enzymes: No results found for this basename: CKTOTAL, CKMB, CKMBINDEX, TROPONINI,  in the last 168 hours  BNP (last 3 results)  Recent Labs  06/07/14 1459  PROBNP 126.5*   CBG: No results found for this basename: GLUCAP,  in the last 168 hours  Radiological Exams on Admission: Dg Chest 2 View  06/07/2014   CLINICAL DATA:  Left-sided chest pain without shortness of breath  EXAM: CHEST  2 VIEW  COMPARISON:  PA and lateral chest x-ray of May 28, 2011  FINDINGS: The lungs are adequately inflated. There is no focal infiltrate. The cardiac silhouette is enlarged. The central pulmonary vascularity is prominent. There is no pleural effusion or pneumothorax. The bony thorax is unremarkable.  IMPRESSION: Mild pulmonary vascular prominence with cardiomegaly is consistent with low-grade CHF.  There is no pulmonary edema or pneumonia.   Electronically Signed   By: David  Martinique   On: 06/07/2014 15:12    EKG: Independently reviewed. NSR with Mobitz type 1 block   Assessment/Plan Active Problems:   Chest pain   Hypertension   Hyperlipidemia  Chest pain Will admit the patient under observation with tele,  Cycle cardiac enzymes. Will obtain echocardiogram  Hypertension Restart home medications including Coreg, Hydralzine.  Hyperlipidemia Continue pravachol  Code status: Full code  Family discussion: Admission, patients condition and plan of care including tests being ordered have been discussed with the patient and her husband  who indicate understanding and agree with the plan and Code Status.   Time Spent on Admission: 70 min  [facility] Pager: [phone] 06/07/2014, 5:35 PM  If 7PM-7AM, please contact night-coverage  [email or web address]  Password [email or web address]

## 2014-06-07 NOTE — ED Notes (Signed)
Lab at bedside

## 2014-06-07 NOTE — Progress Notes (Signed)
Pt had sudden vomiting episode after eating meal. Pt states that was the first meal she had eaten all day and states she was given morphine earlier  in the ED and pain medications make her sick. Pain was at an 8/10. EKG obtained. Pain decreased to a 4 without any meds. MD made aware new orders obtained will continue to monitor.

## 2014-06-07 NOTE — ED Notes (Signed)
73 yo female from home with chest pain for 2-3 hours intermittently under left breat. No pain on palpation but feels a tugging sensation. Pain 6/10. At [facility] pt received 324 ASA and 1 Nitor 13:36. 200 palp BP currently on new BP med. EKG 1st degree block with PVC.

## 2014-06-07 NOTE — ED Notes (Signed)
Denies SOB, pain on palpation. Also denies swelling extremities

## 2014-06-07 NOTE — ED Notes (Signed)
MD at bedside. 

## 2014-06-07 NOTE — ED Provider Notes (Signed)
CSN: [id number]     Arrival date & time 06/07/14  1406 History   None    Chief Complaint  Patient presents with  . Chest Pain     (Consider location/radiation/quality/duration/timing/severity/associated sxs/prior Treatment) Patient is a 73 y.o. female presenting with chest pain.  Chest Pain Pain location:  L chest Pain quality: sharp   Pain quality comment:  "pulling" Pain radiates to:  Does not radiate Pain radiates to the back: no   Pain severity:  Moderate Associated symptoms: no abdominal pain, no back pain, no cough, no fever, no headache, no nausea, no numbness, no shortness of breath and not vomiting     Past Medical History  Diagnosis Date  . Hypertension   . Hyperlipidemia   . Hyperglycemia   . Swelling of lower limb   . Obesity   . CRI (chronic renal insufficiency)     Stage 3  . Lactose intolerance   . Diarrhea   . Gout   . Macular degeneration   . 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 Cesarean section  1969  . Abdominal hysterectomy  1985  . Lumbar laminectomy  2007  . Cataract extraction  2010 & 2011    bilateral  . Pars plana vitrectomy w/ repair of macular hole  2010 & 2011    Bilateral  . Appendectomy  1985   Family History  Problem Relation Age of Onset  . Cancer Other     Lung  . Hypertension Mother   . Hypertension Father    History  Substance Use Topics  . Smoking status: Never Smoker   . Smokeless tobacco: Never Used  . Alcohol Use: No   OB History   Grav Para Term Preterm Abortions TAB SAB Ect Mult Living                 Review of Systems  Constitutional: Negative for fever and chills.  HENT: Negative for sore throat.   Eyes: Negative for pain.  Respiratory: Negative for cough and shortness of breath.   Cardiovascular: Positive for chest pain.  Gastrointestinal: Negative for nausea, vomiting, abdominal pain and diarrhea.  Genitourinary: Negative for dysuria.   Musculoskeletal: Negative for back pain.  Skin: Negative for rash.  Neurological: Negative for numbness and headaches.      Allergies  Review of patient's allergies indicates no known allergies.  Home Medications   Prior to Admission medications   Medication Sig Start Date End Date Taking? Authorizing Provider  atorvastatin (LIPITOR) 40 MG tablet Take 40 mg by mouth daily. 01/26/14  Yes Aleksei [name] V, MD  carvedilol (COREG) 25 MG tablet Take 25 mg by mouth 2 (two) times daily with a meal. 01/26/14  Yes Aleksei [name] V, MD  Febuxostat 80 MG TABS Take 80 mg by mouth daily. 01/26/14  Yes Aleksei [name] V, MD  Fish Oil-Cholecalciferol (FISH OIL + D3) 1200-1000 MG-UNIT CAPS Take 1 tablet by mouth 2 (two) times daily.    Yes Historical Provider, MD  furosemide (LASIX) 80 MG tablet Take 1 tablet (80 mg total) by mouth daily. 01/26/14  Yes Aleksei [name] V, MD  hydrALAZINE (APRESOLINE) 25 MG tablet Take 1 tablet (25 mg total) by mouth 3 (three) times daily. 04/24/14  Yes Aleksei [name] V, MD  Multiple Vitamins-Minerals (CENTRUM SILVER PO) Take 1 tablet by mouth daily.    Yes Historical Provider, MD  nitroGLYCERIN (NITROSTAT) 0.4 MG SL  tablet Place 0.4 mg under the tongue every 5 (five) minutes as needed for chest pain.   Yes Historical Provider, MD  Polyethyl Glycol-Propyl Glycol (SYSTANE FREE OP) Place 1 drop into both eyes daily as needed.   Yes Historical Provider, MD  potassium chloride SA (K-DUR,KLOR-CON) 20 MEQ tablet Take 1 tablet (20 mEq total) by mouth daily. 01/26/14  Yes Aleksei [name] V, MD  vitamin B-12 (CYANOCOBALAMIN) 1000 MCG tablet Take 500 mcg by mouth every other day.    Yes Historical Provider, MD  vitamin C (ASCORBIC ACID) 500 MG tablet Take 500 mg by mouth daily as needed (for cold).    Yes Historical Provider, MD   BP 147/59  Pulse 85  Temp(Src) 98.1 F (36.7 C) (Oral)  Resp 16  Ht 5\' 3"  (1.6 m)  Wt 235 lb (106.595 kg)  BMI 41.64 kg/m2  SpO2 94% Physical  Exam  Constitutional: She is oriented to person, place, and time. She appears well-developed and well-nourished. No distress.  HENT:  Head: Normocephalic and atraumatic.  Eyes: Pupils are equal, round, and reactive to light. Right eye exhibits no discharge. Left eye exhibits no discharge.  Neck: Normal range of motion.  Cardiovascular: Normal rate, regular rhythm and normal heart sounds.   Pulmonary/Chest: Effort normal and breath sounds normal.  Abdominal: Soft. She exhibits no distension. There is no tenderness.  Musculoskeletal: Normal range of motion.  Neurological: She is alert and oriented to person, place, and time.  Skin: Skin is warm. She is not diaphoretic.    ED Course  Procedures (including critical care time) Labs Review Labs Reviewed  BASIC METABOLIC PANEL - Abnormal; Notable for the following:    Glucose, Bld 106 (*)    BUN 25 (*)    Creatinine, Ser 1.37 (*)    GFR calc non Af Amer 38 (*)    GFR calc Af Amer 43 (*)    All other components within normal limits  PRO B NATRIURETIC PEPTIDE - Abnormal; Notable for the following:    Pro B Natriuretic peptide (BNP) 126.5 (*)    All other components within normal limits  CBC WITH DIFFERENTIAL  APTT  HEPATIC FUNCTION PANEL  PROTIME-INR  TROPONIN I  CBC  TROPONIN I  TROPONIN I  I-STAT TROPOININ, ED  POCT I-STAT TROPONIN I    Imaging Review Dg Chest 2 View  06/07/2014   CLINICAL DATA:  Left-sided chest pain without shortness of breath  EXAM: CHEST  2 VIEW  COMPARISON:  PA and lateral chest x-ray of May 28, 2011  FINDINGS: The lungs are adequately inflated. There is no focal infiltrate. The cardiac silhouette is enlarged. The central pulmonary vascularity is prominent. There is no pleural effusion or pneumothorax. The bony thorax is unremarkable.  IMPRESSION: Mild pulmonary vascular prominence with cardiomegaly is consistent with low-grade CHF. There is no pulmonary edema or pneumonia.   Electronically Signed   By: David   Martinique   On: 06/07/2014 15:12     EKG Interpretation None      MDM   Final diagnoses:  Chest pain, unspecified chest pain type  Gout, unspecified   73 yo F with HTN, HLD, obesity presents with L-sided chest pain without N/V or diaphoresis, not relieved by aspirin or nitro en route from OSH.    Upon arrival, the patient is hemodynamically stable. She appears in no acute distress at this time. She states she still having chest pain which she rates as 7/10.  Patient was are given aspirin.  We'll give nitroglycerin. Plan is to obtain basic labs. EKG reviewed.   Patient has significant risk factors for coronary artery disease, including hypertension, hyperlipidemia, obesity, age greater than 24. Given his risk factors and the patient's continued chest pain, plan is to admit the patient to the hospital to rule out ACS. Patient was triaged to the hospitalist service. Patient was admitted to the floor in stable condition. Patient seen and evaluated by myself and my attending, Dr. Jeanell Sparrow.      Freddi Che, MD 06/08/14 [id number]

## 2014-06-07 NOTE — ED Provider Notes (Signed)
MSE was initiated and I personally evaluated the patient and placed orders (if any) at  2:30 PM on June 07, 2014.  2:31 PM patient is not in [facility] [place] is a 73 y.o. female complaining of left-sided lower, nonradiating chest pain described as pulling like a muscle pulling onset at 10:30 AM when patient was sitting down at a computer. She rates this pain as 6-8/10. Is not associated with any nausea, vomiting, diaphoresis, shortness of breath, fever, chills, calf pain or leg swelling. No prior cardiac history, has never had a stress test. Cannot identify anything that makes the pain better or worse. Patient was seen at [facility] and  given full dose aspirin, she was given one sublingual nitroglycerin with little relief.  Patient seen and evaluated the bedside, she is obese. Heart is a regular rate and rhythm with no murmurs rubs or gallops, lung sounds are clear to auscultation bilaterally. Abdominal exam is benign with no tenderness to palpation, guarding or rebound. There is no peripheral edema. There is no JVD. No calf asymmetry, superficial collaterals, palpable cords, edema, Homans sign negative bilaterally.   EKG from primary care shows Mobitz type I with dropped beats.  Patient with elevated blood pressure systolic in the [id number], patient will be given nitro paste. Cardiac workup initiated  The patient appears stable so that the remainder of the MSE may be completed by another provider.  Monico Blitz, PA-C 06/07/14 1511

## 2014-06-08 ENCOUNTER — Observation Stay (HOSPITAL_COMMUNITY): Payer: Medicare HMO

## 2014-06-08 ENCOUNTER — Encounter (HOSPITAL_COMMUNITY): Payer: Self-pay | Admitting: *Deleted

## 2014-06-08 DIAGNOSIS — R079 Chest pain, unspecified: Secondary | ICD-10-CM

## 2014-06-08 DIAGNOSIS — I10 Essential (primary) hypertension: Secondary | ICD-10-CM

## 2014-06-08 DIAGNOSIS — E1129 Type 2 diabetes mellitus with other diabetic kidney complication: Secondary | ICD-10-CM | POA: Diagnosis not present

## 2014-06-08 DIAGNOSIS — N189 Chronic kidney disease, unspecified: Secondary | ICD-10-CM

## 2014-06-08 DIAGNOSIS — I517 Cardiomegaly: Secondary | ICD-10-CM

## 2014-06-08 LAB — TROPONIN I: Troponin I: <0.3 ng/mL (ref <0.30)

## 2014-06-08 MED ORDER — ACETAMINOPHEN 325 MG PO TABS: 650.0000 mg | ORAL_TABLET | Freq: Four times a day (QID) | ORAL | Status: DC | PRN

## 2014-06-08 MED ORDER — IOHEXOL 350 MG/ML SOLN
80.0000 mL | Freq: Once | INTRAVENOUS | Status: AC | PRN
  Administered 2014-06-08: 80 mL via INTRAVENOUS

## 2014-06-08 MED ORDER — TECHNETIUM TC 99M SESTAMIBI GENERIC - CARDIOLITE
30.0000 | Freq: Once | INTRAVENOUS | Status: AC | PRN
  Administered 2014-06-08: 30 via INTRAVENOUS

## 2014-06-08 MED ORDER — TECHNETIUM TC 99M SESTAMIBI GENERIC - CARDIOLITE
10.0000 | Freq: Once | INTRAVENOUS | Status: AC | PRN
  Administered 2014-06-08: 10 via INTRAVENOUS

## 2014-06-08 MED ORDER — REGADENOSON 0.4 MG/5ML IV SOLN
0.4000 mg | Freq: Once | INTRAVENOUS | Status: AC
  Administered 2014-06-08: 0.4 mg via INTRAVENOUS

## 2014-06-08 MED ORDER — REGADENOSON 0.4 MG/5ML IV SOLN
INTRAVENOUS | Status: AC
  Administered 2014-06-08: 0.4 mg via INTRAVENOUS
  Filled 2014-06-08: qty 5

## 2014-06-08 NOTE — Progress Notes (Signed)
Pt given discharge instructions with understanding. Husband at bedside. D/c IV and monitor. Pt has no questions at this time. Pt  wheeled out by staff.

## 2014-06-08 NOTE — Consult Note (Addendum)
Admit date: 06/07/2014 Referring Physician  Dr. Darrick Meigs Primary Physician Walker Kehr, MD Primary Cardiologist  None Reason for Consultation  Chest pain  HPI: 73 year-old female with hypertension, hyperlipidemia, chronic kidney disease stage III, diabetes and obesity who presented from urgent care Center with chest discomfort on 06/07/14, yesterday. She stated that her pain started on the left side under her breast without any associated shortness of breath vomiting. She was having ongoing chest discomfort and in the emergency department was found to have elevated blood pressure. Currently, she is chest pain-free. Her chest pain at the most was moderate in intensity.  She has had no prior cardiac history.    PMH:   Past Medical History  Diagnosis Date  . Hypertension   . Hyperlipidemia   . Hyperglycemia   . Swelling of lower limb   . Obesity   . CRI (chronic renal insufficiency)     Stage 3  . Lactose intolerance   . Diarrhea   . Gout   . Macular degeneration   . Diabetes mellitus 2011    Type II diet controlled    PSH:   Past Surgical History  Procedure Laterality Date  . Cesarean section  1969  . Abdominal hysterectomy  1985  . Lumbar laminectomy  2007  . Cataract extraction  2010 & 2011    bilateral  . Pars plana vitrectomy w/ repair of macular hole  2010 & 2011    Bilateral  . Appendectomy  1985   Allergies:  Review of patient's allergies indicates no known allergies. Prior to Admit Meds:   Prior to Admission medications   Medication Sig Start Date End Date Taking? Authorizing Provider  atorvastatin (LIPITOR) 40 MG tablet Take 40 mg by mouth daily. 01/26/14  Yes Aleksei [name] V, MD  carvedilol (COREG) 25 MG tablet Take 25 mg by mouth 2 (two) times daily with a meal. 01/26/14  Yes Aleksei [name] V, MD  Febuxostat 80 MG TABS Take 80 mg by mouth daily. 01/26/14  Yes Aleksei [name] V, MD  Fish Oil-Cholecalciferol (FISH OIL + D3) 1200-1000 MG-UNIT CAPS Take 1  tablet by mouth 2 (two) times daily.    Yes Historical Provider, MD  furosemide (LASIX) 80 MG tablet Take 1 tablet (80 mg total) by mouth daily. 01/26/14  Yes Aleksei [name] V, MD  hydrALAZINE (APRESOLINE) 25 MG tablet Take 1 tablet (25 mg total) by mouth 3 (three) times daily. 04/24/14  Yes Aleksei [name] V, MD  Multiple Vitamins-Minerals (CENTRUM SILVER PO) Take 1 tablet by mouth daily.    Yes Historical Provider, MD  nitroGLYCERIN (NITROSTAT) 0.4 MG SL tablet Place 0.4 mg under the tongue every 5 (five) minutes as needed for chest pain.   Yes Historical Provider, MD  Polyethyl Glycol-Propyl Glycol (SYSTANE FREE OP) Place 1 drop into both eyes daily as needed.   Yes Historical Provider, MD  potassium chloride SA (K-DUR,KLOR-CON) 20 MEQ tablet Take 1 tablet (20 mEq total) by mouth daily. 01/26/14  Yes Aleksei [name] V, MD  vitamin B-12 (CYANOCOBALAMIN) 1000 MCG tablet Take 500 mcg by mouth every other day.    Yes Historical Provider, MD  vitamin C (ASCORBIC ACID) 500 MG tablet Take 500 mg by mouth daily as needed (for cold).    Yes Historical Provider, MD   Fam HX:    Family History  Problem Relation Age of Onset  . Cancer Other     Lung  . Hypertension Mother   . Hypertension Father  Social HX:    History   Social History  . Marital Status: Married    Spouse Name: N/A    Number of Children: N/A  . Years of Education: N/A   Occupational History  . Not on file.   Social History Main Topics  . Smoking status: Never Smoker   . Smokeless tobacco: Never Used  . Alcohol Use: No  . Drug Use: No  . Sexual Activity: Not on file   Other Topics Concern  . Not on file   Social History Narrative  . No narrative on file     ROS:  All 11 ROS were addressed and are negative except what is stated in the HPI   Physical Exam: Blood pressure 161/67, pulse 78, temperature 98.2 F (36.8 C), temperature source Oral, resp. rate 16, height 5\' 3"  (1.6 m), weight 235 lb (106.595 kg), SpO2  93.00%.   General: Well developed, well nourished, in no acute distress Head: Eyes PERRLA, No xanthomas.   Normal cephalic and atramatic  Lungs:   Clear bilaterally to auscultation and percussion. Normal respiratory effort. No wheezes, no rales. Heart:   HRRR S1 S2 Pulses are 2+ & equal. No murmur, rubs, gallops.  No carotid bruit. No JVD.  No abdominal bruits.  Abdomen: Bowel sounds are positive, abdomen soft and non-tender without masses. No hepatosplenomegaly. Obese Msk:  Back normal. Normal strength and tone for age. Extremities:  No clubbing, cyanosis or edema.  DP +1 Neuro: Alert and oriented X 3, non-focal, MAE x 4 GU: Deferred Rectal: Deferred Psych:  Good affect, responds appropriately      Labs: Lab Results  Component Value Date   WBC 10.2 06/07/2014   HGB 12.5 06/07/2014   HCT 38.1 06/07/2014   MCV 82.3 06/07/2014   PLT 245 06/07/2014     Recent Labs Lab 06/07/14 1459  NA 142  K 4.4  CL 103  CO2 24  BUN 25*  CREATININE 1.37*  CALCIUM 9.6  PROT 7.3  BILITOT 0.3  ALKPHOS 89  ALT 22  AST 19  GLUCOSE 106*    Recent Labs  06/07/14 2037 06/08/14 0025 06/08/14 0624  TROPONINI <0.30 <0.30 <0.30   Lab Results  Component Value Date   CHOL 155 06/02/2013   HDL 28.70* 06/02/2013   LDLCALC 92 06/02/2013   TRIG 174.0* 06/02/2013      Radiology:  Dg Chest 2 View  06/07/2014   CLINICAL DATA:  Left-sided chest pain without shortness of breath  EXAM: CHEST  2 VIEW  COMPARISON:  PA and lateral chest x-ray of May 28, 2011  FINDINGS: The lungs are adequately inflated. There is no focal infiltrate. The cardiac silhouette is enlarged. The central pulmonary vascularity is prominent. There is no pleural effusion or pneumothorax. The bony thorax is unremarkable.  IMPRESSION: Mild pulmonary vascular prominence with cardiomegaly is consistent with low-grade CHF. There is no pulmonary edema or pneumonia.   Electronically Signed   By: David  Martinique   On: 06/07/2014 15:12    Personally viewed.  EKG:  06/07/14-Sinus rhythm 73 with mild T wave inversion in 1 and aVL. Fairly nonspecific changes. Personally viewed.   ASSESSMENT/PLAN:    73 year old female with diabetes, obese, hypertension, hyperlipidemia here with atypical chest pain, troponin negative.  1. Atypical chest pain-given her multiple comorbidities including diabetes which is a coronary artery disease equivalent, we will proceed with nuclear stress test for further risk stratification. I've explained test to her. They're willing to proceed. There is  a possibility of musculoskeletal discomfort or perhaps gastrointestinal. Her EKG does show mild T wave inversion in the lateral leads however. Chest x-ray fairly unremarkable. There may be some mild pulmonary vascular congestion. She does not complain of any shortness of breath currently.  This could also be secondary to gastroenteritis given her previous vomiting overnight. Certainly this can be a cause of chest pain.  2. Diabetes-per primary team. Medications reviewed.  3. Hypertension-mildly elevated currently. Continue to monitor and adjust medications as necessary per primary team.  4. Hyperlipidemia-atorvastatin. LDL goal 70 with her diabetes. Currently LDL 92.  5. Morbid obesity-complications with diabetes, chronic kidney disease, hyperlipidemia. Continue to encourage weight loss. Dietary plan.  Candee Furbish, MD  06/08/2014  9:22 AM

## 2014-06-08 NOTE — ED Provider Notes (Signed)
Date: 06/08/2014  Rate: 54  Rhythm: normal sinus rhythm  QRS Axis: normal  Intervals: normal  ST/T Wave abnormalities: nonspecific ST/T changes  Conduction Disutrbances:first-degree A-V block   Narrative Interpretation:   Old EKG Reviewed: changes noted rate has slowed   73 y.o. Female with chest pain with nondiagnostic ekg.  First troponin negative and patient admitted for further evaluation.   I performed a history and physical examination of Bridget Oliver and discussed her management with Dr. Silvio Clayman.  I agree with the history, physical, assessment, and plan of care, with the following exceptions: None  I was present for the following procedures: None Time Spent in Critical Care of the patient: None Time spent in discussions with the patient and family: [place], MD 06/08/14 1654

## 2014-06-08 NOTE — Progress Notes (Signed)
Utilization review completed.  

## 2014-06-08 NOTE — Progress Notes (Signed)
  Echocardiogram 2D Echocardiogram has been performed.  Bridget Oliver 06/08/2014, 4:32 PM

## 2014-06-08 NOTE — Progress Notes (Signed)
I have ordered a NUC stress test after review of Dr. Toney Sang note.  Bridget Furbish, MD

## 2014-06-08 NOTE — Progress Notes (Signed)
Subjective: 73 year old female admitted with chest pain, underwent nuclear cardiac stress test which was negative for ischemia. Cardiac enzymes x3 were negative. Patient continues to have left-sided intermittent chest pain under the left breast. Filed Vitals:   06/08/14 1147  BP: 154/53  Pulse: 69  Temp: 98.6 F (37 C)  Resp: 15    Chest: Clear Bilaterally Heart : S1S2 RRR Abdomen: Soft, nontender Ext : No edema Neuro: Alert, oriented x 3  A/P Ongoing chest pain Patient has ongoing chest pain, though it is intermittent.  Nuclear stress test is negative for ischemia Patient recently had long trip journey by car more than 5 hours, one week ago. Will order CT angiography chest to rule out underlying pulmonary embolism. If negative, will discharge the patient home today. Patient's GFR is 45, called and discussed with radiologist. Will be okay to do CT angiogram.  Oswald Hillock [facility] [phone]

## 2014-06-08 NOTE — Progress Notes (Signed)
Nitropaste removed for stress test.States has left chest pain lower and lateral 5/10 since 7am. Did not tell nurse because the pain comes and goes. Not distressed. No SOB. Color pink and skin warm and dry. Denies other symptoms.

## 2014-06-18 NOTE — Discharge Summary (Addendum)
Physician Discharge Summary  Bridget Oliver [id number] DOB: [birth year] DOA: 06/07/2014  PCP: Walker Kehr, MD  Admit date: 06/07/2014 Discharge date: 06/18/2014  Time spent: *50 minutes  Recommendations for Outpatient Follow-up:  1. Follow up PCP in 2 weeks  Discharge Diagnoses:  Active Problems:   Chest pain   Discharge Condition: *Stable  Diet recommendation: low salt diet  Filed Weights   06/07/14 1830  Weight: 106.595 kg (235 lb)    History of present illness:   73 y/o female who has a past medical history of Hypertension; Hyperlipidemia; Hyperglycemia; Swelling of lower limb; Obesity; CRI (chronic renal insufficiency); Lactose intolerance; Diarrhea; Gout; Macular degeneration; and Diabetes mellitus (2011).  Today presents to the ED from urgent care for the chest pain. Pain started on left side, under the breast.She denies nausea, vomiting or diarrhea. She denies shortness of breath. She has ongoing chest pain. In the ED was found to be elevated BP.  She has h/o hypertension, hyperlipidemia.   Hospital Course:  ? Atypical chest pain Patient had ongoing chest pain, though it is intermittent.  Nuclear stress test was  negative for ischemia  CT angio was negative for PE. Will discharge home     Procedures:  None  Consultations:  Cardiology  Discharge Exam: Filed Vitals:   06/08/14 1147  BP: 154/53  Pulse: 69  Temp: 98.6 F (37 C)  Resp: 15      Discharge Instructions You were cared for by a hospitalist during your hospital stay. If you have any questions about your discharge medications or the care you received while you were in the hospital after you are discharged, you can call the unit and asked to speak with the hospitalist on call if the hospitalist that took care of you is not available. Once you are discharged, your primary care physician will handle any further medical issues. Please note that NO REFILLS for any discharge medications will be  authorized once you are discharged, as it is imperative that you return to your primary care physician (or establish a relationship with a primary care physician if you do not have one) for your aftercare needs so that they can reassess your need for medications and monitor your lab values.  Discharge Instructions   Diet - low sodium heart healthy    Complete by:  As directed      Increase activity slowly    Complete by:  As directed             Medication List         acetaminophen 325 MG tablet  Commonly known as:  TYLENOL  Take 2 tablets (650 mg total) by mouth every 6 (six) hours as needed for moderate pain.     atorvastatin 40 MG tablet  Commonly known as:  LIPITOR  Take 40 mg by mouth daily.     carvedilol 25 MG tablet  Commonly known as:  COREG  Take 25 mg by mouth 2 (two) times daily with a meal.     CENTRUM SILVER PO  Take 1 tablet by mouth daily.     Febuxostat 80 MG Tabs  Take 80 mg by mouth daily.     FISH OIL + D3 1200-1000 MG-UNIT Caps  Take 1 tablet by mouth 2 (two) times daily.     furosemide 80 MG tablet  Commonly known as:  LASIX  Take 1 tablet (80 mg total) by mouth daily.     hydrALAZINE 25 MG tablet  Commonly  known as:  APRESOLINE  Take 1 tablet (25 mg total) by mouth 3 (three) times daily.     nitroGLYCERIN 0.4 MG SL tablet  Commonly known as:  NITROSTAT  Place 0.4 mg under the tongue every 5 (five) minutes as needed for chest pain.     potassium chloride SA 20 MEQ tablet  Commonly known as:  K-DUR,KLOR-CON  Take 1 tablet (20 mEq total) by mouth daily.     SYSTANE FREE OP  Place 1 drop into both eyes daily as needed.     vitamin B-12 1000 MCG tablet  Commonly known as:  CYANOCOBALAMIN  Take 500 mcg by mouth every other day.     vitamin C 500 MG tablet  Commonly known as:  ASCORBIC ACID  Take 500 mg by mouth daily as needed (for cold).       No Known Allergies    The results of significant diagnostics from this hospitalization  (including imaging, microbiology, ancillary and laboratory) are listed below for reference.    Significant Diagnostic Studies: Dg Chest 2 View  06/07/2014   CLINICAL DATA:  Left-sided chest pain without shortness of breath  EXAM: CHEST  2 VIEW  COMPARISON:  PA and lateral chest x-ray of May 28, 2011  FINDINGS: The lungs are adequately inflated. There is no focal infiltrate. The cardiac silhouette is enlarged. The central pulmonary vascularity is prominent. There is no pleural effusion or pneumothorax. The bony thorax is unremarkable.  IMPRESSION: Mild pulmonary vascular prominence with cardiomegaly is consistent with low-grade CHF. There is no pulmonary edema or pneumonia.   Electronically Signed   By: David  Martinique   On: 06/07/2014 15:12   Ct Angio Chest Pe W/cm &/or Wo Cm  06/08/2014   CLINICAL DATA:  Left lateral chest pain.  Shortness of breath.  EXAM: CT ANGIOGRAPHY CHEST WITH CONTRAST  TECHNIQUE: Multidetector CT imaging of the chest was performed using the standard protocol during bolus administration of intravenous contrast. Multiplanar CT image reconstructions and MIPs were obtained to evaluate the vascular anatomy.  CONTRAST:  77mL OMNIPAQUE IOHEXOL 350 MG/ML SOLN  COMPARISON:  06/07/2014  FINDINGS: Body habitus reduces diagnostic sensitivity and specificity. Despite efforts by the technologist and patient, motion artifact is present on today's exam and could not be eliminated. This reduces exam sensitivity and specificity.  No filling defect is identified in the pulmonary arterial tree to suggest pulmonary embolus. 10 mm left AP window lymph node. Mild cardiomegaly. The lungs appear clear.  Mild thoracic spondylosis.  Review of the MIP images confirms the above findings.  IMPRESSION: 1. No embolus identified. Reduced negative predictive value due to body habitus and breathing motion artifact. 2. Borderline enlarged AP window lymph node, significance uncertain. 3. Mild thoracic spondylosis. 4. Mild  cardiomegaly.   Electronically Signed   By: Sherryl Barters M.D.   On: 06/08/2014 18:43   Nm Myocar Multi W/spect W/wall Motion / Ef  06/08/2014   CLINICAL DATA:  73 year old with current history of hypertension, diabetes and hyperlipidemia, presenting with chest pain.  EXAM: MYOCARDIAL IMAGING WITH SPECT (REST AND PHARMACOLOGIC-STRESS)  GATED LEFT VENTRICULAR WALL MOTION STUDY  LEFT VENTRICULAR EJECTION FRACTION  TECHNIQUE: Standard myocardial SPECT imaging was performed after resting intravenous injection of 10 mCi Tc-17m sestamibi. Subsequently, intravenous infusion of Lexiscan was performed under the supervision of the Cardiology staff. At peak effect of the drug, 30 mCi Tc-83m sestamibi was injected intravenously and standard myocardial SPECT imaging was performed. Quantitative gated imaging was also performed to  evaluate left ventricular wall motion, and estimate left ventricular ejection fraction.  COMPARISON:  None.  FINDINGS: Immediate post regadenoson images demonstrate no significant focal perfusion defects. Initial resting images demonstrate similar findings. No evidence of reversibility to suggest ischemia. Findings confirmed by the computer generated polar map.  Gated images demonstrate satisfactory thickening throughout the left ventricular myocardium with normal wall motion throughout.  Estimated QGS left ventricular ejection fraction measured 81%, with an end-diastolic volume of 63 ml and an end systolic volume of 12 ml.  IMPRESSION: 1. No evidence of myocardial ischemia or infarction. 2. Normal left ventricular wall motion. 3. Estimated QGS ejection fraction overestimated at 81%, possibly related to left ventricular hypertrophy.   Electronically Signed   By: Evangeline Dakin M.D.   On: 06/08/2014 14:37    Microbiology: No results found for this or any previous visit (from the past 240 hour(s)).   Labs: Basic Metabolic Panel: No results found for this basename: NA, K, CL, CO2, GLUCOSE,  BUN, CREATININE, CALCIUM, MG, PHOS,  in the last 168 hours Liver Function Tests: No results found for this basename: AST, ALT, ALKPHOS, BILITOT, PROT, ALBUMIN,  in the last 168 hours No results found for this basename: LIPASE, AMYLASE,  in the last 168 hours No results found for this basename: AMMONIA,  in the last 168 hours CBC: No results found for this basename: WBC, NEUTROABS, HGB, HCT, MCV, PLT,  in the last 168 hours Cardiac Enzymes: No results found for this basename: CKTOTAL, CKMB, CKMBINDEX, TROPONINI,  in the last 168 hours BNP: BNP (last 3 results)  Recent Labs  06/07/14 1459  PROBNP 126.5*   CBG: No results found for this basename: GLUCAP,  in the last 168 hours     Signed:  Biana Haggar S  [facility] 06/18/2014, 8:25 PM

## 2014-07-25 ENCOUNTER — Ambulatory Visit (INDEPENDENT_AMBULATORY_CARE_PROVIDER_SITE_OTHER): Payer: Commercial Managed Care - HMO | Admitting: Internal Medicine

## 2014-07-25 ENCOUNTER — Encounter: Payer: Self-pay | Admitting: Internal Medicine

## 2014-07-25 ENCOUNTER — Other Ambulatory Visit (INDEPENDENT_AMBULATORY_CARE_PROVIDER_SITE_OTHER): Payer: Commercial Managed Care - HMO

## 2014-07-25 VITALS — BP 140/92 | Temp 98.3°F | Wt 230.0 lb

## 2014-07-25 DIAGNOSIS — E1129 Type 2 diabetes mellitus with other diabetic kidney complication: Secondary | ICD-10-CM

## 2014-07-25 DIAGNOSIS — I10 Essential (primary) hypertension: Secondary | ICD-10-CM

## 2014-07-25 DIAGNOSIS — N289 Disorder of kidney and ureter, unspecified: Secondary | ICD-10-CM

## 2014-07-25 DIAGNOSIS — M545 Low back pain, unspecified: Secondary | ICD-10-CM

## 2014-07-25 DIAGNOSIS — E538 Deficiency of other specified B group vitamins: Secondary | ICD-10-CM

## 2014-07-25 DIAGNOSIS — E1121 Type 2 diabetes mellitus with diabetic nephropathy: Secondary | ICD-10-CM

## 2014-07-25 DIAGNOSIS — N058 Unspecified nephritic syndrome with other morphologic changes: Secondary | ICD-10-CM

## 2014-07-25 DIAGNOSIS — Z23 Encounter for immunization: Secondary | ICD-10-CM

## 2014-07-25 LAB — URINALYSIS, ROUTINE W REFLEX MICROSCOPIC
Bilirubin Urine: NEGATIVE
HGB URINE DIPSTICK: NEGATIVE
KETONES UR: NEGATIVE
Nitrite: NEGATIVE
RBC / HPF: NONE SEEN (ref 0–?)
SPECIFIC GRAVITY, URINE: 1.015 (ref 1.000–1.030)
Total Protein, Urine: NEGATIVE
URINE GLUCOSE: NEGATIVE
Urobilinogen, UA: 0.2 (ref 0.0–1.0)
pH: 5 (ref 5.0–8.0)

## 2014-07-25 NOTE — Progress Notes (Signed)
Subjective:    HPI  F/u elevated BP at times - better C/o LBP at times x 3 d The patient presents for a follow-up of  chronic hypertension, CRF, chronic dyslipidemia, type 2, gout diabetes controlled with medicines.   Off Metformin   BP Readings from Last 3 Encounters:  07/25/14 140/92  06/08/14 154/53  04/24/14 140/80   Wt Readings from Last 3 Encounters:  07/25/14 230 lb (104.327 kg)  06/07/14 235 lb (106.595 kg)  04/24/14 236 lb (107.049 kg)    Past Medical History  Diagnosis Date  . Hypertension   . Hyperlipidemia   . Hyperglycemia   . Swelling of lower limb   . Obesity   . CRI (chronic renal insufficiency)     Stage 3  . Lactose intolerance   . Diarrhea   . Gout   . Macular degeneration   . 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 Cesarean section  1969  . Abdominal hysterectomy  1985  . Lumbar laminectomy  2007  . Cataract extraction  2010 & 2011    bilateral  . Pars plana vitrectomy w/ repair of macular hole  2010 & 2011    Bilateral  . Appendectomy  1985    reports that she has never smoked. She has never used smokeless tobacco. She reports that she does not drink alcohol or use illicit drugs. family history includes Cancer in her other; Hypertension in her father and mother. No Known Allergies  Current Outpatient Prescriptions on File Prior to Visit  Medication Sig Dispense Refill  . acetaminophen (TYLENOL) 325 MG tablet Take 2 tablets (650 mg total) by mouth every 6 (six) hours as needed for moderate pain.  30 tablet  0  . atorvastatin (LIPITOR) 40 MG tablet Take 40 mg by mouth daily.      . carvedilol (COREG) 25 MG tablet Take 25 mg by mouth 2 (two) times daily with a meal.      . Febuxostat 80 MG TABS Take 80 mg by mouth daily.      . Fish Oil-Cholecalciferol (FISH OIL + D3) 1200-1000 MG-UNIT CAPS Take 1 tablet by mouth 2 (two) times daily.       . furosemide (LASIX) 80 MG tablet  Take 1 tablet (80 mg total) by mouth daily.  90 tablet  3  . hydrALAZINE (APRESOLINE) 25 MG tablet Take 1 tablet (25 mg total) by mouth 3 (three) times daily.  90 tablet  11  . Multiple Vitamins-Minerals (CENTRUM SILVER PO) Take 1 tablet by mouth daily.       Vladimir Faster Glycol-Propyl Glycol (SYSTANE FREE OP) Place 1 drop into both eyes daily as needed.      . potassium chloride SA (K-DUR,KLOR-CON) 20 MEQ tablet Take 1 tablet (20 mEq total) by mouth daily.  90 tablet  3  . vitamin B-12 (CYANOCOBALAMIN) 1000 MCG tablet Take 500 mcg by mouth every other day.       . vitamin C (ASCORBIC ACID) 500 MG tablet Take 500 mg by mouth daily as needed (for cold).       . nitroGLYCERIN (NITROSTAT) 0.4 MG SL tablet Place 0.4 mg under the tongue every 5 (five) minutes as needed for chest pain.       No current facility-administered medications on file prior to visit.      Review of Systems  Constitutional: Negative for chills, activity change, appetite change, fatigue  and unexpected weight change.  HENT: Negative for congestion, mouth sores and sinus pressure.   Eyes: Negative for visual disturbance.  Respiratory: Negative for cough and chest tightness.   Gastrointestinal: Negative for nausea and abdominal pain.  Genitourinary: Negative for frequency, difficulty urinating and vaginal pain.  Musculoskeletal: Negative for back pain and gait problem.  Skin: Negative for pallor and rash.  Neurological: Negative for dizziness, tremors, weakness, numbness and headaches.  Psychiatric/Behavioral: Negative for confusion and sleep disturbance. The patient is not nervous/anxious.     BP 140/92  Temp(Src) 98.3 F (36.8 C) (Oral)  Wt 230 lb (104.327 kg)      Objective:   Physical Exam  Constitutional: She appears well-developed. No distress.  obese  HENT:  Head: Normocephalic.  Right Ear: External ear normal.  Left Ear: External ear normal.  Nose: Nose normal.  Mouth/Throat: Oropharynx is clear and  moist.  Eyes: Conjunctivae are normal. Pupils are equal, round, and reactive to light. Right eye exhibits no discharge. Left eye exhibits no discharge.  Neck: Normal range of motion. Neck supple. No JVD present. No tracheal deviation present. No thyromegaly present.  Cardiovascular: Normal rate, regular rhythm and normal heart sounds.   Pulmonary/Chest: No stridor. No respiratory distress. She has no wheezes.  Abdominal: Soft. Bowel sounds are normal. She exhibits no distension and no mass. There is no tenderness. There is no rebound and no guarding.  Musculoskeletal: She exhibits no edema and no tenderness.  Lymphadenopathy:    She has no cervical adenopathy.  Neurological: She displays normal reflexes. No cranial nerve deficit. She exhibits normal muscle tone. Coordination normal.  Skin: No rash noted. No erythema.  Psychiatric: She has a normal mood and affect. Her behavior is normal. Judgment and thought content normal.     Lab Results  Component Value Date   WBC 10.2 06/07/2014   HGB 12.5 06/07/2014   HCT 38.1 06/07/2014   PLT 245 06/07/2014   GLUCOSE 106* 06/07/2014   CHOL 155 06/02/2013   TRIG 174.0* 06/02/2013   HDL 28.70* 06/02/2013   LDLDIRECT 127.9 07/14/2011   LDLCALC 92 06/02/2013   ALT 22 06/07/2014   AST 19 06/07/2014   NA 142 06/07/2014   K 4.4 06/07/2014   CL 103 06/07/2014   CREATININE 1.37* 06/07/2014   BUN 25* 06/07/2014   CO2 24 06/07/2014   TSH 2.65 01/07/2011   INR 0.98 06/07/2014   HGBA1C 6.5 04/24/2014        Assessment & Plan:

## 2014-07-25 NOTE — Assessment & Plan Note (Signed)
Continue with current prescription therapy as reflected on the Med list.  

## 2014-07-25 NOTE — Assessment & Plan Note (Signed)
UA

## 2014-07-25 NOTE — Progress Notes (Signed)
Pre visit review using our clinic review tool, if applicable. No additional management support is needed unless otherwise documented below in the visit note. 

## 2014-07-25 NOTE — Assessment & Plan Note (Signed)
Wt Readings from Last 3 Encounters:  07/25/14 230 lb (104.327 kg)  06/07/14 235 lb (106.595 kg)  04/24/14 236 lb (107.049 kg)

## 2014-07-25 NOTE — Assessment & Plan Note (Signed)
CRF 2010 Dr Justin Mend

## 2014-10-30 ENCOUNTER — Telehealth: Payer: Self-pay | Admitting: Internal Medicine

## 2014-10-30 DIAGNOSIS — Z0100 Encounter for examination of eyes and vision without abnormal findings: Secondary | ICD-10-CM

## 2014-10-30 NOTE — Telephone Encounter (Signed)
Notified pt md has place referral will receive call once appt has been set-up...Bridget Oliver

## 2014-10-30 NOTE — Telephone Encounter (Signed)
Pt called in and wanted to see if Dr Camila Li could put in referral for her to see:   [facility] Address: [address], [place], [place] [address]  [phone]  Reason: Her regular routine fu

## 2014-10-30 NOTE — Telephone Encounter (Signed)
Ok Thx 

## 2014-11-26 ENCOUNTER — Ambulatory Visit (INDEPENDENT_AMBULATORY_CARE_PROVIDER_SITE_OTHER): Payer: Commercial Managed Care - HMO | Admitting: Internal Medicine

## 2014-11-26 ENCOUNTER — Other Ambulatory Visit (INDEPENDENT_AMBULATORY_CARE_PROVIDER_SITE_OTHER): Payer: Commercial Managed Care - HMO

## 2014-11-26 ENCOUNTER — Encounter: Payer: Self-pay | Admitting: Internal Medicine

## 2014-11-26 VITALS — BP 160/95 | HR 63 | Temp 97.5°F | Wt 235.0 lb

## 2014-11-26 DIAGNOSIS — Z86010 Personal history of colonic polyps: Secondary | ICD-10-CM | POA: Insufficient documentation

## 2014-11-26 DIAGNOSIS — I10 Essential (primary) hypertension: Secondary | ICD-10-CM

## 2014-11-26 DIAGNOSIS — E1121 Type 2 diabetes mellitus with diabetic nephropathy: Secondary | ICD-10-CM

## 2014-11-26 DIAGNOSIS — Z23 Encounter for immunization: Secondary | ICD-10-CM

## 2014-11-26 DIAGNOSIS — M1A30X0 Chronic gout due to renal impairment, unspecified site, without tophus (tophi): Secondary | ICD-10-CM

## 2014-11-26 DIAGNOSIS — E538 Deficiency of other specified B group vitamins: Secondary | ICD-10-CM

## 2014-11-26 LAB — BASIC METABOLIC PANEL
BUN: 22 mg/dL (ref 6–23)
CO2: 25 mEq/L (ref 19–32)
CREATININE: 1.4 mg/dL — AB (ref 0.4–1.2)
Calcium: 9.4 mg/dL (ref 8.4–10.5)
Chloride: 104 mEq/L (ref 96–112)
GFR: 47.07 mL/min — AB (ref >60.00)
GLUCOSE: 97 mg/dL (ref 70–99)
Potassium: 4.3 mEq/L (ref 3.5–5.1)
Sodium: 139 mEq/L (ref 135–145)

## 2014-11-26 LAB — HEPATIC FUNCTION PANEL
ALBUMIN: 3.7 g/dL (ref 3.5–5.2)
ALK PHOS: 82 U/L (ref 39–117)
ALT: 23 U/L (ref 0–35)
AST: 22 U/L (ref 0–37)
Bilirubin, Direct: 0.1 mg/dL (ref 0.0–0.3)
TOTAL PROTEIN: 7 g/dL (ref 6.0–8.3)
Total Bilirubin: 0.5 mg/dL (ref 0.2–1.2)

## 2014-11-26 LAB — TSH: TSH: 3.1 u[IU]/mL (ref 0.35–4.50)

## 2014-11-26 LAB — VITAMIN B12: Vitamin B-12: 990 pg/mL — ABNORMAL HIGH (ref 211–911)

## 2014-11-26 LAB — HEMOGLOBIN A1C: HEMOGLOBIN A1C: 6.4 % (ref 4.6–6.5)

## 2014-11-26 MED ORDER — HYDRALAZINE HCL 50 MG PO TABS: 50.0000 mg | ORAL_TABLET | Freq: Three times a day (TID) | ORAL | Status: DC

## 2014-11-26 NOTE — Assessment & Plan Note (Signed)
Worse Low carb diet

## 2014-11-26 NOTE — Assessment & Plan Note (Signed)
Chronic. 

## 2014-11-26 NOTE — Assessment & Plan Note (Signed)
On Uloric

## 2014-11-26 NOTE — Assessment & Plan Note (Signed)
Labs

## 2014-11-26 NOTE — Assessment & Plan Note (Signed)
Dr Fuller Plan Due colon in 2018

## 2014-11-26 NOTE — Progress Notes (Signed)
Pre visit review using our clinic review tool, if applicable. No additional management support is needed unless otherwise documented below in the visit note. 

## 2014-11-26 NOTE — Progress Notes (Signed)
Subjective:    HPI  F/u elevated BP at times - good BP at home per pt C/o LBP at times x 3 d The patient presents for a follow-up of  chronic hypertension, CRF, chronic dyslipidemia, type 2, gout diabetes controlled with medicines.   Off Metformin  Re-checked 160/95  BP Readings from Last 3 Encounters:  11/26/14 174/88  07/25/14 140/92  06/08/14 154/53   Wt Readings from Last 3 Encounters:  11/26/14 235 lb (106.595 kg)  07/25/14 230 lb (104.327 kg)  06/07/14 235 lb (106.595 kg)    Past Medical History  Diagnosis Date  . Hypertension   . Hyperlipidemia   . Hyperglycemia   . Swelling of lower limb   . Obesity   . CRI (chronic renal insufficiency)     Stage 3  . Lactose intolerance   . Diarrhea   . Gout   . Macular degeneration   . 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 Cesarean section  1969  . Abdominal hysterectomy  1985  . Lumbar laminectomy  2007  . Cataract extraction  2010 & 2011    bilateral  . Pars plana vitrectomy w/ repair of macular hole  2010 & 2011    Bilateral  . Appendectomy  1985    reports that she has never smoked. She has never used smokeless tobacco. She reports that she does not drink alcohol or use illicit drugs. family history includes Cancer in her other; Hypertension in her father and mother. No Known Allergies  Current Outpatient Prescriptions on File Prior to Visit  Medication Sig Dispense Refill  . acetaminophen (TYLENOL) 325 MG tablet Take 2 tablets (650 mg total) by mouth every 6 (six) hours as needed for moderate pain. 30 tablet 0  . atorvastatin (LIPITOR) 40 MG tablet Take 40 mg by mouth daily.    . carvedilol (COREG) 25 MG tablet Take 25 mg by mouth 2 (two) times daily with a meal.    . Febuxostat 80 MG TABS Take 80 mg by mouth daily.    . Fish Oil-Cholecalciferol (FISH OIL + D3) 1200-1000 MG-UNIT CAPS Take 1 tablet by mouth 2 (two) times daily.     .  furosemide (LASIX) 80 MG tablet Take 1 tablet (80 mg total) by mouth daily. 90 tablet 3  . hydrALAZINE (APRESOLINE) 25 MG tablet Take 1 tablet (25 mg total) by mouth 3 (three) times daily. 90 tablet 11  . Multiple Vitamins-Minerals (CENTRUM SILVER PO) Take 1 tablet by mouth daily.     . nitroGLYCERIN (NITROSTAT) 0.4 MG SL tablet Place 0.4 mg under the tongue every 5 (five) minutes as needed for chest pain.    Vladimir Faster Glycol-Propyl Glycol (SYSTANE FREE OP) Place 1 drop into both eyes daily as needed.    . potassium chloride SA (K-DUR,KLOR-CON) 20 MEQ tablet Take 1 tablet (20 mEq total) by mouth daily. 90 tablet 3  . vitamin B-12 (CYANOCOBALAMIN) 1000 MCG tablet Take 500 mcg by mouth every other day.     . vitamin C (ASCORBIC ACID) 500 MG tablet Take 500 mg by mouth daily as needed (for cold).      No current facility-administered medications on file prior to visit.      Review of Systems  Constitutional: Negative for chills, activity change, appetite change, fatigue and unexpected weight change.  HENT: Negative for congestion, mouth sores and sinus pressure.   Eyes: Negative for  visual disturbance.  Respiratory: Negative for cough and chest tightness.   Gastrointestinal: Negative for nausea and abdominal pain.  Genitourinary: Negative for frequency, difficulty urinating and vaginal pain.  Musculoskeletal: Negative for back pain and gait problem.  Skin: Negative for pallor and rash.  Neurological: Negative for dizziness, tremors, weakness, numbness and headaches.  Psychiatric/Behavioral: Negative for confusion and sleep disturbance. The patient is not nervous/anxious.     BP 174/88 mmHg  Pulse 63  Temp(Src) 97.5 F (36.4 C) (Oral)  Wt 235 lb (106.595 kg)  SpO2 94%      Objective:   Physical Exam  Constitutional: She appears well-developed. No distress.  HENT:  Head: Normocephalic.  Right Ear: External ear normal.  Left Ear: External ear normal.  Nose: Nose normal.   Mouth/Throat: Oropharynx is clear and moist.  Eyes: Conjunctivae are normal. Pupils are equal, round, and reactive to light. Right eye exhibits no discharge. Left eye exhibits no discharge.  Neck: Normal range of motion. Neck supple. No JVD present. No tracheal deviation present. No thyromegaly present.  Cardiovascular: Normal rate, regular rhythm and normal heart sounds.   Pulmonary/Chest: No stridor. No respiratory distress. She has no wheezes.  Abdominal: Soft. Bowel sounds are normal. She exhibits no distension and no mass. There is no tenderness. There is no rebound and no guarding.  Musculoskeletal: She exhibits no edema or tenderness.  Lymphadenopathy:    She has no cervical adenopathy.  Neurological: She displays normal reflexes. No cranial nerve deficit. She exhibits normal muscle tone. Coordination normal.  Skin: No rash noted. No erythema.  Psychiatric: She has a normal mood and affect. Her behavior is normal. Judgment and thought content normal.  Obese   Lab Results  Component Value Date   WBC 10.2 06/07/2014   HGB 12.5 06/07/2014   HCT 38.1 06/07/2014   PLT 245 06/07/2014   GLUCOSE 106* 06/07/2014   CHOL 155 06/02/2013   TRIG 174.0* 06/02/2013   HDL 28.70* 06/02/2013   LDLDIRECT 127.9 07/14/2011   LDLCALC 92 06/02/2013   ALT 22 06/07/2014   AST 19 06/07/2014   NA 142 06/07/2014   K 4.4 06/07/2014   CL 103 06/07/2014   CREATININE 1.37* 06/07/2014   BUN 25* 06/07/2014   CO2 24 06/07/2014   TSH 2.65 01/07/2011   INR 0.98 06/07/2014   HGBA1C 6.5 04/24/2014        Assessment & Plan:

## 2014-11-26 NOTE — Assessment & Plan Note (Addendum)
Hydralazine dose was increased  BP Readings from Last 3 Encounters:  11/26/14 160/95  07/25/14 140/92  06/08/14 154/53

## 2015-02-11 ENCOUNTER — Other Ambulatory Visit: Payer: Self-pay | Admitting: Internal Medicine

## 2015-02-15 ENCOUNTER — Other Ambulatory Visit (HOSPITAL_COMMUNITY): Payer: Self-pay | Admitting: *Deleted

## 2015-02-15 DIAGNOSIS — I6523 Occlusion and stenosis of bilateral carotid arteries: Secondary | ICD-10-CM

## 2015-03-14 ENCOUNTER — Ambulatory Visit (HOSPITAL_COMMUNITY): Payer: Commercial Managed Care - HMO | Attending: Cardiology | Admitting: Cardiology

## 2015-03-14 DIAGNOSIS — E119 Type 2 diabetes mellitus without complications: Secondary | ICD-10-CM | POA: Diagnosis not present

## 2015-03-14 DIAGNOSIS — I10 Essential (primary) hypertension: Secondary | ICD-10-CM | POA: Diagnosis not present

## 2015-03-14 DIAGNOSIS — I6523 Occlusion and stenosis of bilateral carotid arteries: Secondary | ICD-10-CM

## 2015-03-14 DIAGNOSIS — R0989 Other specified symptoms and signs involving the circulatory and respiratory systems: Secondary | ICD-10-CM | POA: Diagnosis present

## 2015-03-14 DIAGNOSIS — E785 Hyperlipidemia, unspecified: Secondary | ICD-10-CM | POA: Insufficient documentation

## 2015-03-14 NOTE — Progress Notes (Signed)
Carotid duplex performed 

## 2015-03-27 ENCOUNTER — Ambulatory Visit: Payer: Commercial Managed Care - HMO | Admitting: Internal Medicine

## 2015-03-28 ENCOUNTER — Ambulatory Visit: Payer: Commercial Managed Care - HMO | Admitting: Internal Medicine

## 2015-03-28 DIAGNOSIS — Z0289 Encounter for other administrative examinations: Secondary | ICD-10-CM

## 2015-04-15 ENCOUNTER — Encounter: Payer: Self-pay | Admitting: Internal Medicine

## 2015-04-15 ENCOUNTER — Ambulatory Visit (INDEPENDENT_AMBULATORY_CARE_PROVIDER_SITE_OTHER): Payer: Commercial Managed Care - HMO | Admitting: Internal Medicine

## 2015-04-15 ENCOUNTER — Other Ambulatory Visit (INDEPENDENT_AMBULATORY_CARE_PROVIDER_SITE_OTHER): Payer: Commercial Managed Care - HMO

## 2015-04-15 VITALS — BP 140/68 | HR 68 | Wt 234.0 lb

## 2015-04-15 DIAGNOSIS — N184 Chronic kidney disease, stage 4 (severe): Secondary | ICD-10-CM

## 2015-04-15 DIAGNOSIS — N181 Chronic kidney disease, stage 1: Secondary | ICD-10-CM

## 2015-04-15 DIAGNOSIS — N183 Chronic kidney disease, stage 3 (moderate): Secondary | ICD-10-CM | POA: Diagnosis not present

## 2015-04-15 DIAGNOSIS — N189 Chronic kidney disease, unspecified: Secondary | ICD-10-CM | POA: Diagnosis not present

## 2015-04-15 DIAGNOSIS — E1121 Type 2 diabetes mellitus with diabetic nephropathy: Secondary | ICD-10-CM | POA: Diagnosis not present

## 2015-04-15 DIAGNOSIS — I7771 Dissection of carotid artery: Secondary | ICD-10-CM | POA: Diagnosis not present

## 2015-04-15 DIAGNOSIS — N182 Chronic kidney disease, stage 2 (mild): Secondary | ICD-10-CM

## 2015-04-15 DIAGNOSIS — N185 Chronic kidney disease, stage 5: Secondary | ICD-10-CM | POA: Diagnosis not present

## 2015-04-15 DIAGNOSIS — E538 Deficiency of other specified B group vitamins: Secondary | ICD-10-CM

## 2015-04-15 DIAGNOSIS — N289 Disorder of kidney and ureter, unspecified: Secondary | ICD-10-CM

## 2015-04-15 DIAGNOSIS — I129 Hypertensive chronic kidney disease with stage 1 through stage 4 chronic kidney disease, or unspecified chronic kidney disease: Secondary | ICD-10-CM | POA: Diagnosis not present

## 2015-04-15 LAB — BASIC METABOLIC PANEL
BUN: 28 mg/dL — ABNORMAL HIGH (ref 6–23)
CHLORIDE: 105 meq/L (ref 96–112)
CO2: 24 mEq/L (ref 19–32)
Calcium: 9.1 mg/dL (ref 8.4–10.5)
Creatinine, Ser: 1.43 mg/dL — ABNORMAL HIGH (ref 0.40–1.20)
GFR: 46.27 mL/min — ABNORMAL LOW (ref >60.00)
Glucose, Bld: 106 mg/dL — ABNORMAL HIGH (ref 70–99)
POTASSIUM: 3.8 meq/L (ref 3.5–5.1)
Sodium: 138 mEq/L (ref 135–145)

## 2015-04-15 LAB — HEMOGLOBIN A1C: Hgb A1c MFr Bld: 6.1 % (ref 4.6–6.5)

## 2015-04-15 LAB — TSH: TSH: 3.69 u[IU]/mL (ref 0.35–4.50)

## 2015-04-15 MED ORDER — HYDRALAZINE HCL 25 MG PO TABS: 25.0000 mg | ORAL_TABLET | Freq: Three times a day (TID) | ORAL | Status: DC

## 2015-04-15 NOTE — Assessment & Plan Note (Signed)
On Lipitor 5/16 Not on ASA - start baby ASA

## 2015-04-15 NOTE — Progress Notes (Signed)
Pre visit review using our clinic review tool, if applicable. No additional management support is needed unless otherwise documented below in the visit note. 

## 2015-04-15 NOTE — Assessment & Plan Note (Signed)
On B12 

## 2015-04-15 NOTE — Assessment & Plan Note (Signed)
5/16 Pt has not started a 50 mg dose of Hydralazine yet - - re-start

## 2015-04-15 NOTE — Progress Notes (Signed)
Subjective:    HPI  F/u elevated BP at times - good BP at home per pt. Pt has not started a 50 mg dose of Hydralazine yet F/u LBP at times - doing ok The patient presents for a follow-up of  chronic hypertension, CRF, chronic dyslipidemia, type 2, gout diabetes controlled with medicines.   Off Metformin  Re-checked 160/95  BP Readings from Last 3 Encounters:  04/15/15 140/68  11/26/14 160/95  07/25/14 140/92   Wt Readings from Last 3 Encounters:  04/15/15 234 lb (106.142 kg)  11/26/14 235 lb (106.595 kg)  07/25/14 230 lb (104.327 kg)    Past Medical History  Diagnosis Date  . Hypertension   . Hyperlipidemia   . Hyperglycemia   . Swelling of lower limb   . Obesity   . CRI (chronic renal insufficiency)     Stage 3  . Lactose intolerance   . Diarrhea   . Gout   . Macular degeneration   . 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 Cesarean section  1969  . Abdominal hysterectomy  1985  . Lumbar laminectomy  2007  . Cataract extraction  2010 & 2011    bilateral  . Pars plana vitrectomy w/ repair of macular hole  2010 & 2011    Bilateral  . Appendectomy  1985    reports that she has never smoked. She has never used smokeless tobacco. She reports that she does not drink alcohol or use illicit drugs. family history includes Cancer in her other; Hypertension in her father and mother. No Known Allergies  Current Outpatient Prescriptions on File Prior to Visit  Medication Sig Dispense Refill  . acetaminophen (TYLENOL) 325 MG tablet Take 2 tablets (650 mg total) by mouth every 6 (six) hours as needed for moderate pain. 30 tablet 0  . atorvastatin (LIPITOR) 40 MG tablet TAKE 1 TABLET EVERY DAY 90 tablet 3  . carvedilol (COREG) 25 MG tablet TAKE 1 TABLET TWICE DAILY WITH A MEAL 180 tablet 3  . Fish Oil-Cholecalciferol (FISH OIL + D3) 1200-1000 MG-UNIT CAPS Take 1 tablet by mouth 2 (two) times daily.     .  furosemide (LASIX) 80 MG tablet TAKE 1 TABLET EVERY DAY 90 tablet 3  . hydrALAZINE (APRESOLINE) 50 MG tablet Take 1 tablet (50 mg total) by mouth 3 (three) times daily. 90 tablet 11  . Multiple Vitamins-Minerals (CENTRUM SILVER PO) Take 1 tablet by mouth daily.     . nitroGLYCERIN (NITROSTAT) 0.4 MG SL tablet Place 0.4 mg under the tongue every 5 (five) minutes as needed for chest pain.    Vladimir Faster Glycol-Propyl Glycol (SYSTANE FREE OP) Place 1 drop into both eyes daily as needed.    . potassium chloride SA (K-DUR,KLOR-CON) 20 MEQ tablet TAKE 1 TABLET EVERY DAY 90 tablet 3  . ULORIC 80 MG TABS TAKE 1 TABLET EVERY DAY 90 tablet 3  . vitamin B-12 (CYANOCOBALAMIN) 1000 MCG tablet Take 500 mcg by mouth every other day.     . vitamin C (ASCORBIC ACID) 500 MG tablet Take 500 mg by mouth daily as needed (for cold).      No current facility-administered medications on file prior to visit.      Review of Systems  Constitutional: Negative for chills, activity change, appetite change, fatigue and unexpected weight change.  HENT: Negative for congestion, mouth sores and sinus pressure.   Eyes: Negative for  visual disturbance.  Respiratory: Negative for cough and chest tightness.   Gastrointestinal: Negative for nausea and abdominal pain.  Genitourinary: Negative for frequency, difficulty urinating and vaginal pain.  Musculoskeletal: Negative for back pain and gait problem.  Skin: Negative for pallor and rash.  Neurological: Negative for dizziness, tremors, weakness, numbness and headaches.  Psychiatric/Behavioral: Negative for confusion and sleep disturbance. The patient is not nervous/anxious.     BP 140/68 mmHg  Pulse 68  Wt 234 lb (106.142 kg)  SpO2 95%      Objective:   Physical Exam  Constitutional: She appears well-developed. No distress.  HENT:  Head: Normocephalic.  Right Ear: External ear normal.  Left Ear: External ear normal.  Nose: Nose normal.  Mouth/Throat: Oropharynx  is clear and moist.  Eyes: Conjunctivae are normal. Pupils are equal, round, and reactive to light. Right eye exhibits no discharge. Left eye exhibits no discharge.  Neck: Normal range of motion. Neck supple. No JVD present. No tracheal deviation present. No thyromegaly present.  Cardiovascular: Normal rate, regular rhythm and normal heart sounds.   Pulmonary/Chest: No stridor. No respiratory distress. She has no wheezes.  Abdominal: Soft. Bowel sounds are normal. She exhibits no distension and no mass. There is no tenderness. There is no rebound and no guarding.  Musculoskeletal: She exhibits no edema or tenderness.  Lymphadenopathy:    She has no cervical adenopathy.  Neurological: She displays normal reflexes. No cranial nerve deficit. She exhibits normal muscle tone. Coordination normal.  Skin: No rash noted. No erythema.  Psychiatric: She has a normal mood and affect. Her behavior is normal. Judgment and thought content normal.  Obese   Lab Results  Component Value Date   WBC 10.2 06/07/2014   HGB 12.5 06/07/2014   HCT 38.1 06/07/2014   PLT 245 06/07/2014   GLUCOSE 97 11/26/2014   CHOL 155 06/02/2013   TRIG 174.0* 06/02/2013   HDL 28.70* 06/02/2013   LDLDIRECT 127.9 07/14/2011   LDLCALC 92 06/02/2013   ALT 23 11/26/2014   AST 22 11/26/2014   NA 139 11/26/2014   K 4.3 11/26/2014   CL 104 11/26/2014   CREATININE 1.4* 11/26/2014   BUN 22 11/26/2014   CO2 25 11/26/2014   TSH 3.10 11/26/2014   INR 0.98 06/07/2014   HGBA1C 6.4 11/26/2014        Assessment & Plan:

## 2015-04-15 NOTE — Assessment & Plan Note (Signed)
CRF 2010 Dr Justin Mend - due to DM, HTN

## 2015-04-15 NOTE — Assessment & Plan Note (Signed)
5/16 on diet

## 2015-05-10 ENCOUNTER — Encounter: Payer: Self-pay | Admitting: Internal Medicine

## 2015-05-22 ENCOUNTER — Telehealth: Payer: Self-pay | Admitting: Internal Medicine

## 2015-05-22 NOTE — Telephone Encounter (Signed)
error 

## 2015-08-16 ENCOUNTER — Encounter: Payer: Self-pay | Admitting: Internal Medicine

## 2015-08-16 ENCOUNTER — Ambulatory Visit (INDEPENDENT_AMBULATORY_CARE_PROVIDER_SITE_OTHER): Payer: Commercial Managed Care - HMO | Admitting: Internal Medicine

## 2015-08-16 VITALS — BP 130/58 | HR 65 | Wt 233.0 lb

## 2015-08-16 DIAGNOSIS — N181 Chronic kidney disease, stage 1: Secondary | ICD-10-CM

## 2015-08-16 DIAGNOSIS — N185 Chronic kidney disease, stage 5: Secondary | ICD-10-CM

## 2015-08-16 DIAGNOSIS — I129 Hypertensive chronic kidney disease with stage 1 through stage 4 chronic kidney disease, or unspecified chronic kidney disease: Secondary | ICD-10-CM

## 2015-08-16 DIAGNOSIS — E538 Deficiency of other specified B group vitamins: Secondary | ICD-10-CM

## 2015-08-16 DIAGNOSIS — M1000 Idiopathic gout, unspecified site: Secondary | ICD-10-CM

## 2015-08-16 DIAGNOSIS — N184 Chronic kidney disease, stage 4 (severe): Secondary | ICD-10-CM

## 2015-08-16 DIAGNOSIS — N183 Chronic kidney disease, stage 3 (moderate): Secondary | ICD-10-CM

## 2015-08-16 DIAGNOSIS — E1122 Type 2 diabetes mellitus with diabetic chronic kidney disease: Secondary | ICD-10-CM | POA: Diagnosis not present

## 2015-08-16 DIAGNOSIS — N182 Chronic kidney disease, stage 2 (mild): Secondary | ICD-10-CM

## 2015-08-16 DIAGNOSIS — Z23 Encounter for immunization: Secondary | ICD-10-CM | POA: Diagnosis not present

## 2015-08-16 DIAGNOSIS — N189 Chronic kidney disease, unspecified: Secondary | ICD-10-CM

## 2015-08-16 DIAGNOSIS — E785 Hyperlipidemia, unspecified: Secondary | ICD-10-CM

## 2015-08-16 MED ORDER — VITAMIN D3 50 MCG (2000 UT) PO CAPS: 2000.0000 [IU] | ORAL_CAPSULE | Freq: Every day | ORAL | Status: AC

## 2015-08-16 NOTE — Assessment & Plan Note (Signed)
  On diet  

## 2015-08-16 NOTE — Assessment & Plan Note (Signed)
Hydralazine, Coreg, Furosemide Labs

## 2015-08-16 NOTE — Assessment & Plan Note (Signed)
On B12 

## 2015-08-16 NOTE — Assessment & Plan Note (Signed)
On Lipitor Labs 

## 2015-08-16 NOTE — Assessment & Plan Note (Signed)
[facility] 

## 2015-08-16 NOTE — Progress Notes (Signed)
Pre visit review using our clinic review tool, if applicable. No additional management support is needed unless otherwise documented below in the visit note. 

## 2015-08-16 NOTE — Progress Notes (Signed)
Subjective:  Patient ID: Bridget Oliver, female    DOB: [birth year]  Age: 74 y.o. MRN: [id number]  CC: No chief complaint on file.   HPI Bridget Oliver presents for HTN, CRF, dyslipidemia f/u  Outpatient Prescriptions Prior to Visit  Medication Sig Dispense Refill  . acetaminophen (TYLENOL) 325 MG tablet Take 2 tablets (650 mg total) by mouth every 6 (six) hours as needed for moderate pain. 30 tablet 0  . atorvastatin (LIPITOR) 40 MG tablet TAKE 1 TABLET EVERY DAY 90 tablet 3  . carvedilol (COREG) 25 MG tablet TAKE 1 TABLET TWICE DAILY WITH A MEAL 180 tablet 3  . Fish Oil-Cholecalciferol (FISH OIL + D3) 1200-1000 MG-UNIT CAPS Take 1 tablet by mouth 2 (two) times daily.     . furosemide (LASIX) 80 MG tablet TAKE 1 TABLET EVERY DAY 90 tablet 3  . hydrALAZINE (APRESOLINE) 25 MG tablet Take 1 tablet (25 mg total) by mouth 3 (three) times daily. 270 tablet 3  . Multiple Vitamins-Minerals (CENTRUM SILVER PO) Take 1 tablet by mouth daily.     . nitroGLYCERIN (NITROSTAT) 0.4 MG SL tablet Place 0.4 mg under the tongue every 5 (five) minutes as needed for chest pain.    Vladimir Faster Glycol-Propyl Glycol (SYSTANE FREE OP) Place 1 drop into both eyes daily as needed.    . potassium chloride SA (K-DUR,KLOR-CON) 20 MEQ tablet TAKE 1 TABLET EVERY DAY 90 tablet 3  . ULORIC 80 MG TABS TAKE 1 TABLET EVERY DAY 90 tablet 3  . vitamin B-12 (CYANOCOBALAMIN) 1000 MCG tablet Take 500 mcg by mouth every other day.     . vitamin C (ASCORBIC ACID) 500 MG tablet Take 500 mg by mouth daily as needed (for cold).     . hydrALAZINE (APRESOLINE) 50 MG tablet Take 1 tablet (50 mg total) by mouth 3 (three) times daily. 90 tablet 11   No facility-administered medications prior to visit.    ROS Review of Systems  Constitutional: Negative for chills, activity change, appetite change, fatigue and unexpected weight change.  HENT: Negative for congestion, mouth sores and sinus pressure.   Eyes: Negative for visual disturbance.    Respiratory: Negative for cough and chest tightness.   Gastrointestinal: Negative for nausea and abdominal pain.  Genitourinary: Negative for frequency, difficulty urinating and vaginal pain.  Musculoskeletal: Negative for back pain and gait problem.  Skin: Negative for pallor and rash.  Neurological: Negative for dizziness, tremors, weakness, numbness and headaches.  Psychiatric/Behavioral: Negative for confusion and sleep disturbance.    Objective:  BP 130/58 mmHg  Pulse 65  Wt 233 lb (105.688 kg)  SpO2 95%  BP Readings from Last 3 Encounters:  08/16/15 130/58  04/15/15 140/68  11/26/14 160/95    Wt Readings from Last 3 Encounters:  08/16/15 233 lb (105.688 kg)  04/15/15 234 lb (106.142 kg)  11/26/14 235 lb (106.595 kg)    Physical Exam  Constitutional: She appears well-developed. No distress.  HENT:  Head: Normocephalic.  Right Ear: External ear normal.  Left Ear: External ear normal.  Nose: Nose normal.  Mouth/Throat: Oropharynx is clear and moist.  Eyes: Conjunctivae are normal. Pupils are equal, round, and reactive to light. Right eye exhibits no discharge. Left eye exhibits no discharge.  Neck: Normal range of motion. Neck supple. No JVD present. No tracheal deviation present. No thyromegaly present.  Cardiovascular: Normal rate, regular rhythm and normal heart sounds.   Pulmonary/Chest: No stridor. No respiratory distress. She has no wheezes.  Abdominal: Soft. Bowel sounds are normal. She exhibits no distension and no mass. There is no tenderness. There is no rebound and no guarding.  Musculoskeletal: She exhibits no edema or tenderness.  Lymphadenopathy:    She has no cervical adenopathy.  Neurological: She displays normal reflexes. No cranial nerve deficit. She exhibits normal muscle tone. Coordination normal.  Skin: No rash noted. No erythema.  Psychiatric: She has a normal mood and affect. Her behavior is normal. Judgment and thought content normal.   Obese  Lab Results  Component Value Date   WBC 10.2 06/07/2014   HGB 12.5 06/07/2014   HCT 38.1 06/07/2014   PLT 245 06/07/2014   GLUCOSE 106* 04/15/2015   CHOL 155 06/02/2013   TRIG 174.0* 06/02/2013   HDL 28.70* 06/02/2013   LDLDIRECT 127.9 07/14/2011   LDLCALC 92 06/02/2013   ALT 23 11/26/2014   AST 22 11/26/2014   NA 138 04/15/2015   K 3.8 04/15/2015   CL 105 04/15/2015   CREATININE 1.43* 04/15/2015   BUN 28* 04/15/2015   CO2 24 04/15/2015   TSH 3.69 04/15/2015   INR 0.98 06/07/2014   HGBA1C 6.1 04/15/2015    Dg Chest 2 View  06/07/2014   CLINICAL DATA:  Left-sided chest pain without shortness of breath  EXAM: CHEST  2 VIEW  COMPARISON:  PA and lateral chest x-ray of May 28, 2011  FINDINGS: The lungs are adequately inflated. There is no focal infiltrate. The cardiac silhouette is enlarged. The central pulmonary vascularity is prominent. There is no pleural effusion or pneumothorax. The bony thorax is unremarkable.  IMPRESSION: Mild pulmonary vascular prominence with cardiomegaly is consistent with low-grade CHF. There is no pulmonary edema or pneumonia.   Electronically Signed   By: David  Martinique   On: 06/07/2014 15:12   Ct Angio Chest Pe W/cm &/or Wo Cm  06/08/2014   CLINICAL DATA:  Left lateral chest pain.  Shortness of breath.  EXAM: CT ANGIOGRAPHY CHEST WITH CONTRAST  TECHNIQUE: Multidetector CT imaging of the chest was performed using the standard protocol during bolus administration of intravenous contrast. Multiplanar CT image reconstructions and MIPs were obtained to evaluate the vascular anatomy.  CONTRAST:  64mL OMNIPAQUE IOHEXOL 350 MG/ML SOLN  COMPARISON:  06/07/2014  FINDINGS: Body habitus reduces diagnostic sensitivity and specificity. Despite efforts by the technologist and patient, motion artifact is present on today's exam and could not be eliminated. This reduces exam sensitivity and specificity.  No filling defect is identified in the pulmonary arterial tree to  suggest pulmonary embolus. 10 mm left AP window lymph node. Mild cardiomegaly. The lungs appear clear.  Mild thoracic spondylosis.  Review of the MIP images confirms the above findings.  IMPRESSION: 1. No embolus identified. Reduced negative predictive value due to body habitus and breathing motion artifact. 2. Borderline enlarged AP window lymph node, significance uncertain. 3. Mild thoracic spondylosis. 4. Mild cardiomegaly.   Electronically Signed   By: Sherryl Barters M.D.   On: 06/08/2014 18:43   Nm Myocar Multi W/spect W/wall Motion / Ef  06/08/2014   CLINICAL DATA:  74 year old with current history of hypertension, diabetes and hyperlipidemia, presenting with chest pain.  EXAM: MYOCARDIAL IMAGING WITH SPECT (REST AND PHARMACOLOGIC-STRESS)  GATED LEFT VENTRICULAR WALL MOTION STUDY  LEFT VENTRICULAR EJECTION FRACTION  TECHNIQUE: Standard myocardial SPECT imaging was performed after resting intravenous injection of 10 mCi Tc-37m sestamibi. Subsequently, intravenous infusion of Lexiscan was performed under the supervision of the Cardiology staff. At peak effect of the  drug, 30 mCi Tc-72m sestamibi was injected intravenously and standard myocardial SPECT imaging was performed. Quantitative gated imaging was also performed to evaluate left ventricular wall motion, and estimate left ventricular ejection fraction.  COMPARISON:  None.  FINDINGS: Immediate post regadenoson images demonstrate no significant focal perfusion defects. Initial resting images demonstrate similar findings. No evidence of reversibility to suggest ischemia. Findings confirmed by the computer generated polar map.  Gated images demonstrate satisfactory thickening throughout the left ventricular myocardium with normal wall motion throughout.  Estimated QGS left ventricular ejection fraction measured 81%, with an end-diastolic volume of 63 ml and an end systolic volume of 12 ml.  IMPRESSION: 1. No evidence of myocardial ischemia or infarction.  2. Normal left ventricular wall motion. 3. Estimated QGS ejection fraction overestimated at 81%, possibly related to left ventricular hypertrophy.   Electronically Signed   By: Evangeline Dakin M.D.   On: 06/08/2014 14:37    Assessment & Plan:   Diagnoses and all orders for this visit:  Need for influenza vaccination -     Flu Vaccine QUAD 36+ mos IM   I am having Ms. Mineer maintain her vitamin B-12, Multiple Vitamins-Minerals (CENTRUM SILVER PO), vitamin C, FISH OIL + D3, Polyethyl Glycol-Propyl Glycol (SYSTANE FREE OP), nitroGLYCERIN, acetaminophen, furosemide, ULORIC, potassium chloride SA, atorvastatin, carvedilol, and hydrALAZINE.  No orders of the defined types were placed in this encounter.     Follow-up: No Follow-up on file.  Walker Kehr, MD

## 2015-12-16 ENCOUNTER — Encounter: Payer: Commercial Managed Care - HMO | Admitting: Internal Medicine

## 2015-12-16 ENCOUNTER — Other Ambulatory Visit (INDEPENDENT_AMBULATORY_CARE_PROVIDER_SITE_OTHER): Payer: Commercial Managed Care - HMO

## 2015-12-16 DIAGNOSIS — E785 Hyperlipidemia, unspecified: Secondary | ICD-10-CM | POA: Diagnosis not present

## 2015-12-16 DIAGNOSIS — E1122 Type 2 diabetes mellitus with diabetic chronic kidney disease: Secondary | ICD-10-CM

## 2015-12-16 DIAGNOSIS — E538 Deficiency of other specified B group vitamins: Secondary | ICD-10-CM | POA: Diagnosis not present

## 2015-12-16 DIAGNOSIS — M1000 Idiopathic gout, unspecified site: Secondary | ICD-10-CM

## 2015-12-16 DIAGNOSIS — I129 Hypertensive chronic kidney disease with stage 1 through stage 4 chronic kidney disease, or unspecified chronic kidney disease: Secondary | ICD-10-CM | POA: Diagnosis not present

## 2015-12-16 LAB — URINALYSIS, ROUTINE W REFLEX MICROSCOPIC
BILIRUBIN URINE: NEGATIVE
HGB URINE DIPSTICK: NEGATIVE
Ketones, ur: NEGATIVE
NITRITE: NEGATIVE
Specific Gravity, Urine: >=1.03 — AB (ref 1.000–1.030)
TOTAL PROTEIN, URINE-UPE24: NEGATIVE
URINE GLUCOSE: NEGATIVE
Urobilinogen, UA: 0.2 (ref 0.0–1.0)
pH: 5 (ref 5.0–8.0)

## 2015-12-16 LAB — CBC WITH DIFFERENTIAL/PLATELET
BASOS ABS: 0 10*3/uL (ref 0.0–0.1)
Basophils Relative: 0.3 % (ref 0.0–3.0)
Eosinophils Absolute: 0.1 10*3/uL (ref 0.0–0.7)
Eosinophils Relative: 1.6 % (ref 0.0–5.0)
HEMATOCRIT: 41.8 % (ref 36.0–46.0)
Hemoglobin: 13.6 g/dL (ref 12.0–15.0)
LYMPHS PCT: 25.7 % (ref 12.0–46.0)
Lymphs Abs: 2.1 10*3/uL (ref 0.7–4.0)
MCHC: 32.5 g/dL (ref 30.0–36.0)
MCV: 81.8 fl (ref 78.0–100.0)
MONOS PCT: 10.9 % (ref 3.0–12.0)
Monocytes Absolute: 0.9 10*3/uL (ref 0.1–1.0)
NEUTROS ABS: 5 10*3/uL (ref 1.4–7.7)
Neutrophils Relative %: 61.5 % (ref 43.0–77.0)
Platelets: 260 10*3/uL (ref 150.0–400.0)
RBC: 5.1 Mil/uL (ref 3.87–5.11)
RDW: 14.6 % (ref 11.5–15.5)
WBC: 8.1 10*3/uL (ref 4.0–10.5)

## 2015-12-16 LAB — BASIC METABOLIC PANEL
BUN: 25 mg/dL — ABNORMAL HIGH (ref 6–23)
CHLORIDE: 105 meq/L (ref 96–112)
CO2: 26 mEq/L (ref 19–32)
Calcium: 9.3 mg/dL (ref 8.4–10.5)
Creatinine, Ser: 1.44 mg/dL — ABNORMAL HIGH (ref 0.40–1.20)
GFR: 45.81 mL/min — ABNORMAL LOW (ref >60.00)
Glucose, Bld: 110 mg/dL — ABNORMAL HIGH (ref 70–99)
POTASSIUM: 4 meq/L (ref 3.5–5.1)
SODIUM: 140 meq/L (ref 135–145)

## 2015-12-16 LAB — HEMOGLOBIN A1C: Hgb A1c MFr Bld: 6.2 % (ref 4.6–6.5)

## 2015-12-16 LAB — HEPATIC FUNCTION PANEL
ALK PHOS: 89 U/L (ref 39–117)
ALT: 20 U/L (ref 0–35)
AST: 19 U/L (ref 0–37)
Albumin: 3.7 g/dL (ref 3.5–5.2)
BILIRUBIN DIRECT: 0 mg/dL (ref 0.0–0.3)
TOTAL PROTEIN: 7.1 g/dL (ref 6.0–8.3)
Total Bilirubin: 0.5 mg/dL (ref 0.2–1.2)

## 2015-12-16 LAB — VITAMIN D 25 HYDROXY (VIT D DEFICIENCY, FRACTURES): VITD: 76.12 ng/mL (ref 30.00–100.00)

## 2015-12-16 LAB — LIPID PANEL
CHOL/HDL RATIO: 5
Cholesterol: 142 mg/dL (ref 0–200)
HDL: 30.8 mg/dL — ABNORMAL LOW (ref >39.00)
LDL Cholesterol: 86 mg/dL (ref 0–99)
NONHDL: 111.46
Triglycerides: 127 mg/dL (ref 0.0–149.0)
VLDL: 25.4 mg/dL (ref 0.0–40.0)

## 2015-12-16 LAB — TSH: TSH: 2.65 u[IU]/mL (ref 0.35–4.50)

## 2015-12-16 LAB — VITAMIN B12: Vitamin B-12: 1155 pg/mL — ABNORMAL HIGH (ref 211–911)

## 2015-12-19 ENCOUNTER — Ambulatory Visit (INDEPENDENT_AMBULATORY_CARE_PROVIDER_SITE_OTHER): Payer: Commercial Managed Care - HMO | Admitting: Internal Medicine

## 2015-12-19 ENCOUNTER — Encounter: Payer: Self-pay | Admitting: Internal Medicine

## 2015-12-19 VITALS — BP 139/82 | HR 69 | Ht 63.0 in | Wt 238.0 lb

## 2015-12-19 DIAGNOSIS — Z0000 Encounter for general adult medical examination without abnormal findings: Secondary | ICD-10-CM

## 2015-12-19 MED ORDER — POTASSIUM CHLORIDE CRYS ER 20 MEQ PO TBCR: 20.0000 meq | EXTENDED_RELEASE_TABLET | Freq: Every day | ORAL | Status: DC

## 2015-12-19 MED ORDER — ATORVASTATIN CALCIUM 40 MG PO TABS: 40.0000 mg | ORAL_TABLET | Freq: Every day | ORAL | Status: DC

## 2015-12-19 MED ORDER — CARVEDILOL 25 MG PO TABS: ORAL_TABLET | ORAL | Status: DC

## 2015-12-19 MED ORDER — FEBUXOSTAT 80 MG PO TABS: 1.0000 | ORAL_TABLET | Freq: Every day | ORAL | Status: DC

## 2015-12-19 MED ORDER — FUROSEMIDE 80 MG PO TABS: 80.0000 mg | ORAL_TABLET | Freq: Every day | ORAL | Status: DC

## 2015-12-19 NOTE — Progress Notes (Signed)
Subjective:  Patient ID: Bridget Oliver, female    DOB: 1940/12/17  Age: 75 y.o. MRN: [id number]  CC: No chief complaint on file.   HPI Bridget Oliver presents for a well exam  Outpatient Prescriptions Prior to Visit  Medication Sig Dispense Refill  . acetaminophen (TYLENOL) 325 MG tablet Take 2 tablets (650 mg total) by mouth every 6 (six) hours as needed for moderate pain. 30 tablet 0  . Cholecalciferol (VITAMIN D3) 2000 UNITS capsule Take 1 capsule (2,000 Units total) by mouth daily. 100 capsule 3  . Fish Oil-Cholecalciferol (FISH OIL + D3) 1200-1000 MG-UNIT CAPS Take 1 tablet by mouth 2 (two) times daily.     . hydrALAZINE (APRESOLINE) 25 MG tablet Take 1 tablet (25 mg total) by mouth 3 (three) times daily. 270 tablet 3  . Multiple Vitamins-Minerals (CENTRUM SILVER PO) Take 1 tablet by mouth daily.     . nitroGLYCERIN (NITROSTAT) 0.4 MG SL tablet Place 0.4 mg under the tongue every 5 (five) minutes as needed for chest pain.    Vladimir Faster Glycol-Propyl Glycol (SYSTANE FREE OP) Place 1 drop into both eyes daily as needed.    . vitamin B-12 (CYANOCOBALAMIN) 1000 MCG tablet Take 500 mcg by mouth every other day.     . vitamin C (ASCORBIC ACID) 500 MG tablet Take 500 mg by mouth daily as needed (for cold).     Marland Kitchen atorvastatin (LIPITOR) 40 MG tablet TAKE 1 TABLET EVERY DAY 90 tablet 3  . carvedilol (COREG) 25 MG tablet TAKE 1 TABLET TWICE DAILY WITH A MEAL 180 tablet 3  . furosemide (LASIX) 80 MG tablet TAKE 1 TABLET EVERY DAY 90 tablet 3  . potassium chloride SA (K-DUR,KLOR-CON) 20 MEQ tablet TAKE 1 TABLET EVERY DAY 90 tablet 3  . ULORIC 80 MG TABS TAKE 1 TABLET EVERY DAY 90 tablet 3   No facility-administered medications prior to visit.    ROS Review of Systems  Constitutional: Negative for chills, activity change, appetite change, fatigue and unexpected weight change.  HENT: Negative for congestion, mouth sores and sinus pressure.   Eyes: Negative for visual disturbance.  Respiratory:  Negative for cough and chest tightness.   Gastrointestinal: Negative for nausea and abdominal pain.  Genitourinary: Negative for frequency, difficulty urinating and vaginal pain.  Musculoskeletal: Negative for back pain and gait problem.  Skin: Negative for pallor and rash.  Neurological: Negative for dizziness, tremors, weakness, numbness and headaches.  Psychiatric/Behavioral: Negative for suicidal ideas, confusion, sleep disturbance and decreased concentration. The patient is not nervous/anxious.     Objective:  BP 154/66 mmHg  Pulse 69  Ht 5\' 3"  (1.6 m)  Wt 238 lb (107.956 kg)  BMI 42.17 kg/m2  SpO2 94%  BP Readings from Last 3 Encounters:  12/19/15 154/66  08/16/15 130/58  04/15/15 140/68    Wt Readings from Last 3 Encounters:  12/19/15 238 lb (107.956 kg)  08/16/15 233 lb (105.688 kg)  04/15/15 234 lb (106.142 kg)    Physical Exam  Constitutional: She appears well-developed. No distress.  HENT:  Head: Normocephalic.  Right Ear: External ear normal.  Left Ear: External ear normal.  Nose: Nose normal.  Mouth/Throat: Oropharynx is clear and moist.  Eyes: Conjunctivae are normal. Pupils are equal, round, and reactive to light. Right eye exhibits no discharge. Left eye exhibits no discharge.  Neck: Normal range of motion. Neck supple. No JVD present. No tracheal deviation present. No thyromegaly present.  Cardiovascular: Normal rate, regular rhythm and  normal heart sounds.   Pulmonary/Chest: No stridor. No respiratory distress. She has no wheezes.  Abdominal: Soft. Bowel sounds are normal. She exhibits no distension and no mass. There is no tenderness. There is no rebound and no guarding.  Musculoskeletal: She exhibits no edema or tenderness.  Lymphadenopathy:    She has no cervical adenopathy.  Neurological: She displays normal reflexes. No cranial nerve deficit. She exhibits normal muscle tone. Coordination normal.  Skin: No rash noted. No erythema.  Psychiatric: She  has a normal mood and affect. Her behavior is normal. Judgment and thought content normal.  Obese  Lab Results  Component Value Date   WBC 8.1 12/16/2015   HGB 13.6 12/16/2015   HCT 41.8 12/16/2015   PLT 260.0 12/16/2015   GLUCOSE 110* 12/16/2015   CHOL 142 12/16/2015   TRIG 127.0 12/16/2015   HDL 30.80* 12/16/2015   LDLDIRECT 127.9 07/14/2011   LDLCALC 86 12/16/2015   ALT 20 12/16/2015   AST 19 12/16/2015   NA 140 12/16/2015   K 4.0 12/16/2015   CL 105 12/16/2015   CREATININE 1.44* 12/16/2015   BUN 25* 12/16/2015   CO2 26 12/16/2015   TSH 2.65 12/16/2015   INR 0.98 06/07/2014   HGBA1C 6.2 12/16/2015    Dg Chest 2 View  06/07/2014  CLINICAL DATA:  Left-sided chest pain without shortness of breath EXAM: CHEST  2 VIEW COMPARISON:  PA and lateral chest x-ray of May 28, 2011 FINDINGS: The lungs are adequately inflated. There is no focal infiltrate. The cardiac silhouette is enlarged. The central pulmonary vascularity is prominent. There is no pleural effusion or pneumothorax. The bony thorax is unremarkable. IMPRESSION: Mild pulmonary vascular prominence with cardiomegaly is consistent with low-grade CHF. There is no pulmonary edema or pneumonia. Electronically Signed   By: David  Martinique   On: 06/07/2014 15:12   Ct Angio Chest Pe W/cm &/or Wo Cm  06/08/2014  CLINICAL DATA:  Left lateral chest pain.  Shortness of breath. EXAM: CT ANGIOGRAPHY CHEST WITH CONTRAST TECHNIQUE: Multidetector CT imaging of the chest was performed using the standard protocol during bolus administration of intravenous contrast. Multiplanar CT image reconstructions and MIPs were obtained to evaluate the vascular anatomy. CONTRAST:  65mL OMNIPAQUE IOHEXOL 350 MG/ML SOLN COMPARISON:  06/07/2014 FINDINGS: Body habitus reduces diagnostic sensitivity and specificity. Despite efforts by the technologist and patient, motion artifact is present on today's exam and could not be eliminated. This reduces exam sensitivity and  specificity. No filling defect is identified in the pulmonary arterial tree to suggest pulmonary embolus. 10 mm left AP window lymph node. Mild cardiomegaly. The lungs appear clear. Mild thoracic spondylosis. Review of the MIP images confirms the above findings. IMPRESSION: 1. No embolus identified. Reduced negative predictive value due to body habitus and breathing motion artifact. 2. Borderline enlarged AP window lymph node, significance uncertain. 3. Mild thoracic spondylosis. 4. Mild cardiomegaly. Electronically Signed   By: Sherryl Barters M.D.   On: 06/08/2014 18:43   Nm Myocar Multi W/spect W/wall Motion / Ef  06/08/2014  CLINICAL DATA:  75 year old with current history of hypertension, diabetes and hyperlipidemia, presenting with chest pain. EXAM: MYOCARDIAL IMAGING WITH SPECT (REST AND PHARMACOLOGIC-STRESS) GATED LEFT VENTRICULAR WALL MOTION STUDY LEFT VENTRICULAR EJECTION FRACTION TECHNIQUE: Standard myocardial SPECT imaging was performed after resting intravenous injection of 10 mCi Tc-30m sestamibi. Subsequently, intravenous infusion of Lexiscan was performed under the supervision of the Cardiology staff. At peak effect of the drug, 30 mCi Tc-82m sestamibi was injected intravenously  and standard myocardial SPECT imaging was performed. Quantitative gated imaging was also performed to evaluate left ventricular wall motion, and estimate left ventricular ejection fraction. COMPARISON:  None. FINDINGS: Immediate post regadenoson images demonstrate no significant focal perfusion defects. Initial resting images demonstrate similar findings. No evidence of reversibility to suggest ischemia. Findings confirmed by the computer generated polar map. Gated images demonstrate satisfactory thickening throughout the left ventricular myocardium with normal wall motion throughout. Estimated QGS left ventricular ejection fraction measured 81%, with an end-diastolic volume of 63 ml and an end systolic volume of 12 ml.  IMPRESSION: 1. No evidence of myocardial ischemia or infarction. 2. Normal left ventricular wall motion. 3. Estimated QGS ejection fraction overestimated at 81%, possibly related to left ventricular hypertrophy. Electronically Signed   By: Evangeline Dakin M.D.   On: 06/08/2014 14:37    Assessment & Plan:   There are no diagnoses linked to this encounter. I have changed Ms. Corenne Ishimoto to Febuxostat. I have also changed her atorvastatin, furosemide, and potassium chloride SA. I am also having her maintain her vitamin B-12, Multiple Vitamins-Minerals (CENTRUM SILVER PO), vitamin C, FISH OIL + D3, Polyethyl Glycol-Propyl Glycol (SYSTANE FREE OP), nitroGLYCERIN, acetaminophen, hydrALAZINE, Vitamin D3, and carvedilol.  Meds ordered this encounter  Medications  . carvedilol (COREG) 25 MG tablet    Sig: TAKE 1 TABLET TWICE DAILY WITH A MEAL    Dispense:  180 tablet    Refill:  3  . atorvastatin (LIPITOR) 40 MG tablet    Sig: Take 1 tablet (40 mg total) by mouth daily.    Dispense:  90 tablet    Refill:  3  . furosemide (LASIX) 80 MG tablet    Sig: Take 1 tablet (80 mg total) by mouth daily.    Dispense:  90 tablet    Refill:  3  . Febuxostat (ULORIC) 80 MG TABS    Sig: Take 1 tablet (80 mg total) by mouth daily.    Dispense:  90 tablet    Refill:  3  . potassium chloride SA (K-DUR,KLOR-CON) 20 MEQ tablet    Sig: Take 1 tablet (20 mEq total) by mouth daily.    Dispense:  90 tablet    Refill:  3     Follow-up: No Follow-up on file.  Walker Kehr, MD

## 2015-12-19 NOTE — Progress Notes (Signed)
Pre visit review using our clinic review tool, if applicable. No additional management support is needed unless otherwise documented below in the visit note. 

## 2015-12-19 NOTE — Assessment & Plan Note (Addendum)
Here for medicare wellness/physical  Diet: heart healthy  Physical activity: sedentary  Depression/mood screen: negative  Hearing: intact to whispered voice  Visual acuity: grossly normal, performs annual eye exam  ADLs: capable  Fall risk: low to none  Home safety: good  Cognitive evaluation: intact to orientation, naming, recall and repetition  EOL planning: adv directives, full code/ I agree  I have personally reviewed and have noted  1. The patient's medical, surgical and social history  2. Their use of alcohol, tobacco or illicit drugs  3. Their current medications and supplements  4. The patient's functional ability including ADL's, fall risks, home safety risks and hearing or visual impairment.  5. Diet and physical activities  6. Evidence for depression or mood disorders 7. The roster of all physicians providing medical care to patient - is listed in the Snapshot section of the chart and reviewed today.    Today patient counseled on age appropriate routine health concerns for screening and prevention, each reviewed and up to date or declined. Immunizations reviewed and up to date or declined. Labs ordered and reviewed. Risk factors for depression reviewed and negative. Hearing function and visual acuity are intact. ADLs screened and addressed as needed. Functional ability and level of safety reviewed and appropriate. Education, counseling and referrals performed based on assessed risks today. Patient provided with a copy of personalized plan for preventive services.   Colon due 2018 Dr Fuller Plan  Ophth, mammo q 12 mo  Labs

## 2015-12-19 NOTE — Patient Instructions (Signed)
Preventive Care for Adults, Female A healthy lifestyle and preventive care can promote health and wellness. Preventive health guidelines for women include the following key practices.  A routine yearly physical is a good way to check with your health care provider about your health and preventive screening. It is a chance to share any concerns and updates on your health and to receive a thorough exam.  Visit your dentist for a routine exam and preventive care every 6 months. Brush your teeth twice a day and floss once a day. Good oral hygiene prevents tooth decay and gum disease.  The frequency of eye exams is based on your age, health, family medical history, use of contact lenses, and other factors. Follow your health care provider's recommendations for frequency of eye exams.  Eat a healthy diet. Foods like vegetables, fruits, whole grains, low-fat dairy products, and lean protein foods contain the nutrients you need without too many calories. Decrease your intake of foods high in solid fats, added sugars, and salt. Eat the right amount of calories for you.Get information about a proper diet from your health care provider, if necessary.  Regular physical exercise is one of the most important things you can do for your health. Most adults should get at least 150 minutes of moderate-intensity exercise (any activity that increases your heart rate and causes you to sweat) each week. In addition, most adults need muscle-strengthening exercises on 2 or more days a week.  Maintain a healthy weight. The body mass index (BMI) is a screening tool to identify possible weight problems. It provides an estimate of body fat based on height and weight. Your health care provider can find your BMI and can help you achieve or maintain a healthy weight.For adults 20 years and older:  A BMI below 18.5 is considered underweight.  A BMI of 18.5 to 24.9 is normal.  A BMI of 25 to 29.9 is considered overweight.  A  BMI of 30 and above is considered obese.  Maintain normal blood lipids and cholesterol levels by exercising and minimizing your intake of saturated fat. Eat a balanced diet with plenty of fruit and vegetables. Blood tests for lipids and cholesterol should begin at age 45 and be repeated every 5 years. If your lipid or cholesterol levels are high, you are over 50, or you are at high risk for heart disease, you may need your cholesterol levels checked more frequently.Ongoing high lipid and cholesterol levels should be treated with medicines if diet and exercise are not working.  If you smoke, find out from your health care provider how to quit. If you do not use tobacco, do not start.  Lung cancer screening is recommended for adults aged 45-80 years who are at high risk for developing lung cancer because of a history of smoking. A yearly low-dose CT scan of the lungs is recommended for people who have at least a 30-pack-year history of smoking and are a current smoker or have quit within the past 15 years. A pack year of smoking is smoking an average of 1 pack of cigarettes a day for 1 year (for example: 1 pack a day for 30 years or 2 packs a day for 15 years). Yearly screening should continue until the smoker has stopped smoking for at least 15 years. Yearly screening should be stopped for people who develop a health problem that would prevent them from having lung cancer treatment.  If you are pregnant, do not drink alcohol. If you are  breastfeeding, be very cautious about drinking alcohol. If you are not pregnant and choose to drink alcohol, do not have more than 1 drink per day. One drink is considered to be 12 ounces (355 mL) of beer, 5 ounces (148 mL) of wine, or 1.5 ounces (44 mL) of liquor.  Avoid use of street drugs. Do not share needles with anyone. Ask for help if you need support or instructions about stopping the use of drugs.  High blood pressure causes heart disease and increases the risk  of stroke. Your blood pressure should be checked at least every 1 to 2 years. Ongoing high blood pressure should be treated with medicines if weight loss and exercise do not work.  If you are 55-79 years old, ask your health care provider if you should take aspirin to prevent strokes.  Diabetes screening is done by taking a blood sample to check your blood glucose level after you have not eaten for a certain period of time (fasting). If you are not overweight and you do not have risk factors for diabetes, you should be screened once every 3 years starting at age 45. If you are overweight or obese and you are 40-70 years of age, you should be screened for diabetes every year as part of your cardiovascular risk assessment.  Breast cancer screening is essential preventive care for women. You should practice "breast self-awareness." This means understanding the normal appearance and feel of your breasts and may include breast self-examination. Any changes detected, no matter how small, should be reported to a health care provider. Women in their 20s and 30s should have a clinical breast exam (CBE) by a health care provider as part of a regular health exam every 1 to 3 years. After age 40, women should have a CBE every year. Starting at age 40, women should consider having a mammogram (breast X-ray test) every year. Women who have a family history of breast cancer should talk to their health care provider about genetic screening. Women at a high risk of breast cancer should talk to their health care providers about having an MRI and a mammogram every year.  Breast cancer gene (BRCA)-related cancer risk assessment is recommended for women who have family members with BRCA-related cancers. BRCA-related cancers include breast, ovarian, tubal, and peritoneal cancers. Having family members with these cancers may be associated with an increased risk for harmful changes (mutations) in the breast cancer genes BRCA1 and  BRCA2. Results of the assessment will determine the need for genetic counseling and BRCA1 and BRCA2 testing.  Your health care provider may recommend that you be screened regularly for cancer of the pelvic organs (ovaries, uterus, and vagina). This screening involves a pelvic examination, including checking for microscopic changes to the surface of your cervix (Pap test). You may be encouraged to have this screening done every 3 years, beginning at age 21.  For women ages 30-65, health care providers may recommend pelvic exams and Pap testing every 3 years, or they may recommend the Pap and pelvic exam, combined with testing for human papilloma virus (HPV), every 5 years. Some types of HPV increase your risk of cervical cancer. Testing for HPV may also be done on women of any age with unclear Pap test results.  Other health care providers may not recommend any screening for nonpregnant women who are considered low risk for pelvic cancer and who do not have symptoms. Ask your health care provider if a screening pelvic exam is right for   you.  If you have had past treatment for cervical cancer or a condition that could lead to cancer, you need Pap tests and screening for cancer for at least 20 years after your treatment. If Pap tests have been discontinued, your risk factors (such as having a new sexual partner) need to be reassessed to determine if screening should resume. Some women have medical problems that increase the chance of getting cervical cancer. In these cases, your health care provider may recommend more frequent screening and Pap tests.  Colorectal cancer can be detected and often prevented. Most routine colorectal cancer screening begins at the age of 50 years and continues through age 75 years. However, your health care provider may recommend screening at an earlier age if you have risk factors for colon cancer. On a yearly basis, your health care provider may provide home test kits to check  for hidden blood in the stool. Use of a small camera at the end of a tube, to directly examine the colon (sigmoidoscopy or colonoscopy), can detect the earliest forms of colorectal cancer. Talk to your health care provider about this at age 50, when routine screening begins. Direct exam of the colon should be repeated every 5-10 years through age 75 years, unless early forms of precancerous polyps or small growths are found.  People who are at an increased risk for hepatitis B should be screened for this virus. You are considered at high risk for hepatitis B if:  You were born in a country where hepatitis B occurs often. Talk with your health care provider about which countries are considered high risk.  Your parents were born in a high-risk country and you have not received a shot to protect against hepatitis B (hepatitis B vaccine).  You have HIV or AIDS.  You use needles to inject street drugs.  You live with, or have sex with, someone who has hepatitis B.  You get hemodialysis treatment.  You take certain medicines for conditions like cancer, organ transplantation, and autoimmune conditions.  Hepatitis C blood testing is recommended for all people born from 1945 through 1965 and any individual with known risks for hepatitis C.  Practice safe sex. Use condoms and avoid high-risk sexual practices to reduce the spread of sexually transmitted infections (STIs). STIs include gonorrhea, chlamydia, syphilis, trichomonas, herpes, HPV, and human immunodeficiency virus (HIV). Herpes, HIV, and HPV are viral illnesses that have no cure. They can result in disability, cancer, and death.  You should be screened for sexually transmitted illnesses (STIs) including gonorrhea and chlamydia if:  You are sexually active and are younger than 24 years.  You are older than 24 years and your health care provider tells you that you are at risk for this type of infection.  Your sexual activity has changed  since you were last screened and you are at an increased risk for chlamydia or gonorrhea. Ask your health care provider if you are at risk.  If you are at risk of being infected with HIV, it is recommended that you take a prescription medicine daily to prevent HIV infection. This is called preexposure prophylaxis (PrEP). You are considered at risk if:  You are sexually active and do not regularly use condoms or know the HIV status of your partner(s).  You take drugs by injection.  You are sexually active with a partner who has HIV.  Talk with your health care provider about whether you are at high risk of being infected with HIV. If   you choose to begin PrEP, you should first be tested for HIV. You should then be tested every 3 months for as long as you are taking PrEP.  Osteoporosis is a disease in which the bones lose minerals and strength with aging. This can result in serious bone fractures or breaks. The risk of osteoporosis can be identified using a bone density scan. Women ages 67 years and over and women at risk for fractures or osteoporosis should discuss screening with their health care providers. Ask your health care provider whether you should take a calcium supplement or vitamin D to reduce the rate of osteoporosis.  Menopause can be associated with physical symptoms and risks. Hormone replacement therapy is available to decrease symptoms and risks. You should talk to your health care provider about whether hormone replacement therapy is right for you.  Use sunscreen. Apply sunscreen liberally and repeatedly throughout the day. You should seek shade when your shadow is shorter than you. Protect yourself by wearing long sleeves, pants, a wide-brimmed hat, and sunglasses year round, whenever you are outdoors.  Once a month, do a whole body skin exam, using a mirror to look at the skin on your back. Tell your health care provider of new moles, moles that have irregular borders, moles that  are larger than a pencil eraser, or moles that have changed in shape or color.  Stay current with required vaccines (immunizations).  Influenza vaccine. All adults should be immunized every year.  Tetanus, diphtheria, and acellular pertussis (Td, Tdap) vaccine. Pregnant women should receive 1 dose of Tdap vaccine during each pregnancy. The dose should be obtained regardless of the length of time since the last dose. Immunization is preferred during the 27th-36th week of gestation. An adult who has not previously received Tdap or who does not know her vaccine status should receive 1 dose of Tdap. This initial dose should be followed by tetanus and diphtheria toxoids (Td) booster doses every 10 years. Adults with an unknown or incomplete history of completing a 3-dose immunization series with Td-containing vaccines should begin or complete a primary immunization series including a Tdap dose. Adults should receive a Td booster every 10 years.  Varicella vaccine. An adult without evidence of immunity to varicella should receive 2 doses or a second dose if she has previously received 1 dose. Pregnant females who do not have evidence of immunity should receive the first dose after pregnancy. This first dose should be obtained before leaving the health care facility. The second dose should be obtained 4-8 weeks after the first dose.  Human papillomavirus (HPV) vaccine. Females aged 13-26 years who have not received the vaccine previously should obtain the 3-dose series. The vaccine is not recommended for use in pregnant females. However, pregnancy testing is not needed before receiving a dose. If a female is found to be pregnant after receiving a dose, no treatment is needed. In that case, the remaining doses should be delayed until after the pregnancy. Immunization is recommended for any person with an immunocompromised condition through the age of 61 years if she did not get any or all doses earlier. During the  3-dose series, the second dose should be obtained 4-8 weeks after the first dose. The third dose should be obtained 24 weeks after the first dose and 16 weeks after the second dose.  Zoster vaccine. One dose is recommended for adults aged 30 years or older unless certain conditions are present.  Measles, mumps, and rubella (MMR) vaccine. Adults born  before 1957 generally are considered immune to measles and mumps. Adults born in 1957 or later should have 1 or more doses of MMR vaccine unless there is a contraindication to the vaccine or there is laboratory evidence of immunity to each of the three diseases. A routine second dose of MMR vaccine should be obtained at least 28 days after the first dose for students attending postsecondary schools, health care workers, or international travelers. People who received inactivated measles vaccine or an unknown type of measles vaccine during 1963-1967 should receive 2 doses of MMR vaccine. People who received inactivated mumps vaccine or an unknown type of mumps vaccine before 1979 and are at high risk for mumps infection should consider immunization with 2 doses of MMR vaccine. For females of childbearing age, rubella immunity should be determined. If there is no evidence of immunity, females who are not pregnant should be vaccinated. If there is no evidence of immunity, females who are pregnant should delay immunization until after pregnancy. Unvaccinated health care workers born before 1957 who lack laboratory evidence of measles, mumps, or rubella immunity or laboratory confirmation of disease should consider measles and mumps immunization with 2 doses of MMR vaccine or rubella immunization with 1 dose of MMR vaccine.  Pneumococcal 13-valent conjugate (PCV13) vaccine. When indicated, a person who is uncertain of his immunization history and has no record of immunization should receive the PCV13 vaccine. All adults 65 years of age and older should receive this  vaccine. An adult aged 19 years or older who has certain medical conditions and has not been previously immunized should receive 1 dose of PCV13 vaccine. This PCV13 should be followed with a dose of pneumococcal polysaccharide (PPSV23) vaccine. Adults who are at high risk for pneumococcal disease should obtain the PPSV23 vaccine at least 8 weeks after the dose of PCV13 vaccine. Adults older than 74 years of age who have normal immune system function should obtain the PPSV23 vaccine dose at least 1 year after the dose of PCV13 vaccine.  Pneumococcal polysaccharide (PPSV23) vaccine. When PCV13 is also indicated, PCV13 should be obtained first. All adults aged 65 years and older should be immunized. An adult younger than age 65 years who has certain medical conditions should be immunized. Any person who resides in a nursing home or long-term care facility should be immunized. An adult smoker should be immunized. People with an immunocompromised condition and certain other conditions should receive both PCV13 and PPSV23 vaccines. People with human immunodeficiency virus (HIV) infection should be immunized as soon as possible after diagnosis. Immunization during chemotherapy or radiation therapy should be avoided. Routine use of PPSV23 vaccine is not recommended for American Indians, [place] Natives, or people younger than 65 years unless there are medical conditions that require PPSV23 vaccine. When indicated, people who have unknown immunization and have no record of immunization should receive PPSV23 vaccine. One-time revaccination 5 years after the first dose of PPSV23 is recommended for people aged 19-64 years who have chronic kidney failure, nephrotic syndrome, asplenia, or immunocompromised conditions. People who received 1-2 doses of PPSV23 before age 65 years should receive another dose of PPSV23 vaccine at age 65 years or later if at least 5 years have passed since the previous dose. Doses of PPSV23 are not  needed for people immunized with PPSV23 at or after age 65 years.  Meningococcal vaccine. Adults with asplenia or persistent complement component deficiencies should receive 2 doses of quadrivalent meningococcal conjugate (MenACWY-D) vaccine. The doses should be obtained   at least 2 months apart. Microbiologists working with certain meningococcal bacteria, [place] recruits, people at risk during an outbreak, and people who travel to or live in countries with a high rate of meningitis should be immunized. A first-year college student up through age 34 years who is living in a residence hall should receive a dose if she did not receive a dose on or after her 16th birthday. Adults who have certain high-risk conditions should receive one or more doses of vaccine.  Hepatitis A vaccine. Adults who wish to be protected from this disease, have certain high-risk conditions, work with hepatitis A-infected animals, work in hepatitis A research labs, or travel to or work in countries with a high rate of hepatitis A should be immunized. Adults who were previously unvaccinated and who anticipate close contact with an international adoptee during the first 60 days after arrival in the [place] from a country with a high rate of hepatitis A should be immunized.  Hepatitis B vaccine. Adults who wish to be protected from this disease, have certain high-risk conditions, may be exposed to blood or other infectious body fluids, are household contacts or sex partners of hepatitis B positive people, are clients or workers in certain care facilities, or travel to or work in countries with a high rate of hepatitis B should be immunized.  Haemophilus influenzae type b (Hib) vaccine. A previously unvaccinated person with asplenia or sickle cell disease or having a scheduled splenectomy should receive 1 dose of Hib vaccine. Regardless of previous immunization, a recipient of a hematopoietic stem cell transplant should receive a  3-dose series 6-12 months after her successful transplant. Hib vaccine is not recommended for adults with HIV infection. Preventive Services / Frequency Ages 35 to 4 years  Blood pressure check.** / Every 3-5 years.  Lipid and cholesterol check.** / Every 5 years beginning at age 60.  Clinical breast exam.** / Every 3 years for women in their 71s and 10s.  BRCA-related cancer risk assessment.** / For women who have family members with a BRCA-related cancer (breast, ovarian, tubal, or peritoneal cancers).  Pap test.** / Every 2 years from ages 76 through 26. Every 3 years starting at age 61 through age 76 or 93 with a history of 3 consecutive normal Pap tests.  HPV screening.** / Every 3 years from ages 37 through ages 60 to 51 with a history of 3 consecutive normal Pap tests.  Hepatitis C blood test.** / For any individual with known risks for hepatitis C.  Skin self-exam. / Monthly.  Influenza vaccine. / Every year.  Tetanus, diphtheria, and acellular pertussis (Tdap, Td) vaccine.** / Consult your health care provider. Pregnant women should receive 1 dose of Tdap vaccine during each pregnancy. 1 dose of Td every 10 years.  Varicella vaccine.** / Consult your health care provider. Pregnant females who do not have evidence of immunity should receive the first dose after pregnancy.  HPV vaccine. / 3 doses over 6 months, if 93 and younger. The vaccine is not recommended for use in pregnant females. However, pregnancy testing is not needed before receiving a dose.  Measles, mumps, rubella (MMR) vaccine.** / You need at least 1 dose of MMR if you were born in 1957 or later. You may also need a 2nd dose. For females of childbearing age, rubella immunity should be determined. If there is no evidence of immunity, females who are not pregnant should be vaccinated. If there is no evidence of immunity, females who are  pregnant should delay immunization until after pregnancy.  Pneumococcal  13-valent conjugate (PCV13) vaccine.** / Consult your health care provider.  Pneumococcal polysaccharide (PPSV23) vaccine.** / 1 to 2 doses if you smoke cigarettes or if you have certain conditions.  Meningococcal vaccine.** / 1 dose if you are age 68 to 8 years and a Market researcher living in a residence hall, or have one of several medical conditions, you need to get vaccinated against meningococcal disease. You may also need additional booster doses.  Hepatitis A vaccine.** / Consult your health care provider.  Hepatitis B vaccine.** / Consult your health care provider.  Haemophilus influenzae type b (Hib) vaccine.** / Consult your health care provider. Ages 7 to 53 years  Blood pressure check.** / Every year.  Lipid and cholesterol check.** / Every 5 years beginning at age 25 years.  Lung cancer screening. / Every year if you are aged 11-80 years and have a 30-pack-year history of smoking and currently smoke or have quit within the past 15 years. Yearly screening is stopped once you have quit smoking for at least 15 years or develop a health problem that would prevent you from having lung cancer treatment.  Clinical breast exam.** / Every year after age 48 years.  BRCA-related cancer risk assessment.** / For women who have family members with a BRCA-related cancer (breast, ovarian, tubal, or peritoneal cancers).  Mammogram.** / Every year beginning at age 41 years and continuing for as long as you are in good health. Consult with your health care provider.  Pap test.** / Every 3 years starting at age 65 years through age 37 or 70 years with a history of 3 consecutive normal Pap tests.  HPV screening.** / Every 3 years from ages 72 years through ages 60 to 40 years with a history of 3 consecutive normal Pap tests.  Fecal occult blood test (FOBT) of stool. / Every year beginning at age 21 years and continuing until age 5 years. You may not need to do this test if you get  a colonoscopy every 10 years.  Flexible sigmoidoscopy or colonoscopy.** / Every 5 years for a flexible sigmoidoscopy or every 10 years for a colonoscopy beginning at age 35 years and continuing until age 48 years.  Hepatitis C blood test.** / For all people born from [birth year] through 1965 and any individual with known risks for hepatitis C.  Skin self-exam. / Monthly.  Influenza vaccine. / Every year.  Tetanus, diphtheria, and acellular pertussis (Tdap/Td) vaccine.** / Consult your health care provider. Pregnant women should receive 1 dose of Tdap vaccine during each pregnancy. 1 dose of Td every 10 years.  Varicella vaccine.** / Consult your health care provider. Pregnant females who do not have evidence of immunity should receive the first dose after pregnancy.  Zoster vaccine.** / 1 dose for adults aged 30 years or older.  Measles, mumps, rubella (MMR) vaccine.** / You need at least 1 dose of MMR if you were born in 1956 or later. You may also need a second dose. For females of childbearing age, rubella immunity should be determined. If there is no evidence of immunity, females who are not pregnant should be vaccinated. If there is no evidence of immunity, females who are pregnant should delay immunization until after pregnancy.  Pneumococcal 13-valent conjugate (PCV13) vaccine.** / Consult your health care provider.  Pneumococcal polysaccharide (PPSV23) vaccine.** / 1 to 2 doses if you smoke cigarettes or if you have certain conditions.  Meningococcal vaccine.** /  Consult your health care provider.  Hepatitis A vaccine.** / Consult your health care provider.  Hepatitis B vaccine.** / Consult your health care provider.  Haemophilus influenzae type b (Hib) vaccine.** / Consult your health care provider. Ages 35 years and over  Blood pressure check.** / Every year.  Lipid and cholesterol check.** / Every 5 years beginning at age 13 years.  Lung cancer screening. / Every year if you  are aged 66-80 years and have a 30-pack-year history of smoking and currently smoke or have quit within the past 15 years. Yearly screening is stopped once you have quit smoking for at least 15 years or develop a health problem that would prevent you from having lung cancer treatment.  Clinical breast exam.** / Every year after age 16 years.  BRCA-related cancer risk assessment.** / For women who have family members with a BRCA-related cancer (breast, ovarian, tubal, or peritoneal cancers).  Mammogram.** / Every year beginning at age 60 years and continuing for as long as you are in good health. Consult with your health care provider.  Pap test.** / Every 3 years starting at age 38 years through age 64 or 54 years with 3 consecutive normal Pap tests. Testing can be stopped between 65 and 70 years with 3 consecutive normal Pap tests and no abnormal Pap or HPV tests in the past 10 years.  HPV screening.** / Every 3 years from ages 5 years through ages 29 or 65 years with a history of 3 consecutive normal Pap tests. Testing can be stopped between 65 and 70 years with 3 consecutive normal Pap tests and no abnormal Pap or HPV tests in the past 10 years.  Fecal occult blood test (FOBT) of stool. / Every year beginning at age 51 years and continuing until age 98 years. You may not need to do this test if you get a colonoscopy every 10 years.  Flexible sigmoidoscopy or colonoscopy.** / Every 5 years for a flexible sigmoidoscopy or every 10 years for a colonoscopy beginning at age 38 years and continuing until age 79 years.  Hepatitis C blood test.** / For all people born from [birth year] through 1964 and any individual with known risks for hepatitis C.  Osteoporosis screening.** / A one-time screening for women ages 28 years and over and women at risk for fractures or osteoporosis.  Skin self-exam. / Monthly.  Influenza vaccine. / Every year.  Tetanus, diphtheria, and acellular pertussis (Tdap/Td)  vaccine.** / 1 dose of Td every 10 years.  Varicella vaccine.** / Consult your health care provider.  Zoster vaccine.** / 1 dose for adults aged 14 years or older.  Pneumococcal 13-valent conjugate (PCV13) vaccine.** / Consult your health care provider.  Pneumococcal polysaccharide (PPSV23) vaccine.** / 1 dose for all adults aged 37 years and older.  Meningococcal vaccine.** / Consult your health care provider.  Hepatitis A vaccine.** / Consult your health care provider.  Hepatitis B vaccine.** / Consult your health care provider.  Haemophilus influenzae type b (Hib) vaccine.** / Consult your health care provider. ** Family history and personal history of risk and conditions may change your health care provider's recommendations.   This information is not intended to replace advice given to you by your health care provider. Make sure you discuss any questions you have with your health care provider.   Document Released: 01/05/2002 Document Revised: 11/30/2014 Document Reviewed: 04/06/2011 [employer] Interactive Patient Education [employer].

## 2016-01-29 ENCOUNTER — Ambulatory Visit (INDEPENDENT_AMBULATORY_CARE_PROVIDER_SITE_OTHER): Payer: Commercial Managed Care - HMO

## 2016-01-29 ENCOUNTER — Other Ambulatory Visit: Payer: Self-pay

## 2016-01-29 VITALS — BP 160/60 | Ht 63.0 in | Wt 240.0 lb

## 2016-01-29 DIAGNOSIS — E1129 Type 2 diabetes mellitus with other diabetic kidney complication: Secondary | ICD-10-CM

## 2016-01-29 DIAGNOSIS — E2839 Other primary ovarian failure: Secondary | ICD-10-CM

## 2016-01-29 DIAGNOSIS — Z0000 Encounter for general adult medical examination without abnormal findings: Secondary | ICD-10-CM | POA: Diagnosis not present

## 2016-01-29 NOTE — Patient Instructions (Addendum)
Bridget Oliver , Thank you for taking time to come for your Medicare Wellness Visit. I appreciate your ongoing commitment to your health goals. Please review the following plan we discussed and let me know if I can assist you in the future.   Will get eye exam scheduled  (will fup with Dr. Camila Li or Dermatologist for mole that has extended on face)   Will have ua for micro albumin check   Will have dexa scan at [facility] where you get your mammogram   [place] offers free advance directive forms, (health care power of attorney)  as well as assistance in completing the forms themselves.  For assistance, contact the Spiritual Care Department at [phone], or the Clinical Social Work Department at [phone].   These are the goals we discussed: Goals    . Exercise 150 minutes per week (moderate activity)     Wants to exercise more; bike x 30 minutes a day or walks at the mall Will start out slow;after 3 weeks; start with short burst of faster activity as tolerated        This is a list of the screening recommended for you and due dates:  Health Maintenance  Topic Date Due  . Complete foot exam   05/08/1951  . Eye exam for diabetics  05/08/1951  . Urine Protein Check  05/08/1951  . DEXA scan (bone density measurement)  05/09/2007  . Hemoglobin A1C  06/14/2016  . Flu Shot  06/23/2016  . Mammogram  03/10/2017  . Colon Cancer Screening  11/02/2017  . Tetanus Vaccine  11/09/2021  . Shingles Vaccine  Completed  . Pneumonia vaccines  Completed    '   Bone Densitometry Bone densitometry is an imaging test that uses a special X-ray to measure the amount of calcium and other minerals in your bones (bone density). This test is also known as a bone mineral density test or dual-energy X-ray absorptiometry (DXA). The test can measure bone density at your hip and your spine. It is similar to having a regular X-ray. You may have this test to:  Diagnose a condition that causes weak or thin  bones (osteoporosis).  Predict your risk of a broken bone (fracture).  Determine how well osteoporosis treatment is working. LET [facility] CARE PROVIDER KNOW ABOUT:  Any allergies you have.  All medicines you are taking, including vitamins, herbs, eye drops, creams, and over-the-counter medicines.  Previous problems you or members of your family have had with the use of anesthetics.  Any blood disorders you have.  Previous surgeries you have had.  Medical conditions you have.  Possibility of pregnancy.  Any other medical test you had within the previous 14 days that used contrast material. RISKS AND COMPLICATIONS Generally, this is a safe procedure. However, problems can occur and may include the following:  This test exposes you to a very small amount of radiation.  The risks of radiation exposure may be greater to unborn children. BEFORE THE PROCEDURE  Do not take any calcium supplements for 24 hours before having the test. You can otherwise eat and drink what you usually do.  Take off all metal jewelry, eyeglasses, dental appliances, and any other metal objects. PROCEDURE  You may lie on an exam table. There will be an X-ray generator below you and an imaging device above you.  Other devices, such as boxes or braces, may be used to position your body properly for the scan.  You will need to lie still  while the machine slowly scans your body.  The images will show up on a computer monitor. AFTER THE PROCEDURE You may need more testing at a later time.   This information is not intended to replace advice given to you by your health care provider. Make sure you discuss any questions you have with your health care provider.   Document Released: 12/01/2004 Document Revised: 11/30/2014 Document Reviewed: 04/19/2014 Elsevier Interactive Patient Education 2016 [place].  Diabetes and Foot Care Diabetes may cause you to have problems because of poor blood supply  (circulation) to your feet and legs. This may cause the skin on your feet to become thinner, break easier, and heal more slowly. Your skin may become dry, and the skin may peel and crack. You may also have nerve damage in your legs and feet causing decreased feeling in them. You may not notice minor injuries to your feet that could lead to infections or more serious problems. Taking care of your feet is one of the most important things you can do for yourself.  HOME CARE INSTRUCTIONS  Wear shoes at all times, even in the house. Do not go barefoot. Bare feet are easily injured.  Check your feet daily for blisters, cuts, and redness. If you cannot see the bottom of your feet, use a mirror or ask someone for help.  Wash your feet with warm water (do not use hot water) and mild soap. Then pat your feet and the areas between your toes until they are completely dry. Do not soak your feet as this can dry your skin.  Apply a moisturizing lotion or petroleum jelly (that does not contain alcohol and is unscented) to the skin on your feet and to dry, brittle toenails. Do not apply lotion between your toes.  Trim your toenails straight across. Do not dig under them or around the cuticle. File the edges of your nails with an emery board or nail file.  Do not cut corns or calluses or try to remove them with medicine.  Wear clean socks or stockings every day. Make sure they are not too tight. Do not wear knee-high stockings since they may decrease blood flow to your legs.  Wear shoes that fit properly and have enough cushioning. To break in new shoes, wear them for just a few hours a day. This prevents you from injuring your feet. Always look in your shoes before you put them on to be sure there are no objects inside.  Do not cross your legs. This may decrease the blood flow to your feet.  If you find a minor scrape, cut, or break in the skin on your feet, keep it and the skin around it clean and dry. These  areas may be cleansed with mild soap and water. Do not cleanse the area with peroxide, alcohol, or iodine.  When you remove an adhesive bandage, be sure not to damage the skin around it.  If you have a wound, look at it several times a day to make sure it is healing.  Do not use heating pads or hot water bottles. They may burn your skin. If you have lost feeling in your feet or legs, you may not know it is happening until it is too late.  Make sure your health care provider performs a complete foot exam at least annually or more often if you have foot problems. Report any cuts, sores, or bruises to your health care provider immediately. SEEK MEDICAL CARE IF:  You have an injury that is not healing.  You have cuts or breaks in the skin.  You have an ingrown nail.  You notice redness on your legs or feet.  You feel burning or tingling in your legs or feet.  You have pain or cramps in your legs and feet.  Your legs or feet are numb.  Your feet always feel cold. SEEK IMMEDIATE MEDICAL CARE IF:   There is increasing redness, swelling, or pain in or around a wound.  There is a red line that goes up your leg.  Pus is coming from a wound.  You develop a fever or as directed by your health care provider.  You notice a bad smell coming from an ulcer or wound.   This information is not intended to replace advice given to you by your health care provider. Make sure you discuss any questions you have with your health care provider.   Document Released: 11/06/2000 Document Revised: 07/12/2013 Document Reviewed: 04/18/2013 Elsevier Interactive Patient Education 2016 [place] in the Home  Falls can cause injuries. They can happen to people of all ages. There are many things you can do to make your home safe and to help prevent falls.  WHAT CAN I DO ON THE OUTSIDE OF MY HOME?  Regularly fix the edges of walkways and driveways and fix any cracks.  Remove  anything that might make you trip as you walk through a door, such as a raised step or threshold.  Trim any bushes or trees on the path to your home.  Use bright outdoor lighting.  Clear any walking paths of anything that might make someone trip, such as rocks or tools.  Regularly check to see if handrails are loose or broken. Make sure that both sides of any steps have handrails.  Any raised decks and porches should have guardrails on the edges.  Have any leaves, snow, or ice cleared regularly.  Use sand or salt on walking paths during winter.  Clean up any spills in your garage right away. This includes oil or grease spills. WHAT CAN I DO IN THE BATHROOM?   Use night lights.  Install grab bars by the toilet and in the tub and shower. Do not use towel bars as grab bars.  Use non-skid mats or decals in the tub or shower.  If you need to sit down in the shower, use a plastic, non-slip stool.  Keep the floor dry. Clean up any water that spills on the floor as soon as it happens.  Remove soap buildup in the tub or shower regularly.  Attach bath mats securely with double-sided non-slip rug tape.  Do not have throw rugs and other things on the floor that can make you trip. WHAT CAN I DO IN THE BEDROOM?  Use night lights.  Make sure that you have a light by your bed that is easy to reach.  Do not use any sheets or blankets that are too big for your bed. They should not hang down onto the floor.  Have a firm chair that has side arms. You can use this for support while you get dressed.  Do not have throw rugs and other things on the floor that can make you trip. WHAT CAN I DO IN THE KITCHEN?  Clean up any spills right away.  Avoid walking on wet floors.  Keep items that you use a lot in easy-to-reach places.  If you need to reach  something above you, use a strong step stool that has a grab bar.  Keep electrical cords out of the way.  Do not use floor polish or wax  that makes floors slippery. If you must use wax, use non-skid floor wax.  Do not have throw rugs and other things on the floor that can make you trip. WHAT CAN I DO WITH MY STAIRS?  Do not leave any items on the stairs.  Make sure that there are handrails on both sides of the stairs and use them. Fix handrails that are broken or loose. Make sure that handrails are as long as the stairways.  Check any carpeting to make sure that it is firmly attached to the stairs. Fix any carpet that is loose or worn.  Avoid having throw rugs at the top or bottom of the stairs. If you do have throw rugs, attach them to the floor with carpet tape.  Make sure that you have a light switch at the top of the stairs and the bottom of the stairs. If you do not have them, ask someone to add them for you. WHAT ELSE CAN I DO TO HELP PREVENT FALLS?  Wear shoes that:  Do not have high heels.  Have rubber bottoms.  Are comfortable and fit you well.  Are closed at the toe. Do not wear sandals.  If you use a stepladder:  Make sure that it is fully opened. Do not climb a closed stepladder.  Make sure that both sides of the stepladder are locked into place.  Ask someone to hold it for you, if possible.  Clearly mark and make sure that you can see:  Any grab bars or handrails.  First and last steps.  Where the edge of each step is.  Use tools that help you move around (mobility aids) if they are needed. These include:  Canes.  Walkers.  Scooters.  Crutches.  Turn on the lights when you go into a dark area. Replace any light bulbs as soon as they burn out.  Set up your furniture so you have a clear path. Avoid moving your furniture around.  If any of your floors are uneven, fix them.  If there are any pets around you, be aware of where they are.  Review your medicines with your doctor. Some medicines can make you feel dizzy. This can increase your chance of falling. Ask your doctor what  other things that you can do to help prevent falls.   This information is not intended to replace advice given to you by your health care provider. Make sure you discuss any questions you have with your health care provider.   Document Released: 09/05/2009 Document Revised: 03/26/2015 Document Reviewed: 12/14/2014 Elsevier Interactive Patient Education 2016 [place] Maintenance, Female Adopting a healthy lifestyle and getting preventive care can go a long way to promote health and wellness. Talk with your health care provider about what schedule of regular examinations is right for you. This is a good chance for you to check in with your provider about disease prevention and staying healthy. In between checkups, there are plenty of things you can do on your own. Experts have done a lot of research about which lifestyle changes and preventive measures are most likely to keep you healthy. Ask your health care provider for more information. WEIGHT AND DIET  Eat a healthy diet  Be sure to include plenty of vegetables, fruits, low-fat dairy products, and lean protein.  Do not eat a lot of foods high in solid fats, added sugars, or salt.  Get regular exercise. This is one of the most important things you can do for your health.  Most adults should exercise for at least 150 minutes each week. The exercise should increase your heart rate and make you sweat (moderate-intensity exercise).  Most adults should also do strengthening exercises at least twice a week. This is in addition to the moderate-intensity exercise.  Maintain a healthy weight  Body mass index (BMI) is a measurement that can be used to identify possible weight problems. It estimates body fat based on height and weight. Your health care provider can help determine your BMI and help you achieve or maintain a healthy weight.  For females 19 years of age and older:   A BMI below 18.5 is considered underweight.  A BMI  of 18.5 to 24.9 is normal.  A BMI of 25 to 29.9 is considered overweight.  A BMI of 30 and above is considered obese.  Watch levels of cholesterol and blood lipids  You should start having your blood tested for lipids and cholesterol at 74 years of age, then have this test every 5 years.  You may need to have your cholesterol levels checked more often if:  Your lipid or cholesterol levels are high.  You are older than 74 years of age.  You are at high risk for heart disease.  CANCER SCREENING   Lung Cancer  Lung cancer screening is recommended for adults 66-52 years old who are at high risk for lung cancer because of a history of smoking.  A yearly low-dose CT scan of the lungs is recommended for people who:  Currently smoke.  Have quit within the past 15 years.  Have at least a 30-pack-year history of smoking. A pack year is smoking an average of one pack of cigarettes a day for 1 year.  Yearly screening should continue until it has been 15 years since you quit.  Yearly screening should stop if you develop a health problem that would prevent you from having lung cancer treatment.  Breast Cancer  Practice breast self-awareness. This means understanding how your breasts normally appear and feel.  It also means doing regular breast self-exams. Let your health care provider know about any changes, no matter how small.  If you are in your 20s or 30s, you should have a clinical breast exam (CBE) by a health care provider every 1-3 years as part of a regular health exam.  If you are 69 or older, have a CBE every year. Also consider having a breast X-ray (mammogram) every year.  If you have a family history of breast cancer, talk to your health care provider about genetic screening.  If you are at high risk for breast cancer, talk to your health care provider about having an MRI and a mammogram every year.  Breast cancer gene (BRCA) assessment is recommended for women who  have family members with BRCA-related cancers. BRCA-related cancers include:  Breast.  Ovarian.  Tubal.  Peritoneal cancers.  Results of the assessment will determine the need for genetic counseling and BRCA1 and BRCA2 testing. Cervical Cancer Your health care provider may recommend that you be screened regularly for cancer of the pelvic organs (ovaries, uterus, and vagina). This screening involves a pelvic examination, including checking for microscopic changes to the surface of your cervix (Pap test). You may be encouraged to have this screening done every 3 years,  beginning at age 79.  For women ages 73-65, health care providers may recommend pelvic exams and Pap testing every 3 years, or they may recommend the Pap and pelvic exam, combined with testing for human papilloma virus (HPV), every 5 years. Some types of HPV increase your risk of cervical cancer. Testing for HPV may also be done on women of any age with unclear Pap test results.  Other health care providers may not recommend any screening for nonpregnant women who are considered low risk for pelvic cancer and who do not have symptoms. Ask your health care provider if a screening pelvic exam is right for you.  If you have had past treatment for cervical cancer or a condition that could lead to cancer, you need Pap tests and screening for cancer for at least 20 years after your treatment. If Pap tests have been discontinued, your risk factors (such as having a new sexual partner) need to be reassessed to determine if screening should resume. Some women have medical problems that increase the chance of getting cervical cancer. In these cases, your health care provider may recommend more frequent screening and Pap tests. Colorectal Cancer  This type of cancer can be detected and often prevented.  Routine colorectal cancer screening usually begins at 74 years of age and continues through 74 years of age.  Your health care provider  may recommend screening at an earlier age if you have risk factors for colon cancer.  Your health care provider may also recommend using home test kits to check for hidden blood in the stool.  A small camera at the end of a tube can be used to examine your colon directly (sigmoidoscopy or colonoscopy). This is done to check for the earliest forms of colorectal cancer.  Routine screening usually begins at age 54.  Direct examination of the colon should be repeated every 5-10 years through 74 years of age. However, you may need to be screened more often if early forms of precancerous polyps or small growths are found. Skin Cancer  Check your skin from head to toe regularly.  Tell your health care provider about any new moles or changes in moles, especially if there is a change in a mole's shape or color.  Also tell your health care provider if you have a mole that is larger than the size of a pencil eraser.  Always use sunscreen. Apply sunscreen liberally and repeatedly throughout the day.  Protect yourself by wearing long sleeves, pants, a wide-brimmed hat, and sunglasses whenever you are outside. HEART DISEASE, DIABETES, AND HIGH BLOOD PRESSURE   High blood pressure causes heart disease and increases the risk of stroke. High blood pressure is more likely to develop in:  People who have blood pressure in the high end of the normal range (130-139/85-89 mm Hg).  People who are overweight or obese.  People who are African American.  If you are 2-33 years of age, have your blood pressure checked every 3-5 years. If you are 46 years of age or older, have your blood pressure checked every year. You should have your blood pressure measured twice--once when you are at a hospital or clinic, and once when you are not at a hospital or clinic. Record the average of the two measurements. To check your blood pressure when you are not at a hospital or clinic, you can use:  An automated blood  pressure machine at a pharmacy.  A home blood pressure monitor.  If you are  between 4 years and 77 years old, ask your health care provider if you should take aspirin to prevent strokes.  Have regular diabetes screenings. This involves taking a blood sample to check your fasting blood sugar level.  If you are at a normal weight and have a low risk for diabetes, have this test once every three years after 74 years of age.  If you are overweight and have a high risk for diabetes, consider being tested at a younger age or more often. PREVENTING INFECTION  Hepatitis B  If you have a higher risk for hepatitis B, you should be screened for this virus. You are considered at high risk for hepatitis B if:  You were born in a country where hepatitis B is common. Ask your health care provider which countries are considered high risk.  Your parents were born in a high-risk country, and you have not been immunized against hepatitis B (hepatitis B vaccine).  You have HIV or AIDS.  You use needles to inject street drugs.  You live with someone who has hepatitis B.  You have had sex with someone who has hepatitis B.  You get hemodialysis treatment.  You take certain medicines for conditions, including cancer, organ transplantation, and autoimmune conditions. Hepatitis C  Blood testing is recommended for:  Everyone born from [birth year] through 1964.  Anyone with known risk factors for hepatitis C. Sexually transmitted infections (STIs)  You should be screened for sexually transmitted infections (STIs) including gonorrhea and chlamydia if:  You are sexually active and are younger than 74 years of age.  You are older than 74 years of age and your health care provider tells you that you are at risk for this type of infection.  Your sexual activity has changed since you were last screened and you are at an increased risk for chlamydia or gonorrhea. Ask your health care provider if you are at  risk.  If you do not have HIV, but are at risk, it may be recommended that you take a prescription medicine daily to prevent HIV infection. This is called pre-exposure prophylaxis (PrEP). You are considered at risk if:  You are sexually active and do not regularly use condoms or know the HIV status of your partner(s).  You take drugs by injection.  You are sexually active with a partner who has HIV. Talk with your health care provider about whether you are at high risk of being infected with HIV. If you choose to begin PrEP, you should first be tested for HIV. You should then be tested every 3 months for as long as you are taking PrEP.  PREGNANCY   If you are premenopausal and you may become pregnant, ask your health care provider about preconception counseling.  If you may become pregnant, take 400 to 800 micrograms (mcg) of folic acid every day.  If you want to prevent pregnancy, talk to your health care provider about birth control (contraception). OSTEOPOROSIS AND MENOPAUSE   Osteoporosis is a disease in which the bones lose minerals and strength with aging. This can result in serious bone fractures. Your risk for osteoporosis can be identified using a bone density scan.  If you are 67 years of age or older, or if you are at risk for osteoporosis and fractures, ask your health care provider if you should be screened.  Ask your health care provider whether you should take a calcium or vitamin D supplement to lower your risk for osteoporosis.  Menopause may  have certain physical symptoms and risks.  Hormone replacement therapy may reduce some of these symptoms and risks. Talk to your health care provider about whether hormone replacement therapy is right for you.  HOME CARE INSTRUCTIONS   Schedule regular health, dental, and eye exams.  Stay current with your immunizations.   Do not use any tobacco products including cigarettes, chewing tobacco, or electronic cigarettes.  If  you are pregnant, do not drink alcohol.  If you are breastfeeding, limit how much and how often you drink alcohol.  Limit alcohol intake to no more than 1 drink per day for nonpregnant women. One drink equals 12 ounces of beer, 5 ounces of wine, or 1 ounces of hard liquor.  Do not use street drugs.  Do not share needles.  Ask your health care provider for help if you need support or information about quitting drugs.  Tell your health care provider if you often feel depressed.  Tell your health care provider if you have ever been abused or do not feel safe at home.   This information is not intended to replace advice given to you by your health care provider. Make sure you discuss any questions you have with your health care provider.   Document Released: 05/25/2011 Document Revised: 11/30/2014 Document Reviewed: 10/11/2013 Elsevier Interactive Patient Education [employer].

## 2016-01-29 NOTE — Progress Notes (Signed)
Subjective:   Bridget Oliver is a 75 y.o. female who presents for Medicare Annual (Subsequent) preventive examination.  Review of Systems:   Cardiac Risk Factors include: advanced age (>48men, >43 women);dyslipidemia;family history of premature cardiovascular disease;hypertension;obesity (BMI >30kg/m2);sedentary lifestyle HRA assessment completed during visit; [place] The Patient was informed that this wellness visit is to identify risk and educate on how to reduce risk for increase disease through lifestyle changes.   ROS deferred to CPE exam with physician  Medical and family hx Family hx of (father heavy smoker)  lung cancer Mother and father had HTN  Medical issues  The patient presents for a follow-up of  chronic hypertension, CRF, chronic dyslipidemia, type 2, gout;  Lipids 12/16/15; chol 142; Trig 127; HDL 30 LDL 86  A1c 6.2  Educated on pre-diabetes and HDL; The patient set a goal to exercise to assist with pre-diabetes and inc'ing HDL;   Describes health as fair; it is not great because she take meds Admits weight is a factor  BMI: Morbid obesity/ 42 Diet;  Typical am has a boiled egg; scrambled egg with cheese; cheese toast [place]; bagel and cream cheese Lunch; sub sandwich; may stop and eat  Supper; depends on when she eats lunch; Eats popcorn; pears for snacks  Has greens on hand; cabbage; spinach;  Does not eat fried foods; Trig are < 150   Exercise; does not do anything now Discussed plan by the patient to: Bike x 30 minutes a day as she has stationary bike at home/ also can walk at the mall and will start using 3 lb weights when she builds up her endurance;    SAFETY; lives with spouse; is fairly active;  Falls; in the last year; no  Hearing; 4000hz   Vision: pretty good but understands she needs to schedule eye exam Safety reviewed for the home reviewed  Removal of clutter clearing paths through the home,  Bathroom safety; shower is in the tub; no grab  bars;  Community safety; yes;  Smoke detectors yes Firearms safety ; if firearms keep them in a safe place Driving accidents and seatbelt/ no Sun protection/ during the summer;  Concerned about extension of mole on face; will make apt with dermatologist or come back to have Dr. Alain Marion look at this  Stressors /not really stressed at present  Medication review/ New meds/ compliant with meds   Fall assessment / no  Gait assessment; get up and go good; weight does not inhibit her IADL or activity   Mobilization and Functional losses in the last year./ not really  Sleep patterns/ sometimes; goes to be 2am and up late in the am; Educated regarding sleep rhythms and the benefit of going to bed at the same time q pm around 10 or 11pm.    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 Herbert Deaner;  Immunizations / none due Health advice or referrals Dermatology or Dr. Alain Marion to check mole on face  Current Care Team reviewed and updated  Objective:     Vitals: BP 160/60 mmHg  Ht 5\' 3"  (1.6 m)  Wt 240 lb (108.863 kg)  BMI 42.52 kg/m2  Tobacco History  Smoking status  . Never Smoker   Smokeless tobacco  . Never Used     Counseling given: Yes   Past Medical History  Diagnosis Date  . Hypertension   . Hyperlipidemia   . Hyperglycemia   . Swelling of lower limb   . Obesity   . CRI (chronic renal insufficiency)     Stage 3  . Lactose intolerance   . Diarrhea   . Gout   . Macular  degeneration   . 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 Cesarean section  1969  . Abdominal hysterectomy  1985  . Lumbar laminectomy  2007  . Cataract extraction  2010 & 2011    bilateral  . Pars plana vitrectomy w/ repair of macular hole  2010 & 2011    Bilateral  . Appendectomy  1985   Family History  Problem Relation Age of Onset  . Cancer Other     Lung  . Hypertension Mother   . Hypertension Father    History  Sexual Activity  . Sexual Activity: Not on file    Outpatient Encounter Prescriptions as of 01/29/2016  Medication Sig  . acetaminophen (TYLENOL) 325 MG tablet Take 2 tablets (650 mg total) by mouth every 6 (six) hours as needed for moderate pain.  Marland Kitchen atorvastatin (LIPITOR) 40 MG tablet Take 1 tablet (40 mg total) by mouth daily.  . carvedilol (COREG) 25 MG tablet TAKE 1 TABLET TWICE DAILY WITH A MEAL  . Cholecalciferol (VITAMIN D3) 2000 UNITS capsule Take 1 capsule (2,000 Units total) by mouth daily.  . Coenzyme Q10 (COQ10) 100 MG CAPS Take by mouth.  . Febuxostat (ULORIC) 80 MG TABS Take 1 tablet (80 mg total) by mouth daily.  . Fish Oil-Cholecalciferol (FISH OIL + D3) 1200-1000 MG-UNIT CAPS Take 1 tablet by mouth 2 (two) times daily.   . furosemide (LASIX) 80 MG tablet Take 1 tablet (80 mg total) by mouth daily.  . hydrALAZINE (APRESOLINE) 25 MG tablet Take 1 tablet (25 mg total) by mouth 3 (three) times daily.  . Multiple Vitamins-Minerals (CENTRUM SILVER PO) Take 1 tablet by mouth daily.   . nitroGLYCERIN (NITROSTAT) 0.4 MG SL tablet Place 0.4 mg under the tongue every 5 (five) minutes as needed for chest pain.  Vladimir Faster Glycol-Propyl Glycol (SYSTANE FREE OP) Place 1 drop into both eyes daily as needed.  . potassium chloride SA (K-DUR,KLOR-CON) 20 MEQ tablet Take 1 tablet (20 mEq total) by mouth daily.  . vitamin B-12 (CYANOCOBALAMIN) 1000 MCG tablet Take 500 mcg by mouth every other day.   .  vitamin C (ASCORBIC ACID) 500 MG tablet Take 500 mg by mouth daily as needed (for cold).    No facility-administered encounter medications on file as of 01/29/2016.    Activities of Daily Living In your present state of health, do you have any difficulty performing the following activities: 01/29/2016  Hearing? N  Vision? N  Difficulty concentrating or making decisions? (No Data)  Walking or climbing stairs? N  Dressing or bathing? N  Doing errands, shopping? N  Preparing Food and eating ? N  Using the Toilet? N  In the past six months, have you accidently leaked urine? N  Do you have problems with loss  of bowel control? N  Managing your Medications? N  Managing your Finances? N  Housekeeping or managing your Housekeeping? N    Patient Care Team: Cassandria Anger, MD as PCP - General Edrick Oh, MD (Nephrology) Monna Fam, MD (Ophthalmology) Mcarthur Rossetti, MD (Orthopedic Surgery)    Assessment:    Pleasant cooperative with plans to exercise more;   Exercise Activities and Dietary recommendations Current Exercise Habits: Home exercise routine, Type of exercise: walking, Time (Minutes): 30, Frequency (Times/Week): 5, Weekly Exercise (Minutes/Week): 150, Intensity: Moderate  Goals    . Exercise 150 minutes per week (moderate activity)     Wants to exercise more; bike x 30 minutes a day or walks at the mall Will start out slow;after 3 weeks; start with short burst of faster activity as tolerated       Fall Risk Fall Risk  01/29/2016 08/16/2015  Falls in the past year? No No   Depression Screen PHQ 2/9 Scores 01/29/2016 08/16/2015  PHQ - 2 Score 0 0     Cognitive Testing No flowsheet data found.   AD8 score 0   Immunization History  Administered Date(s) Administered  . Influenza Whole 10/07/2010, 07/25/2011, 08/26/2012  . Influenza, High Dose Seasonal PF 09/14/2013  . Influenza,inj,Quad PF,36+ Mos 07/25/2014, 08/16/2015  . Pneumococcal Conjugate-13  11/26/2014  . Pneumococcal Polysaccharide-23 11/10/2011  . Tdap 11/10/2011  . Zoster 02/28/2008   Screening Tests Health Maintenance  Topic Date Due  . FOOT EXAM  05/08/1951  . OPHTHALMOLOGY EXAM  [birth year]/08/1951  . URINE MICROALBUMIN  05/08/1952  . DEXA SCAN  05/09/2007  . HEMOGLOBIN A1C  06/14/2016  . INFLUENZA VACCINE  06/23/2016  . MAMMOGRAM  03/10/2017  . COLONOSCOPY  11/02/2017  . TETANUS/TDAP  11/09/2021  . ZOSTAVAX  Completed  . PNA vac Low Risk Adult  Completed      Plan:   Will get eye exam scheduled  (will fup with Dr. Camila Li or Dermatologist for mole that has extended on face)   Will have ua for micro albumin check   Will have dexa scan at [facility] where you get your mammogram   [facility] offers free advance directive forms, (health care power of attorney)  as well as assistance in completing the forms themselves.  For assistance, contact the Spiritual Care Department at [phone], or the Clinical Social Work Department at [phone].   During the course of the visit the patient was educated and counseled about the following appropriate screening and preventive services:   Vaccines to include Pneumoccal, Influenza, Hepatitis B, Td, Zostavax, HCV/ up to date  Electrocardiogram -05/2015  Cardiovascular Disease/ no chest pain/ BP elevated this am and exercise will probably help   Colorectal cancer screening/ due 2018  Bone density screening/ will have this done at [facility]   Diabetes screening/ educated regarding pre diabetes  Glaucoma screening/ to schedule eye exam  Mammography/ q year at [place] counseling / discussed;   Exercise plan in place   Patient Instructions (the written plan) was given to the patient.   Wynetta Fines, RN  01/29/2016

## 2016-01-29 NOTE — Progress Notes (Signed)
Medical screening examination/treatment/procedure(s) were performed by non-physician practitioner and as supervising physician I was immediately available for consultation/collaboration. I agree with above. Elizabeth A Crawford, MD 

## 2016-03-12 ENCOUNTER — Encounter: Payer: Self-pay | Admitting: Internal Medicine

## 2016-03-12 LAB — HM MAMMOGRAPHY

## 2016-03-12 LAB — HM DEXA SCAN

## 2016-03-16 ENCOUNTER — Encounter: Payer: Self-pay | Admitting: Internal Medicine

## 2016-03-25 LAB — HM DIABETES EYE EXAM

## 2016-03-30 ENCOUNTER — Other Ambulatory Visit: Payer: Self-pay | Admitting: Internal Medicine

## 2016-04-10 ENCOUNTER — Encounter: Payer: Self-pay | Admitting: Internal Medicine

## 2016-05-15 ENCOUNTER — Ambulatory Visit (INDEPENDENT_AMBULATORY_CARE_PROVIDER_SITE_OTHER): Payer: Commercial Managed Care - HMO | Admitting: Internal Medicine

## 2016-05-15 ENCOUNTER — Encounter: Payer: Self-pay | Admitting: Internal Medicine

## 2016-05-15 VITALS — BP 138/60 | HR 71 | Wt 238.0 lb

## 2016-05-15 DIAGNOSIS — I129 Hypertensive chronic kidney disease with stage 1 through stage 4 chronic kidney disease, or unspecified chronic kidney disease: Secondary | ICD-10-CM | POA: Diagnosis not present

## 2016-05-15 DIAGNOSIS — M545 Low back pain, unspecified: Secondary | ICD-10-CM | POA: Insufficient documentation

## 2016-05-15 DIAGNOSIS — E538 Deficiency of other specified B group vitamins: Secondary | ICD-10-CM | POA: Diagnosis not present

## 2016-05-15 DIAGNOSIS — E1122 Type 2 diabetes mellitus with diabetic chronic kidney disease: Secondary | ICD-10-CM

## 2016-05-15 DIAGNOSIS — N183 Chronic kidney disease, stage 3 (moderate): Secondary | ICD-10-CM

## 2016-05-15 DIAGNOSIS — M79604 Pain in right leg: Secondary | ICD-10-CM | POA: Insufficient documentation

## 2016-05-15 MED ORDER — TIZANIDINE HCL 4 MG PO TABS: 4.0000 mg | ORAL_TABLET | Freq: Three times a day (TID) | ORAL | Status: DC | PRN

## 2016-05-15 MED ORDER — ACETAMINOPHEN-CODEINE #3 300-30 MG PO TABS: 1.0000 | ORAL_TABLET | Freq: Three times a day (TID) | ORAL | Status: DC | PRN

## 2016-05-15 NOTE — Patient Instructions (Signed)

## 2016-05-15 NOTE — Assessment & Plan Note (Signed)
On B12 

## 2016-05-15 NOTE — Progress Notes (Signed)
Pre visit review using our clinic review tool, if applicable. No additional management support is needed unless otherwise documented below in the visit note. 

## 2016-05-15 NOTE — Progress Notes (Signed)
Subjective:  Patient ID: Bridget Oliver, female    DOB: November 12, 1941  Age: 75 y.o. MRN: [id number]  CC: Back Pain   HPI Bridget Oliver presents for LBP irrad to the R upper leg x10 d. 3-7/10 in intensity. Worse w/activity. Tylenol helped very little F/u CRI, HTN  Outpatient Prescriptions Prior to Visit  Medication Sig Dispense Refill  . acetaminophen (TYLENOL) 325 MG tablet Take 2 tablets (650 mg total) by mouth every 6 (six) hours as needed for moderate pain. 30 tablet 0  . atorvastatin (LIPITOR) 40 MG tablet Take 1 tablet (40 mg total) by mouth daily. 90 tablet 3  . carvedilol (COREG) 25 MG tablet TAKE 1 TABLET TWICE DAILY WITH A MEAL 180 tablet 3  . Cholecalciferol (VITAMIN D3) 2000 UNITS capsule Take 1 capsule (2,000 Units total) by mouth daily. 100 capsule 3  . Coenzyme Q10 (COQ10) 100 MG CAPS Take by mouth.    . Febuxostat (ULORIC) 80 MG TABS Take 1 tablet (80 mg total) by mouth daily. 90 tablet 3  . Fish Oil-Cholecalciferol (FISH OIL + D3) 1200-1000 MG-UNIT CAPS Take 1 tablet by mouth 2 (two) times daily.     . furosemide (LASIX) 80 MG tablet Take 1 tablet (80 mg total) by mouth daily. 90 tablet 3  . hydrALAZINE (APRESOLINE) 25 MG tablet TAKE 1 TABLET (25 MG TOTAL) BY MOUTH 3 (THREE) TIMES DAILY. 270 tablet 1  . Multiple Vitamins-Minerals (CENTRUM SILVER PO) Take 1 tablet by mouth daily.     . nitroGLYCERIN (NITROSTAT) 0.4 MG SL tablet Place 0.4 mg under the tongue every 5 (five) minutes as needed for chest pain.    Vladimir Faster Glycol-Propyl Glycol (SYSTANE FREE OP) Place 1 drop into both eyes daily as needed.    . potassium chloride SA (K-DUR,KLOR-CON) 20 MEQ tablet Take 1 tablet (20 mEq total) by mouth daily. 90 tablet 3  . vitamin B-12 (CYANOCOBALAMIN) 1000 MCG tablet Take 500 mcg by mouth every other day.     . vitamin C (ASCORBIC ACID) 500 MG tablet Take 500 mg by mouth daily as needed (for cold).      No facility-administered medications prior to visit.    ROS Review of Systems   Constitutional: Negative for chills, activity change, appetite change, fatigue and unexpected weight change.  HENT: Negative for congestion, mouth sores and sinus pressure.   Eyes: Negative for visual disturbance.  Respiratory: Negative for cough and chest tightness.   Gastrointestinal: Negative for nausea and abdominal pain.  Genitourinary: Negative for frequency, difficulty urinating and vaginal pain.  Musculoskeletal: Positive for back pain. Negative for gait problem.  Skin: Negative for pallor and rash.  Neurological: Negative for dizziness, tremors, weakness, numbness and headaches.  Psychiatric/Behavioral: Negative for confusion and sleep disturbance.    Objective:  BP 138/60 mmHg  Pulse 71  Wt 238 lb (107.956 kg)  SpO2 97%  BP Readings from Last 3 Encounters:  05/15/16 138/60  01/29/16 160/60  12/19/15 139/82    Wt Readings from Last 3 Encounters:  05/15/16 238 lb (107.956 kg)  01/29/16 240 lb (108.863 kg)  12/19/15 238 lb (107.956 kg)    Physical Exam  Constitutional: She appears well-developed. No distress.  HENT:  Head: Normocephalic.  Right Ear: External ear normal.  Left Ear: External ear normal.  Nose: Nose normal.  Mouth/Throat: Oropharynx is clear and moist.  Eyes: Conjunctivae are normal. Pupils are equal, round, and reactive to light. Right eye exhibits no discharge. Left eye exhibits no  discharge.  Neck: Normal range of motion. Neck supple. No JVD present. No tracheal deviation present. No thyromegaly present.  Cardiovascular: Normal rate, regular rhythm and normal heart sounds.   Pulmonary/Chest: No stridor. No respiratory distress. She has no wheezes.  Abdominal: Soft. Bowel sounds are normal. She exhibits no distension and no mass. There is no tenderness. There is no rebound and no guarding.  Musculoskeletal: She exhibits tenderness. She exhibits no edema.  Lymphadenopathy:    She has no cervical adenopathy.  Neurological: She displays normal  reflexes. No cranial nerve deficit. She exhibits normal muscle tone. Coordination normal.  Skin: No rash noted. No erythema.  Psychiatric: She has a normal mood and affect. Her behavior is normal. Judgment and thought content normal.   Obese Str leg elev is (-) Lab Results  Component Value Date   WBC 8.1 12/16/2015   HGB 13.6 12/16/2015   HCT 41.8 12/16/2015   PLT 260.0 12/16/2015   GLUCOSE 110* 12/16/2015   CHOL 142 12/16/2015   TRIG 127.0 12/16/2015   HDL 30.80* 12/16/2015   LDLDIRECT 127.9 07/14/2011   LDLCALC 86 12/16/2015   ALT 20 12/16/2015   AST 19 12/16/2015   NA 140 12/16/2015   K 4.0 12/16/2015   CL 105 12/16/2015   CREATININE 1.44* 12/16/2015   BUN 25* 12/16/2015   CO2 26 12/16/2015   TSH 2.65 12/16/2015   INR 0.98 06/07/2014   HGBA1C 6.2 12/16/2015    Dg Chest 2 View  06/07/2014  CLINICAL DATA:  Left-sided chest pain without shortness of breath EXAM: CHEST  2 VIEW COMPARISON:  PA and lateral chest x-ray of May 28, 2011 FINDINGS: The lungs are adequately inflated. There is no focal infiltrate. The cardiac silhouette is enlarged. The central pulmonary vascularity is prominent. There is no pleural effusion or pneumothorax. The bony thorax is unremarkable. IMPRESSION: Mild pulmonary vascular prominence with cardiomegaly is consistent with low-grade CHF. There is no pulmonary edema or pneumonia. Electronically Signed   By: David  Martinique   On: 06/07/2014 15:12   Ct Angio Chest Pe W/cm &/or Wo Cm  06/08/2014  CLINICAL DATA:  Left lateral chest pain.  Shortness of breath. EXAM: CT ANGIOGRAPHY CHEST WITH CONTRAST TECHNIQUE: Multidetector CT imaging of the chest was performed using the standard protocol during bolus administration of intravenous contrast. Multiplanar CT image reconstructions and MIPs were obtained to evaluate the vascular anatomy. CONTRAST:  30mL OMNIPAQUE IOHEXOL 350 MG/ML SOLN COMPARISON:  06/07/2014 FINDINGS: Body habitus reduces diagnostic sensitivity and  specificity. Despite efforts by the technologist and patient, motion artifact is present on today's exam and could not be eliminated. This reduces exam sensitivity and specificity. No filling defect is identified in the pulmonary arterial tree to suggest pulmonary embolus. 10 mm left AP window lymph node. Mild cardiomegaly. The lungs appear clear. Mild thoracic spondylosis. Review of the MIP images confirms the above findings. IMPRESSION: 1. No embolus identified. Reduced negative predictive value due to body habitus and breathing motion artifact. 2. Borderline enlarged AP window lymph node, significance uncertain. 3. Mild thoracic spondylosis. 4. Mild cardiomegaly. Electronically Signed   By: Sherryl Barters M.D.   On: 06/08/2014 18:43   Nm Myocar Multi W/spect W/wall Motion / Ef  06/08/2014  CLINICAL DATA:  75 year old with current history of hypertension, diabetes and hyperlipidemia, presenting with chest pain. EXAM: MYOCARDIAL IMAGING WITH SPECT (REST AND PHARMACOLOGIC-STRESS) GATED LEFT VENTRICULAR WALL MOTION STUDY LEFT VENTRICULAR EJECTION FRACTION TECHNIQUE: Standard myocardial SPECT imaging was performed after resting intravenous injection  of 10 mCi Tc-32m sestamibi. Subsequently, intravenous infusion of Lexiscan was performed under the supervision of the Cardiology staff. At peak effect of the drug, 30 mCi Tc-1m sestamibi was injected intravenously and standard myocardial SPECT imaging was performed. Quantitative gated imaging was also performed to evaluate left ventricular wall motion, and estimate left ventricular ejection fraction. COMPARISON:  None. FINDINGS: Immediate post regadenoson images demonstrate no significant focal perfusion defects. Initial resting images demonstrate similar findings. No evidence of reversibility to suggest ischemia. Findings confirmed by the computer generated polar map. Gated images demonstrate satisfactory thickening throughout the left ventricular myocardium with  normal wall motion throughout. Estimated QGS left ventricular ejection fraction measured 81%, with an end-diastolic volume of 63 ml and an end systolic volume of 12 ml. IMPRESSION: 1. No evidence of myocardial ischemia or infarction. 2. Normal left ventricular wall motion. 3. Estimated QGS ejection fraction overestimated at 81%, possibly related to left ventricular hypertrophy. Electronically Signed   By: Evangeline Dakin M.D.   On: 06/08/2014 14:37    Assessment & Plan:   Indica was seen today for back pain.  Diagnoses and all orders for this visit:  LBP radiating to right leg   I am having Ms. Spath maintain her vitamin B-12, Multiple Vitamins-Minerals (CENTRUM SILVER PO), vitamin C, FISH OIL + D3, Polyethyl Glycol-Propyl Glycol (SYSTANE FREE OP), nitroGLYCERIN, acetaminophen, Vitamin D3, carvedilol, atorvastatin, furosemide, Febuxostat, potassium chloride SA, CoQ10, and hydrALAZINE.  No orders of the defined types were placed in this encounter.     Follow-up: No Follow-up on file.  Walker Kehr, MD

## 2016-05-15 NOTE — Assessment & Plan Note (Signed)
Hydralazine, Coreg, Furosemide

## 2016-05-15 NOTE — Assessment & Plan Note (Addendum)
6/17 x 10 d w/mild schiatica - MSK vs other T#3 prn Tizanidine prn Stretch  Potential benefits of opioids use as well as potential risks (i.e. addiction risk, apnea etc) and complications (i.e. Somnolence, constipation and others) were explained to the patient and were aknowledged.

## 2016-05-15 NOTE — Assessment & Plan Note (Signed)
  On diet  

## 2016-05-28 ENCOUNTER — Ambulatory Visit (INDEPENDENT_AMBULATORY_CARE_PROVIDER_SITE_OTHER): Payer: Commercial Managed Care - HMO | Admitting: Family

## 2016-05-28 ENCOUNTER — Encounter: Payer: Self-pay | Admitting: Family

## 2016-05-28 VITALS — BP 136/58 | HR 56 | Temp 98.4°F | Ht 63.0 in | Wt 233.8 lb

## 2016-05-28 DIAGNOSIS — M545 Low back pain, unspecified: Secondary | ICD-10-CM

## 2016-05-28 DIAGNOSIS — M79604 Pain in right leg: Secondary | ICD-10-CM

## 2016-05-28 DIAGNOSIS — M25561 Pain in right knee: Secondary | ICD-10-CM

## 2016-05-28 MED ORDER — DICLOFENAC SODIUM 1 % TD GEL: 4.0000 g | Freq: Four times a day (QID) | TRANSDERMAL | Status: DC

## 2016-05-28 NOTE — Progress Notes (Signed)
Subjective:    Patient ID: Bridget Oliver, female    DOB: Jan 04, 1941, 75 y.o.   MRN: [id number]   Bridget Oliver is a 75 y.o. female who presents today for an acute visit.    HPI Comments: Patient here for evaluation of right knee pain after a fall. Stinging anterior knee pain has persistent since patient fell on it. Occasionally pain radiates to right shin. Had a fall last week when trying to step up on one step and knee buckled. Later that same day, knee buckled again. Didn't hit head or LOC. Some numbness in RLE. Knee pain is worse in morning. Notes similar left knee pain after fall years ago which resolved on its own.  Believes LBP is contributing. Has right sided numbness, radiating pain from low back, groin, thigh which extends to shin. No right hip pain. Seen 6/23 and started on Tyelonol #3 with some relief. Hasnt been taking the Xanaflex.h/o low back surgery for sciatica which resolved back pain completely for many years until now.    Denies exertional chest pain or pressure, numbness or tingling radiating to left arm or jaw, palpitations, dizziness, frequent headaches, changes in vision, or shortness of breath.     Past Medical History  Diagnosis Date  . Hypertension   . Hyperlipidemia   . Hyperglycemia   . Swelling of lower limb   . Obesity   . CRI (chronic renal insufficiency)     Stage 3  . Lactose intolerance   . Diarrhea   . Gout   . Macular degeneration   . Diabetes mellitus 2011    Type II diet controlled   Allergies: Oxycodone Current Outpatient Prescriptions on File Prior to Visit  Medication Sig Dispense Refill  . acetaminophen (TYLENOL) 325 MG tablet Take 2 tablets (650 mg total) by mouth every 6 (six) hours as needed for moderate pain. 30 tablet 0  . acetaminophen-codeine (TYLENOL #3) 300-30 MG tablet Take 1 tablet by mouth every 8 (eight) hours as needed for moderate pain or severe pain. 30 tablet 1  . atorvastatin (LIPITOR) 40 MG tablet Take 1 tablet (40 mg total)  by mouth daily. 90 tablet 3  . carvedilol (COREG) 25 MG tablet TAKE 1 TABLET TWICE DAILY WITH A MEAL 180 tablet 3  . Cholecalciferol (VITAMIN D3) 2000 UNITS capsule Take 1 capsule (2,000 Units total) by mouth daily. 100 capsule 3  . Coenzyme Q10 (COQ10) 100 MG CAPS Take by mouth.    . Febuxostat (ULORIC) 80 MG TABS Take 1 tablet (80 mg total) by mouth daily. 90 tablet 3  . Fish Oil-Cholecalciferol (FISH OIL + D3) 1200-1000 MG-UNIT CAPS Take 1 tablet by mouth 2 (two) times daily.     . furosemide (LASIX) 80 MG tablet Take 1 tablet (80 mg total) by mouth daily. 90 tablet 3  . hydrALAZINE (APRESOLINE) 25 MG tablet TAKE 1 TABLET (25 MG TOTAL) BY MOUTH 3 (THREE) TIMES DAILY. 270 tablet 1  . Multiple Vitamins-Minerals (CENTRUM SILVER PO) Take 1 tablet by mouth daily.     Vladimir Faster Glycol-Propyl Glycol (SYSTANE FREE OP) Place 1 drop into both eyes daily as needed.    . potassium chloride SA (K-DUR,KLOR-CON) 20 MEQ tablet Take 1 tablet (20 mEq total) by mouth daily. 90 tablet 3  . tiZANidine (ZANAFLEX) 4 MG tablet Take 1 tablet (4 mg total) by mouth every 8 (eight) hours as needed for muscle spasms. 30 tablet 0  . vitamin B-12 (CYANOCOBALAMIN) 1000 MCG tablet Take  500 mcg by mouth every other day.     . vitamin C (ASCORBIC ACID) 500 MG tablet Take 500 mg by mouth daily as needed (for cold).     . nitroGLYCERIN (NITROSTAT) 0.4 MG SL tablet Place 0.4 mg under the tongue every 5 (five) minutes as needed for chest pain. Reported on 05/28/2016     No current facility-administered medications on file prior to visit.    Social History  Substance Use Topics  . Smoking status: Never Smoker   . Smokeless tobacco: Never Used  . Alcohol Use: No    Review of Systems  Constitutional: Negative for fever and chills.  Respiratory: Negative for cough.   Cardiovascular: Negative for chest pain and palpitations.  Gastrointestinal: Negative for nausea and vomiting.  Musculoskeletal: Positive for back pain and  joint swelling.  Neurological: Positive for numbness.      Objective:    BP 136/58 mmHg  Pulse 56  Temp(Src) 98.4 F (36.9 C) (Oral)  Ht 5\' 3"  (1.6 m)  Wt 233 lb 12 oz (106.028 kg)  BMI 41.42 kg/m2  SpO2 95%   Physical Exam  Constitutional: She appears well-developed and well-nourished.  Eyes: Conjunctivae are normal.  Cardiovascular: Normal rate, regular rhythm, normal heart sounds and normal pulses.   Pulmonary/Chest: Effort normal and breath sounds normal. She has no wheezes. She has no rhonchi. She has no rales.  Musculoskeletal:       Right knee: She exhibits normal range of motion, no swelling, no effusion, no ecchymosis and no erythema. No tenderness found.       Lumbar back: She exhibits normal range of motion, no tenderness, no bony tenderness, no swelling, no edema, no pain and no spasm.  No pain, numbness, tingling elicited with single leg raise bilaterally.   Bilateral knees are symmetric. No effusion appreciated. No increase in warmth or erythema. Crepitus felt with flexion of bilateral knees.  Right knee:  No catching with McMurray maneuver. Full ROM. Negative anterior drawer and lachman's- no laxity appreciated.   Neurological: She is alert. She has normal strength. No sensory deficit.  Sensation and strength intact bilateral lower extremities.  Skin: Skin is warm and dry.  Psychiatric: She has a normal mood and affect. Her speech is normal and behavior is normal. Thought content normal.  Vitals reviewed.      Assessment & Plan:   1. LBP radiating to right leg Stable.   2. Right knee pain No effusion. Full ROM. Suspect acute on chronic meniscal injury with giving way sensation. Trial of conservative treatment. Wrapped knee in ACE wrap. Declined referral to orthopedics and imaging at this time.   - diclofenac sodium (VOLTAREN) 1 % GEL; Apply 4 g topically 4 (four) times daily.  Dispense: 1 Tube; Refill: 3    I am having Ms. Munley maintain her vitamin  B-12, Multiple Vitamins-Minerals (CENTRUM SILVER PO), vitamin C, FISH OIL + D3, Polyethyl Glycol-Propyl Glycol (SYSTANE FREE OP), nitroGLYCERIN, acetaminophen, Vitamin D3, carvedilol, atorvastatin, furosemide, Febuxostat, potassium chloride SA, CoQ10, hydrALAZINE, tiZANidine, and acetaminophen-codeine.   No orders of the defined types were placed in this encounter.     Start medications as prescribed and explained to patient on After Visit Summary ( AVS). Risks, benefits, and alternatives of the medications and treatment plan prescribed today were discussed, and patient expressed understanding.   Education regarding symptom management and diagnosis given to patient.   Follow-up:Plan follow-up and return precautions given if any worsening symptoms or change in condition.  Continue to follow with Walker Kehr, MD for routine health maintenance.   Leola Brazil and I agreed with plan.   Mable Paris, FNP

## 2016-05-28 NOTE — Patient Instructions (Signed)
Let's treat conservatively as we discussed.   Over-the-counter medications you may try for arthritic pain include:   ThermaCare patches   Capsaicin cream   Icy hot  Home exercises as below.   If conservative treatment doesn't yield results, we will consider physical therapy, consult to Sports Therapy, and imaging.   If there is no improvement in your symptoms, or if there is any worsening of symptoms, or if you have any additional concerns, please return for re-evaluation; or, if we are closed, consider going to the Emergency Room for evaluation if symptoms urgent.  Low Back Sprain With Rehab A sprain is an injury in which a ligament is torn. The ligaments of the lower back are vulnerable to sprains. However, they are strong and require great force to be injured. These ligaments are important for stabilizing the spinal column. Sprains are classified into three categories. Grade 1 sprains cause pain, but the tendon is not lengthened. Grade 2 sprains include a lengthened ligament, due to the ligament being stretched or partially ruptured. With grade 2 sprains there is still function, although the function may be decreased. Grade 3 sprains involve a complete tear of the tendon or muscle, and function is usually impaired. SYMPTOMS   Severe pain in the lower back.  Sometimes, a feeling of a "pop," "snap," or tear, at the time of injury.  Tenderness and sometimes swelling at the injury site.  Uncommonly, bruising (contusion) within 48 hours of injury.  Muscle spasms in the back. CAUSES  Low back sprains occur when a force is placed on the ligaments that is greater than they can handle. Common causes of injury include:  Performing a stressful act while off-balance.  Repetitive stressful activities that involve movement of the lower back.  Direct hit (trauma) to the lower back. RISK INCREASES WITH:  Contact sports (football, wrestling).  Collisions (major skiing accidents).  Sports  that require throwing or lifting (baseball, weightlifting).  Sports involving twisting of the spine (gymnastics, diving, tennis, golf).  Poor strength and flexibility.  Inadequate protection.  Previous back injury or surgery (especially fusion). PREVENTION  Wear properly fitted and padded protective equipment.  Warm up and stretch properly before activity.  Allow for adequate recovery between workouts.  Maintain physical fitness:  Strength, flexibility, and endurance.  Cardiovascular fitness.  Maintain a healthy body weight. PROGNOSIS  If treated properly, low back sprains usually heal with non-surgical treatment. The length of time for healing depends on the severity of the injury.  RELATED COMPLICATIONS   Recurring symptoms, resulting in a chronic problem.  Chronic inflammation and pain in the low back.  Delayed healing or resolution of symptoms, especially if activity is resumed too soon.  Prolonged impairment.  Unstable or arthritic joints of the low back. TREATMENT  Treatment first involves the use of ice and medicine, to reduce pain and inflammation. The use of strengthening and stretching exercises may help reduce pain with activity. These exercises may be performed at home or with a therapist. Severe injuries may require referral to a therapist for further evaluation and treatment, such as ultrasound. Your caregiver may advise that you wear a back brace or corset, to help reduce pain and discomfort. Often, prolonged bed rest results in greater harm then benefit. Corticosteroid injections may be recommended. However, these should be reserved for the most serious cases. It is important to avoid using your back when lifting objects. At night, sleep on your back on a firm mattress, with a pillow placed under your  knees. If non-surgical treatment is unsuccessful, surgery may be needed.  MEDICATION   If pain medicine is needed, nonsteroidal anti-inflammatory medicines  (aspirin and ibuprofen), or other minor pain relievers (acetaminophen), are often advised.  Do not take pain medicine for 7 days before surgery.  Prescription pain relievers may be given, if your caregiver thinks they are needed. Use only as directed and only as much as you need.  Ointments applied to the skin may be helpful.  Corticosteroid injections may be given by your caregiver. These injections should be reserved for the most serious cases, because they may only be given a certain number of times. HEAT AND COLD  Cold treatment (icing) should be applied for 10 to 15 minutes every 2 to 3 hours for inflammation and pain, and immediately after activity that aggravates your symptoms. Use ice packs or an ice massage.  Heat treatment may be used before performing stretching and strengthening activities prescribed by your caregiver, physical therapist, or athletic trainer. Use a heat pack or a warm water soak. SEEK MEDICAL CARE IF:   Symptoms get worse or do not improve in 2 to 4 weeks, despite treatment.  You develop numbness or weakness in either leg.  You lose bowel or bladder function.  Any of the following occur after surgery: fever, increased pain, swelling, redness, drainage of fluids, or bleeding in the affected area.  New, unexplained symptoms develop. (Drugs used in treatment may produce side effects.) EXERCISES  RANGE OF MOTION (ROM) AND STRETCHING EXERCISES - Low Back Sprain Most people with lower back pain will find that their symptoms get worse with excessive bending forward (flexion) or arching at the lower back (extension). The exercises that will help resolve your symptoms will focus on the opposite motion.  Your physician, physical therapist or athletic trainer will help you determine which exercises will be most helpful to resolve your lower back pain. Do not complete any exercises without first consulting with your caregiver. Discontinue any exercises which make your  symptoms worse, until you speak to your caregiver. If you have pain, numbness or tingling which travels down into your buttocks, leg or foot, the goal of the therapy is for these symptoms to move closer to your back and eventually resolve. Sometimes, these leg symptoms will get better, but your lower back pain may worsen. This is often an indication of progress in your rehabilitation. Be very alert to any changes in your symptoms and the activities in which you participated in the 24 hours prior to the change. Sharing this information with your caregiver will allow him or her to most efficiently treat your condition. These exercises may help you when beginning to rehabilitate your injury. Your symptoms may resolve with or without further involvement from your physician, physical therapist or athletic trainer. While completing these exercises, remember:   Restoring tissue flexibility helps normal motion to return to the joints. This allows healthier, less painful movement and activity.  An effective stretch should be held for at least 30 seconds.  A stretch should never be painful. You should only feel a gentle lengthening or release in the stretched tissue. FLEXION RANGE OF MOTION AND STRETCHING EXERCISES: STRETCH - Flexion, Single Knee to Chest   Lie on a firm bed or floor with both legs extended in front of you.  Keeping one leg in contact with the floor, bring your opposite knee to your chest. Hold your leg in place by either grabbing behind your thigh or at your knee.  Pull until you feel a gentle stretch in your low back. Hold __________ seconds.  Slowly release your grasp and repeat the exercise with the opposite side. Repeat __________ times. Complete this exercise __________ times per day.  STRETCH - Flexion, Double Knee to Chest  Lie on a firm bed or floor with both legs extended in front of you.  Keeping one leg in contact with the floor, bring your opposite knee to your  chest.  Tense your stomach muscles to support your back and then lift your other knee to your chest. Hold your legs in place by either grabbing behind your thighs or at your knees.  Pull both knees toward your chest until you feel a gentle stretch in your low back. Hold __________ seconds.  Tense your stomach muscles and slowly return one leg at a time to the floor. Repeat __________ times. Complete this exercise __________ times per day.  STRETCH - Low Trunk Rotation  Lie on a firm bed or floor. Keeping your legs in front of you, bend your knees so they are both pointed toward the ceiling and your feet are flat on the floor.  Extend your arms out to the side. This will stabilize your upper body by keeping your shoulders in contact with the floor.  Gently and slowly drop both knees together to one side until you feel a gentle stretch in your low back. Hold for __________ seconds.  Tense your stomach muscles to support your lower back as you bring your knees back to the starting position. Repeat the exercise to the other side. Repeat __________ times. Complete this exercise __________ times per day  EXTENSION RANGE OF MOTION AND FLEXIBILITY EXERCISES: STRETCH - Extension, Prone on Elbows   Lie on your stomach on the floor, a bed will be too soft. Place your palms about shoulder width apart and at the height of your head.  Place your elbows under your shoulders. If this is too painful, stack pillows under your chest.  Allow your body to relax so that your hips drop lower and make contact more completely with the floor.  Hold this position for __________ seconds.  Slowly return to lying flat on the floor. Repeat __________ times. Complete this exercise __________ times per day.  RANGE OF MOTION - Extension, Prone Press Ups  Lie on your stomach on the floor, a bed will be too soft. Place your palms about shoulder width apart and at the height of your head.  Keeping your back as relaxed  as possible, slowly straighten your elbows while keeping your hips on the floor. You may adjust the placement of your hands to maximize your comfort. As you gain motion, your hands will come more underneath your shoulders.  Hold this position __________ seconds.  Slowly return to lying flat on the floor. Repeat __________ times. Complete this exercise __________ times per day.  RANGE OF MOTION- Quadruped, Neutral Spine   Assume a hands and knees position on a firm surface. Keep your hands under your shoulders and your knees under your hips. You may place padding under your knees for comfort.  Drop your head and point your tailbone toward the ground below you. This will round out your lower back like an angry cat. Hold this position for __________ seconds.  Slowly lift your head and release your tail bone so that your back sags into a large arch, like an old horse.  Hold this position for __________ seconds.  Repeat this until you feel  limber in your low back.  Now, find your "sweet spot." This will be the most comfortable position somewhere between the two previous positions. This is your neutral spine. Once you have found this position, tense your stomach muscles to support your low back.  Hold this position for __________ seconds. Repeat __________ times. Complete this exercise __________ times per day.  STRENGTHENING EXERCISES - Low Back Sprain These exercises may help you when beginning to rehabilitate your injury. These exercises should be done near your "sweet spot." This is the neutral, low-back arch, somewhere between fully rounded and fully arched, that is your least painful position. When performed in this safe range of motion, these exercises can be used for people who have either a flexion or extension based injury. These exercises may resolve your symptoms with or without further involvement from your physician, physical therapist or athletic trainer. While completing these  exercises, remember:   Muscles can gain both the endurance and the strength needed for everyday activities through controlled exercises.  Complete these exercises as instructed by your physician, physical therapist or athletic trainer. Increase the resistance and repetitions only as guided.  You may experience muscle soreness or fatigue, but the pain or discomfort you are trying to eliminate should never worsen during these exercises. If this pain does worsen, stop and make certain you are following the directions exactly. If the pain is still present after adjustments, discontinue the exercise until you can discuss the trouble with your caregiver. STRENGTHENING - Deep Abdominals, Pelvic Tilt   Lie on a firm bed or floor. Keeping your legs in front of you, bend your knees so they are both pointed toward the ceiling and your feet are flat on the floor.  Tense your lower abdominal muscles to press your low back into the floor. This motion will rotate your pelvis so that your tail bone is scooping upwards rather than pointing at your feet or into the floor. With a gentle tension and even breathing, hold this position for __________ seconds. Repeat __________ times. Complete this exercise __________ times per day.  STRENGTHENING - Abdominals, Crunches   Lie on a firm bed or floor. Keeping your legs in front of you, bend your knees so they are both pointed toward the ceiling and your feet are flat on the floor. Cross your arms over your chest.  Slightly tip your chin down without bending your neck.  Tense your abdominals and slowly lift your trunk high enough to just clear your shoulder blades. Lifting higher can put excessive stress on the lower back and does not further strengthen your abdominal muscles.  Control your return to the starting position. Repeat __________ times. Complete this exercise __________ times per day.  STRENGTHENING - Quadruped, Opposite UE/LE Lift   Assume a hands and  knees position on a firm surface. Keep your hands under your shoulders and your knees under your hips. You may place padding under your knees for comfort.  Find your neutral spine and gently tense your abdominal muscles so that you can maintain this position. Your shoulders and hips should form a rectangle that is parallel with the floor and is not twisted.  Keeping your trunk steady, lift your right hand no higher than your shoulder and then your left leg no higher than your hip. Make sure you are not holding your breath. Hold this position for __________ seconds.  Continuing to keep your abdominal muscles tense and your back steady, slowly return to your starting position. Repeat  with the opposite arm and leg. Repeat __________ times. Complete this exercise __________ times per day.  STRENGTHENING - Abdominals and Quadriceps, Straight Leg Raise   Lie on a firm bed or floor with both legs extended in front of you.  Keeping one leg in contact with the floor, bend the other knee so that your foot can rest flat on the floor.  Find your neutral spine, and tense your abdominal muscles to maintain your spinal position throughout the exercise.  Slowly lift your straight leg off the floor about 6 inches for a count of 15, making sure to not hold your breath.  Still keeping your neutral spine, slowly lower your leg all the way to the floor. Repeat this exercise with each leg __________ times. Complete this exercise __________ times per day. POSTURE AND BODY MECHANICS CONSIDERATIONS - Low Back Sprain Keeping correct posture when sitting, standing or completing your activities will reduce the stress put on different body tissues, allowing injured tissues a chance to heal and limiting painful experiences. The following are general guidelines for improved posture. Your physician or physical therapist will provide you with any instructions specific to your needs. While reading these guidelines,  remember:  The exercises prescribed by your provider will help you have the flexibility and strength to maintain correct postures.  The correct posture provides the best environment for your joints to work. All of your joints have less wear and tear when properly supported by a spine with good posture. This means you will experience a healthier, less painful body.  Correct posture must be practiced with all of your activities, especially prolonged sitting and standing. Correct posture is as important when doing repetitive low-stress activities (typing) as it is when doing a single heavy-load activity (lifting). RESTING POSITIONS Consider which positions are most painful for you when choosing a resting position. If you have pain with flexion-based activities (sitting, bending, stooping, squatting), choose a position that allows you to rest in a less flexed posture. You would want to avoid curling into a fetal position on your side. If your pain worsens with extension-based activities (prolonged standing, working overhead), avoid resting in an extended position such as sleeping on your stomach. Most people will find more comfort when they rest with their spine in a more neutral position, neither too rounded nor too arched. Lying on a non-sagging bed on your side with a pillow between your knees, or on your back with a pillow under your knees will often provide some relief. Keep in mind, being in any one position for a prolonged period of time, no matter how correct your posture, can still lead to stiffness. PROPER SITTING POSTURE In order to minimize stress and discomfort on your spine, you must sit with correct posture. Sitting with good posture should be effortless for a healthy body. Returning to good posture is a gradual process. Many people can work toward this most comfortably by using various supports until they have the flexibility and strength to maintain this posture on their own. When sitting  with proper posture, your ears will fall over your shoulders and your shoulders will fall over your hips. You should use the back of the chair to support your upper back. Your lower back will be in a neutral position, just slightly arched. You may place a small pillow or folded towel at the base of your lower back for  support.  When working at a desk, create an environment that supports good, upright posture. Without extra  support, muscles tire, which leads to excessive strain on joints and other tissues. Keep these recommendations in mind: CHAIR:  A chair should be able to slide under your desk when your back makes contact with the back of the chair. This allows you to work closely.  The chair's height should allow your eyes to be level with the upper part of your monitor and your hands to be slightly lower than your elbows. BODY POSITION  Your feet should make contact with the floor. If this is not possible, use a foot rest.  Keep your ears over your shoulders. This will reduce stress on your neck and low back. INCORRECT SITTING POSTURES  If you are feeling tired and unable to assume a healthy sitting posture, do not slouch or slump. This puts excessive strain on your back tissues, causing more damage and pain. Healthier options include:  Using more support, like a lumbar pillow.  Switching tasks to something that requires you to be upright or walking.  Talking a brief walk.  Lying down to rest in a neutral-spine position. PROLONGED STANDING WHILE SLIGHTLY LEANING FORWARD  When completing a task that requires you to lean forward while standing in one place for a long time, place either foot up on a stationary 2-4 inch high object to help maintain the best posture. When both feet are on the ground, the lower back tends to lose its slight inward curve. If this curve flattens (or becomes too large), then the back and your other joints will experience too much stress, tire more quickly, and  can cause pain. CORRECT STANDING POSTURES Proper standing posture should be assumed with all daily activities, even if they only take a few moments, like when brushing your teeth. As in sitting, your ears should fall over your shoulders and your shoulders should fall over your hips. You should keep a slight tension in your abdominal muscles to brace your spine. Your tailbone should point down to the ground, not behind your body, resulting in an over-extended swayback posture.  INCORRECT STANDING POSTURES  Common incorrect standing postures include a forward head, locked knees and/or an excessive swayback. WALKING Walk with an upright posture. Your ears, shoulders and hips should all line-up. PROLONGED ACTIVITY IN A FLEXED POSITION When completing a task that requires you to bend forward at your waist or lean over a low surface, try to find a way to stabilize 3 out of 4 of your limbs. You can place a hand or elbow on your thigh or rest a knee on the surface you are reaching across. This will provide you more stability, so that your muscles do not tire as quickly. By keeping your knees relaxed, or slightly bent, you will also reduce stress across your lower back. CORRECT LIFTING TECHNIQUES DO :  Assume a wide stance. This will provide you more stability and the opportunity to get as close as possible to the object which you are lifting.  Tense your abdominals to brace your spine. Bend at the knees and hips. Keeping your back locked in a neutral-spine position, lift using your leg muscles. Lift with your legs, keeping your back straight.  Test the weight of unknown objects before attempting to lift them.  Try to keep your elbows locked down at your sides in order get the best strength from your shoulders when carrying an object.  Always ask for help when lifting heavy or awkward objects. INCORRECT LIFTING TECHNIQUES DO NOT:   Lock your knees when lifting,  even if it is a small object.  Bend  and twist. Pivot at your feet or move your feet when needing to change directions.  Assume that you can safely pick up even a paperclip without proper posture.   This information is not intended to replace advice given to you by your health care provider. Make sure you discuss any questions you have with your health care provider.   Document Released: 11/09/2005 Document Revised: 11/30/2014 Document Reviewed: 02/21/2009 [employer] Interactive Patient Education [employer].

## 2016-05-28 NOTE — Progress Notes (Signed)
Pre visit review using our clinic review tool, if applicable. No additional management support is needed unless otherwise documented below in the visit note. 

## 2016-06-17 ENCOUNTER — Other Ambulatory Visit (INDEPENDENT_AMBULATORY_CARE_PROVIDER_SITE_OTHER): Payer: Commercial Managed Care - HMO

## 2016-06-17 ENCOUNTER — Ambulatory Visit (INDEPENDENT_AMBULATORY_CARE_PROVIDER_SITE_OTHER): Payer: Commercial Managed Care - HMO | Admitting: Internal Medicine

## 2016-06-17 ENCOUNTER — Encounter: Payer: Self-pay | Admitting: Internal Medicine

## 2016-06-17 VITALS — BP 139/82 | HR 67 | Wt 238.0 lb

## 2016-06-17 DIAGNOSIS — E1122 Type 2 diabetes mellitus with diabetic chronic kidney disease: Secondary | ICD-10-CM

## 2016-06-17 DIAGNOSIS — I6523 Occlusion and stenosis of bilateral carotid arteries: Secondary | ICD-10-CM

## 2016-06-17 DIAGNOSIS — N183 Chronic kidney disease, stage 3 unspecified: Secondary | ICD-10-CM

## 2016-06-17 DIAGNOSIS — E538 Deficiency of other specified B group vitamins: Secondary | ICD-10-CM

## 2016-06-17 DIAGNOSIS — I119 Hypertensive heart disease without heart failure: Secondary | ICD-10-CM

## 2016-06-17 DIAGNOSIS — E785 Hyperlipidemia, unspecified: Secondary | ICD-10-CM

## 2016-06-17 LAB — BASIC METABOLIC PANEL
BUN: 26 mg/dL — ABNORMAL HIGH (ref 6–23)
CHLORIDE: 105 meq/L (ref 96–112)
CO2: 28 mEq/L (ref 19–32)
Calcium: 9.5 mg/dL (ref 8.4–10.5)
Creatinine, Ser: 1.52 mg/dL — ABNORMAL HIGH (ref 0.40–1.20)
GFR: 42.98 mL/min — AB (ref >60.00)
GLUCOSE: 109 mg/dL — AB (ref 70–99)
POTASSIUM: 4.2 meq/L (ref 3.5–5.1)
SODIUM: 140 meq/L (ref 135–145)

## 2016-06-17 LAB — HEMOGLOBIN A1C: Hgb A1c MFr Bld: 6.2 % (ref 4.6–6.5)

## 2016-06-17 NOTE — Progress Notes (Signed)
Subjective:  Patient ID: Bridget Oliver, female    DOB: June 12, 1941  Age: 75 y.o. MRN: [id number]  CC: No chief complaint on file.   HPI Bridget Oliver presents for HTN, CRF, obesity f/u  Outpatient Medications Prior to Visit  Medication Sig Dispense Refill  . acetaminophen (TYLENOL) 325 MG tablet Take 2 tablets (650 mg total) by mouth every 6 (six) hours as needed for moderate pain. 30 tablet 0  . acetaminophen-codeine (TYLENOL #3) 300-30 MG tablet Take 1 tablet by mouth every 8 (eight) hours as needed for moderate pain or severe pain. 30 tablet 1  . atorvastatin (LIPITOR) 40 MG tablet Take 1 tablet (40 mg total) by mouth daily. 90 tablet 3  . carvedilol (COREG) 25 MG tablet TAKE 1 TABLET TWICE DAILY WITH A MEAL 180 tablet 3  . Cholecalciferol (VITAMIN D3) 2000 UNITS capsule Take 1 capsule (2,000 Units total) by mouth daily. 100 capsule 3  . Coenzyme Q10 (COQ10) 100 MG CAPS Take by mouth.    . diclofenac sodium (VOLTAREN) 1 % GEL Apply 4 g topically 4 (four) times daily. 1 Tube 3  . Febuxostat (ULORIC) 80 MG TABS Take 1 tablet (80 mg total) by mouth daily. 90 tablet 3  . Fish Oil-Cholecalciferol (FISH OIL + D3) 1200-1000 MG-UNIT CAPS Take 1 tablet by mouth 2 (two) times daily.     . furosemide (LASIX) 80 MG tablet Take 1 tablet (80 mg total) by mouth daily. 90 tablet 3  . hydrALAZINE (APRESOLINE) 25 MG tablet TAKE 1 TABLET (25 MG TOTAL) BY MOUTH 3 (THREE) TIMES DAILY. 270 tablet 1  . Multiple Vitamins-Minerals (CENTRUM SILVER PO) Take 1 tablet by mouth daily.     . nitroGLYCERIN (NITROSTAT) 0.4 MG SL tablet Place 0.4 mg under the tongue every 5 (five) minutes as needed for chest pain. Reported on 05/28/2016    . Polyethyl Glycol-Propyl Glycol (SYSTANE FREE OP) Place 1 drop into both eyes daily as needed.    . potassium chloride SA (K-DUR,KLOR-CON) 20 MEQ tablet Take 1 tablet (20 mEq total) by mouth daily. 90 tablet 3  . tiZANidine (ZANAFLEX) 4 MG tablet Take 1 tablet (4 mg total) by mouth every 8  (eight) hours as needed for muscle spasms. 30 tablet 0  . vitamin B-12 (CYANOCOBALAMIN) 1000 MCG tablet Take 500 mcg by mouth every other day.     . vitamin C (ASCORBIC ACID) 500 MG tablet Take 500 mg by mouth daily as needed (for cold).      No facility-administered medications prior to visit.     ROS Review of Systems  Constitutional: Negative for activity change, appetite change, chills, fatigue and unexpected weight change.  HENT: Negative for congestion, mouth sores and sinus pressure.   Eyes: Negative for visual disturbance.  Respiratory: Negative for cough and chest tightness.   Gastrointestinal: Negative for abdominal pain and nausea.  Genitourinary: Negative for difficulty urinating, frequency and vaginal pain.  Musculoskeletal: Negative for back pain and gait problem.  Skin: Negative for pallor and rash.  Neurological: Negative for dizziness, tremors, weakness, numbness and headaches.  Psychiatric/Behavioral: Negative for confusion and sleep disturbance.    Objective:  BP (!) 160/74   Pulse 67   Wt 238 lb (108 kg)   SpO2 98%   BMI 42.16 kg/m   BP Readings from Last 3 Encounters:  06/17/16 (!) 160/74  05/28/16 (!) 136/58  05/15/16 138/60    Wt Readings from Last 3 Encounters:  06/17/16 238 lb (108 kg)  05/28/16 233 lb 12 oz (106 kg)  05/15/16 238 lb (108 kg)    Physical Exam  Constitutional: She appears well-developed. No distress.  HENT:  Head: Normocephalic.  Right Ear: External ear normal.  Left Ear: External ear normal.  Nose: Nose normal.  Mouth/Throat: Oropharynx is clear and moist.  Eyes: Conjunctivae are normal. Pupils are equal, round, and reactive to light. Right eye exhibits no discharge. Left eye exhibits no discharge.  Neck: Normal range of motion. Neck supple. No JVD present. No tracheal deviation present. No thyromegaly present.  Cardiovascular: Normal rate, regular rhythm and normal heart sounds.   Pulmonary/Chest: No stridor. No  respiratory distress. She has no wheezes.  Abdominal: Soft. Bowel sounds are normal. She exhibits no distension and no mass. There is no tenderness. There is no rebound and no guarding.  Musculoskeletal: She exhibits no edema or tenderness.  Lymphadenopathy:    She has no cervical adenopathy.  Neurological: She displays normal reflexes. No cranial nerve deficit. She exhibits normal muscle tone. Coordination normal.  Skin: No rash noted. No erythema.  Psychiatric: She has a normal mood and affect. Her behavior is normal. Judgment and thought content normal.  Obese  Lab Results  Component Value Date   WBC 8.1 12/16/2015   HGB 13.6 12/16/2015   HCT 41.8 12/16/2015   PLT 260.0 12/16/2015   GLUCOSE 110 (H) 12/16/2015   CHOL 142 12/16/2015   TRIG 127.0 12/16/2015   HDL 30.80 (L) 12/16/2015   LDLDIRECT 127.9 07/14/2011   LDLCALC 86 12/16/2015   ALT 20 12/16/2015   AST 19 12/16/2015   NA 140 12/16/2015   K 4.0 12/16/2015   CL 105 12/16/2015   CREATININE 1.44 (H) 12/16/2015   BUN 25 (H) 12/16/2015   CO2 26 12/16/2015   TSH 2.65 12/16/2015   INR 0.98 06/07/2014   HGBA1C 6.2 12/16/2015    Dg Chest 2 View  Result Date: 06/07/2014 CLINICAL DATA:  Left-sided chest pain without shortness of breath EXAM: CHEST  2 VIEW COMPARISON:  PA and lateral chest x-ray of May 28, 2011 FINDINGS: The lungs are adequately inflated. There is no focal infiltrate. The cardiac silhouette is enlarged. The central pulmonary vascularity is prominent. There is no pleural effusion or pneumothorax. The bony thorax is unremarkable. IMPRESSION: Mild pulmonary vascular prominence with cardiomegaly is consistent with low-grade CHF. There is no pulmonary edema or pneumonia. Electronically Signed   By: David  Martinique   On: 06/07/2014 15:12   Ct Angio Chest Pe W/cm &/or Wo Cm  Result Date: 06/08/2014 CLINICAL DATA:  Left lateral chest pain.  Shortness of breath. EXAM: CT ANGIOGRAPHY CHEST WITH CONTRAST TECHNIQUE:  Multidetector CT imaging of the chest was performed using the standard protocol during bolus administration of intravenous contrast. Multiplanar CT image reconstructions and MIPs were obtained to evaluate the vascular anatomy. CONTRAST:  59mL OMNIPAQUE IOHEXOL 350 MG/ML SOLN COMPARISON:  06/07/2014 FINDINGS: Body habitus reduces diagnostic sensitivity and specificity. Despite efforts by the technologist and patient, motion artifact is present on today's exam and could not be eliminated. This reduces exam sensitivity and specificity. No filling defect is identified in the pulmonary arterial tree to suggest pulmonary embolus. 10 mm left AP window lymph node. Mild cardiomegaly. The lungs appear clear. Mild thoracic spondylosis. Review of the MIP images confirms the above findings. IMPRESSION: 1. No embolus identified. Reduced negative predictive value due to body habitus and breathing motion artifact. 2. Borderline enlarged AP window lymph node, significance uncertain. 3. Mild thoracic spondylosis.  4. Mild cardiomegaly. Electronically Signed   By: Sherryl Barters M.D.   On: 06/08/2014 18:43   Nm Myocar Multi W/spect W/wall Motion / Ef  Result Date: 06/08/2014 CLINICAL DATA:  75 year old with current history of hypertension, diabetes and hyperlipidemia, presenting with chest pain. EXAM: MYOCARDIAL IMAGING WITH SPECT (REST AND PHARMACOLOGIC-STRESS) GATED LEFT VENTRICULAR WALL MOTION STUDY LEFT VENTRICULAR EJECTION FRACTION TECHNIQUE: Standard myocardial SPECT imaging was performed after resting intravenous injection of 10 mCi Tc-27m sestamibi. Subsequently, intravenous infusion of Lexiscan was performed under the supervision of the Cardiology staff. At peak effect of the drug, 30 mCi Tc-15m sestamibi was injected intravenously and standard myocardial SPECT imaging was performed. Quantitative gated imaging was also performed to evaluate left ventricular wall motion, and estimate left ventricular ejection fraction.  COMPARISON:  None. FINDINGS: Immediate post regadenoson images demonstrate no significant focal perfusion defects. Initial resting images demonstrate similar findings. No evidence of reversibility to suggest ischemia. Findings confirmed by the computer generated polar map. Gated images demonstrate satisfactory thickening throughout the left ventricular myocardium with normal wall motion throughout. Estimated QGS left ventricular ejection fraction measured 81%, with an end-diastolic volume of 63 ml and an end systolic volume of 12 ml. IMPRESSION: 1. No evidence of myocardial ischemia or infarction. 2. Normal left ventricular wall motion. 3. Estimated QGS ejection fraction overestimated at 81%, possibly related to left ventricular hypertrophy. Electronically Signed   By: Evangeline Dakin M.D.   On: 06/08/2014 14:37    Assessment & Plan:   There are no diagnoses linked to this encounter. I am having Ms. Allis maintain her vitamin B-12, Multiple Vitamins-Minerals (CENTRUM SILVER PO), vitamin C, FISH OIL + D3, Polyethyl Glycol-Propyl Glycol (SYSTANE FREE OP), nitroGLYCERIN, acetaminophen, Vitamin D3, carvedilol, atorvastatin, furosemide, Febuxostat, potassium chloride SA, CoQ10, hydrALAZINE, tiZANidine, acetaminophen-codeine, and diclofenac sodium.  No orders of the defined types were placed in this encounter.    Follow-up: No Follow-up on file.  Walker Kehr, MD

## 2016-06-17 NOTE — Addendum Note (Signed)
Addended by: Cassandria Anger on: 06/17/2016 09:18 AM   Modules accepted: Orders

## 2016-06-17 NOTE — Assessment & Plan Note (Signed)
Wt Readings from Last 3 Encounters:  06/17/16 238 lb (108 kg)  05/28/16 233 lb 12 oz (106 kg)  05/15/16 238 lb (108 kg)

## 2016-06-17 NOTE — Assessment & Plan Note (Signed)
Coreg, Hydralazine, Furosemide F/u w/Dr Junita Push

## 2016-06-17 NOTE — Assessment & Plan Note (Signed)
On diet Labs 

## 2016-06-17 NOTE — Assessment & Plan Note (Signed)
On B12 

## 2016-06-17 NOTE — Progress Notes (Signed)
Pre visit review using our clinic review tool, if applicable. No additional management support is needed unless otherwise documented below in the visit note. 

## 2016-06-17 NOTE — Assessment & Plan Note (Signed)
Repeat US

## 2016-06-17 NOTE — Assessment & Plan Note (Signed)
On Lipitor 

## 2016-08-04 ENCOUNTER — Ambulatory Visit
Admission: RE | Admit: 2016-08-04 | Discharge: 2016-08-04 | Disposition: A | Discharge status: Home / Self Care | Payer: Commercial Managed Care - HMO | Source: Ambulatory Visit | Attending: Internal Medicine | Admitting: Internal Medicine

## 2016-09-04 ENCOUNTER — Other Ambulatory Visit: Payer: Self-pay | Admitting: Pharmacist

## 2016-09-04 NOTE — Patient Outreach (Signed)
Outreach call to Bridget Oliver regarding her request for follow up from the [facility] Medication Adherence [facility].  Left a HIPAA compliant message on the patient's voicemail.  Harlow Asa, PharmD Clinical Pharmacist [facility] [phone]

## 2016-09-17 ENCOUNTER — Other Ambulatory Visit: Payer: Self-pay | Admitting: Internal Medicine

## 2016-12-14 ENCOUNTER — Telehealth: Payer: Self-pay | Admitting: Internal Medicine

## 2016-12-14 NOTE — Telephone Encounter (Signed)
Ok to ref meds Thx 

## 2016-12-14 NOTE — Telephone Encounter (Signed)
Pt came by office today to update her insurance information to [place] for 2018.  A copy of her card has been scanned into her pt documents.  Pt is requesting refills on all of her prescribed medications.  Pt is coming in for her CPE next Monday.  If you ned to reach pt please try home # first and then cell #.

## 2016-12-15 NOTE — Telephone Encounter (Signed)
I do not know what pharmacy to send refills to and specifically which meds. I called pt. No answer. Left mess for patient to call back.

## 2016-12-16 ENCOUNTER — Other Ambulatory Visit: Payer: Self-pay | Admitting: *Deleted

## 2016-12-16 MED ORDER — FEBUXOSTAT 80 MG PO TABS: 1.0000 | ORAL_TABLET | Freq: Every day | ORAL | 3 refills | Status: DC

## 2016-12-16 MED ORDER — CARVEDILOL 25 MG PO TABS: ORAL_TABLET | ORAL | 3 refills | Status: DC

## 2016-12-16 MED ORDER — ATORVASTATIN CALCIUM 40 MG PO TABS: 40.0000 mg | ORAL_TABLET | Freq: Every day | ORAL | 3 refills | Status: DC

## 2016-12-16 MED ORDER — HYDRALAZINE HCL 25 MG PO TABS: ORAL_TABLET | ORAL | 1 refills | Status: DC

## 2016-12-16 MED ORDER — POTASSIUM CHLORIDE CRYS ER 20 MEQ PO TBCR: 20.0000 meq | EXTENDED_RELEASE_TABLET | Freq: Every day | ORAL | 3 refills | Status: DC

## 2016-12-16 MED ORDER — FUROSEMIDE 80 MG PO TABS: 80.0000 mg | ORAL_TABLET | Freq: Every day | ORAL | 3 refills | Status: DC

## 2016-12-16 NOTE — Telephone Encounter (Signed)
Rec'd call pt states she is wanting to make sure that her medications was sent to her new mail service. Verified chart no rx's has been sent. Pt states now she has to get her meds through [place] home delivery. Inform can send all maintenance meds rx by Dr. Alain Marion. Rx's sent electronically...Bridget Oliver

## 2016-12-16 NOTE — Telephone Encounter (Signed)
Left detailed mess informing pt to call back with more info on what meds need to be refilled and which pharmacy to send them to. I also advised her the bset way to receive refills quickly is to request them through the pharmacy using the rx numbers on the bottle.

## 2016-12-18 ENCOUNTER — Telehealth: Payer: Self-pay | Admitting: *Deleted

## 2016-12-18 MED ORDER — FUROSEMIDE 80 MG PO TABS: 80.0000 mg | ORAL_TABLET | Freq: Every day | ORAL | 0 refills | Status: DC

## 2016-12-18 MED ORDER — FEBUXOSTAT 80 MG PO TABS: 1.0000 | ORAL_TABLET | Freq: Every day | ORAL | 0 refills | Status: DC

## 2016-12-18 MED ORDER — ATORVASTATIN CALCIUM 40 MG PO TABS: 40.0000 mg | ORAL_TABLET | Freq: Every day | ORAL | 0 refills | Status: DC

## 2016-12-18 MED ORDER — CARVEDILOL 25 MG PO TABS: ORAL_TABLET | ORAL | 0 refills | Status: DC

## 2016-12-18 MED ORDER — POTASSIUM CHLORIDE CRYS ER 20 MEQ PO TBCR: 20.0000 meq | EXTENDED_RELEASE_TABLET | Freq: Every day | ORAL | 0 refills | Status: DC

## 2016-12-18 NOTE — Telephone Encounter (Signed)
Rec'd call pt states she will need to get [facility] supply [facility] on her meds until she receive mail order. Verified which [facility].../lmb

## 2016-12-21 ENCOUNTER — Encounter: Payer: Self-pay | Admitting: Internal Medicine

## 2016-12-21 ENCOUNTER — Ambulatory Visit (INDEPENDENT_AMBULATORY_CARE_PROVIDER_SITE_OTHER): Payer: Medicare HMO | Admitting: Internal Medicine

## 2016-12-21 ENCOUNTER — Other Ambulatory Visit (INDEPENDENT_AMBULATORY_CARE_PROVIDER_SITE_OTHER): Payer: Medicare HMO

## 2016-12-21 VITALS — BP 136/72 | HR 58 | Temp 97.6°F | Wt 243.0 lb

## 2016-12-21 DIAGNOSIS — Z0000 Encounter for general adult medical examination without abnormal findings: Secondary | ICD-10-CM

## 2016-12-21 DIAGNOSIS — E1122 Type 2 diabetes mellitus with diabetic chronic kidney disease: Secondary | ICD-10-CM

## 2016-12-21 DIAGNOSIS — Z1211 Encounter for screening for malignant neoplasm of colon: Secondary | ICD-10-CM | POA: Diagnosis not present

## 2016-12-21 DIAGNOSIS — N183 Chronic kidney disease, stage 3 unspecified: Secondary | ICD-10-CM

## 2016-12-21 DIAGNOSIS — I119 Hypertensive heart disease without heart failure: Secondary | ICD-10-CM

## 2016-12-21 DIAGNOSIS — E538 Deficiency of other specified B group vitamins: Secondary | ICD-10-CM | POA: Diagnosis not present

## 2016-12-21 DIAGNOSIS — I6523 Occlusion and stenosis of bilateral carotid arteries: Secondary | ICD-10-CM | POA: Diagnosis not present

## 2016-12-21 LAB — URINALYSIS, ROUTINE W REFLEX MICROSCOPIC
BILIRUBIN URINE: NEGATIVE
Hgb urine dipstick: NEGATIVE
KETONES UR: NEGATIVE
Nitrite: NEGATIVE
PH: 6 (ref 5.0–8.0)
Total Protein, Urine: NEGATIVE
URINE GLUCOSE: NEGATIVE
Urobilinogen, UA: 0.2 (ref 0.0–1.0)

## 2016-12-21 LAB — BASIC METABOLIC PANEL
BUN: 20 mg/dL (ref 6–23)
CALCIUM: 9.2 mg/dL (ref 8.4–10.5)
CHLORIDE: 105 meq/L (ref 96–112)
CO2: 26 mEq/L (ref 19–32)
CREATININE: 1.37 mg/dL — AB (ref 0.40–1.20)
GFR: 48.39 mL/min — ABNORMAL LOW (ref >60.00)
GLUCOSE: 108 mg/dL — AB (ref 70–99)
Potassium: 4.2 mEq/L (ref 3.5–5.1)
Sodium: 139 mEq/L (ref 135–145)

## 2016-12-21 LAB — CBC WITH DIFFERENTIAL/PLATELET
BASOS PCT: 0.6 % (ref 0.0–3.0)
Basophils Absolute: 0 10*3/uL (ref 0.0–0.1)
EOS ABS: 0.1 10*3/uL (ref 0.0–0.7)
Eosinophils Relative: 1.9 % (ref 0.0–5.0)
HEMATOCRIT: 40.6 % (ref 36.0–46.0)
Hemoglobin: 13.4 g/dL (ref 12.0–15.0)
Lymphocytes Relative: 25.9 % (ref 12.0–46.0)
Lymphs Abs: 1.9 10*3/uL (ref 0.7–4.0)
MCHC: 33 g/dL (ref 30.0–36.0)
MCV: 82.1 fl (ref 78.0–100.0)
MONO ABS: 0.8 10*3/uL (ref 0.1–1.0)
Monocytes Relative: 11.2 % (ref 3.0–12.0)
NEUTROS ABS: 4.5 10*3/uL (ref 1.4–7.7)
NEUTROS PCT: 60.4 % (ref 43.0–77.0)
PLATELETS: 250 10*3/uL (ref 150.0–400.0)
RBC: 4.95 Mil/uL (ref 3.87–5.11)
RDW: 14.2 % (ref 11.5–15.5)
WBC: 7.4 10*3/uL (ref 4.0–10.5)

## 2016-12-21 LAB — LIPID PANEL
CHOLESTEROL: 118 mg/dL (ref 0–200)
HDL: 27.4 mg/dL — AB (ref >39.00)
LDL Cholesterol: 72 mg/dL (ref 0–99)
NonHDL: 90.61
Total CHOL/HDL Ratio: 4
Triglycerides: 94 mg/dL (ref 0.0–149.0)
VLDL: 18.8 mg/dL (ref 0.0–40.0)

## 2016-12-21 LAB — TSH: TSH: 2.59 u[IU]/mL (ref 0.35–4.50)

## 2016-12-21 LAB — VITAMIN B12: Vitamin B-12: 1370 pg/mL — ABNORMAL HIGH (ref 211–911)

## 2016-12-21 LAB — HEPATIC FUNCTION PANEL
ALT: 18 U/L (ref 0–35)
AST: 20 U/L (ref 0–37)
Albumin: 3.7 g/dL (ref 3.5–5.2)
Alkaline Phosphatase: 71 U/L (ref 39–117)
BILIRUBIN TOTAL: 0.4 mg/dL (ref 0.2–1.2)
Bilirubin, Direct: 0.1 mg/dL (ref 0.0–0.3)
Total Protein: 6.7 g/dL (ref 6.0–8.3)

## 2016-12-21 LAB — HEMOGLOBIN A1C: HEMOGLOBIN A1C: 6.3 % (ref 4.6–6.5)

## 2016-12-21 NOTE — Assessment & Plan Note (Signed)
On B12 

## 2016-12-21 NOTE — Patient Instructions (Signed)

## 2016-12-21 NOTE — Assessment & Plan Note (Signed)
Od diet Labs

## 2016-12-21 NOTE — Assessment & Plan Note (Signed)
Coreg Hydralazine Furosemide

## 2016-12-21 NOTE — Assessment & Plan Note (Addendum)
Baby ASA - restart Lipitor Doppler - repeat in 9/18 RTC 6 mo

## 2016-12-21 NOTE — Progress Notes (Signed)
Subjective:  Patient ID: Bridget Oliver, female    DOB: 12-Feb-1941  Age: 76 y.o. MRN: [id number]  CC: No chief complaint on file.   HPI Bridget Oliver presents for a well exam The pt requested a blood type test  Outpatient Medications Prior to Visit  Medication Sig Dispense Refill  . acetaminophen (TYLENOL) 325 MG tablet Take 2 tablets (650 mg total) by mouth every 6 (six) hours as needed for moderate pain. 30 tablet 0  . acetaminophen-codeine (TYLENOL #3) 300-30 MG tablet Take 1 tablet by mouth every 8 (eight) hours as needed for moderate pain or severe pain. 30 tablet 1  . atorvastatin (LIPITOR) 40 MG tablet Take 1 tablet (40 mg total) by mouth daily. 7 tablet 0  . carvedilol (COREG) 25 MG tablet TAKE 1 TABLET TWICE DAILY WITH A MEAL 14 tablet 0  . Cholecalciferol (VITAMIN D3) 2000 UNITS capsule Take 1 capsule (2,000 Units total) by mouth daily. 100 capsule 3  . Coenzyme Q10 (COQ10) 100 MG CAPS Take by mouth.    . diclofenac sodium (VOLTAREN) 1 % GEL Apply 4 g topically 4 (four) times daily. 1 Tube 3  . Febuxostat (ULORIC) 80 MG TABS Take 1 tablet (80 mg total) by mouth daily. 7 tablet 0  . Fish Oil-Cholecalciferol (FISH OIL + D3) 1200-1000 MG-UNIT CAPS Take 1 tablet by mouth 2 (two) times daily.     . furosemide (LASIX) 80 MG tablet Take 1 tablet (80 mg total) by mouth daily. 7 tablet 0  . hydrALAZINE (APRESOLINE) 25 MG tablet TAKE 1 TABLET (25 MG TOTAL) BY MOUTH 3 (THREE) TIMES DAILY. 270 tablet 1  . Multiple Vitamins-Minerals (CENTRUM SILVER PO) Take 1 tablet by mouth daily.     . nitroGLYCERIN (NITROSTAT) 0.4 MG SL tablet Place 0.4 mg under the tongue every 5 (five) minutes as needed for chest pain. Reported on 05/28/2016    . Polyethyl Glycol-Propyl Glycol (SYSTANE FREE OP) Place 1 drop into both eyes daily as needed.    . potassium chloride SA (K-DUR,KLOR-CON) 20 MEQ tablet Take 1 tablet (20 mEq total) by mouth daily. 1 week supply until mail order comes in 7 tablet 0  . tiZANidine  (ZANAFLEX) 4 MG tablet Take 1 tablet (4 mg total) by mouth every 8 (eight) hours as needed for muscle spasms. 30 tablet 0  . vitamin B-12 (CYANOCOBALAMIN) 1000 MCG tablet Take 500 mcg by mouth every other day.     . vitamin C (ASCORBIC ACID) 500 MG tablet Take 500 mg by mouth daily as needed (for cold).      No facility-administered medications prior to visit.     ROS Review of Systems  Constitutional: Positive for unexpected weight change. Negative for activity change, appetite change, chills and fatigue.  HENT: Negative for congestion, mouth sores and sinus pressure.   Eyes: Negative for visual disturbance.  Respiratory: Negative for cough and chest tightness.   Gastrointestinal: Negative for abdominal pain and nausea.  Genitourinary: Negative for difficulty urinating, frequency and vaginal pain.  Musculoskeletal: Negative for back pain and gait problem.  Skin: Negative for pallor and rash.  Neurological: Negative for dizziness, tremors, weakness, numbness and headaches.  Psychiatric/Behavioral: Negative for confusion and sleep disturbance.    Objective:  BP 136/72   Pulse (!) 58   Temp 97.6 F (36.4 C)   Wt 243 lb (110.2 kg)   SpO2 97%   BMI 43.05 kg/m   BP Readings from Last 3 Encounters:  12/21/16 136/72  06/17/16 139/82  05/28/16 (!) 136/58    Wt Readings from Last 3 Encounters:  12/21/16 243 lb (110.2 kg)  06/17/16 238 lb (108 kg)  05/28/16 233 lb 12 oz (106 kg)    Physical Exam  Constitutional: She appears well-developed. No distress.  HENT:  Head: Normocephalic.  Right Ear: External ear normal.  Left Ear: External ear normal.  Nose: Nose normal.  Mouth/Throat: Oropharynx is clear and moist.  Eyes: Conjunctivae are normal. Pupils are equal, round, and reactive to light. Right eye exhibits no discharge. Left eye exhibits no discharge.  Neck: Normal range of motion. Neck supple. No JVD present. No tracheal deviation present. No thyromegaly present.    Cardiovascular: Normal rate, regular rhythm and normal heart sounds.   Pulmonary/Chest: No stridor. No respiratory distress. She has no wheezes.  Abdominal: Soft. Bowel sounds are normal. She exhibits no distension and no mass. There is no tenderness. There is no rebound and no guarding.  Musculoskeletal: She exhibits no edema or tenderness.  Lymphadenopathy:    She has no cervical adenopathy.  Neurological: She displays normal reflexes. No cranial nerve deficit. She exhibits normal muscle tone. Coordination normal.  Skin: No rash noted. No erythema.  Psychiatric: She has a normal mood and affect. Her behavior is normal. Judgment and thought content normal.  Obese  Lab Results  Component Value Date   WBC 8.1 12/16/2015   HGB 13.6 12/16/2015   HCT 41.8 12/16/2015   PLT 260.0 12/16/2015   GLUCOSE 109 (H) 06/17/2016   CHOL 142 12/16/2015   TRIG 127.0 12/16/2015   HDL 30.80 (L) 12/16/2015   LDLDIRECT 127.9 07/14/2011   LDLCALC 86 12/16/2015   ALT 20 12/16/2015   AST 19 12/16/2015   NA 140 06/17/2016   K 4.2 06/17/2016   CL 105 06/17/2016   CREATININE 1.52 (H) 06/17/2016   BUN 26 (H) 06/17/2016   CO2 28 06/17/2016   TSH 2.65 12/16/2015   INR 0.98 06/07/2014   HGBA1C 6.2 06/17/2016    US Carotid Bilateral  Result Date: 08/04/2016 CLINICAL DATA:  Bilateral carotid atherosclerosis EXAM: BILATERAL CAROTID DUPLEX ULTRASOUND TECHNIQUE: Pearline Cables scale imaging, color Doppler and duplex ultrasound were performed of bilateral carotid and vertebral arteries in the neck. COMPARISON:  03/08/2013 FINDINGS: Criteria: Quantification of carotid stenosis is based on velocity parameters that correlate the residual internal carotid diameter with NASCET-based stenosis levels, using the diameter of the distal internal carotid lumen as the denominator for stenosis measurement. The following velocity measurements were obtained: RIGHT ICA:  91/24 cm/sec CCA:  277/82 cm/sec SYSTOLIC ICA/CCA RATIO:  0.7  DIASTOLIC ICA/CCA RATIO:  0.9 ECA:  116 cm/sec LEFT ICA:  156/28 cm/sec CCA:  423/53 cm/sec SYSTOLIC ICA/CCA RATIO:  1.2 DIASTOLIC ICA/CCA RATIO:  1.0 ECA:  133 cm/sec RIGHT CAROTID ARTERY: Minor echogenic shadowing plaque formation. No hemodynamically significant right ICA stenosis, velocity elevation, or turbulent flow. Degree of narrowing less than 50%. RIGHT VERTEBRAL ARTERY:  Antegrade LEFT CAROTID ARTERY: Heterogeneous partially calcified left carotid system atherosclerosis. This narrows the proximal ICA lumen by grayscale imaging. Mild velocity elevation measuring 156/28 cm/sec. No significant turbulent flow. Degree of stenosis is estimated at the 50- 69% range (lower end of the range). LEFT VERTEBRAL ARTERY:  Antegrade IMPRESSION: Left greater than right carotid atherosclerosis. Minor right ICA narrowing, less than 50% Left ICA moderate stenosis estimated at 50- 69% (lower end of the range). Patent antegrade vertebral flow bilaterally Electronically Signed   By: Jerilynn Mages.  Shick M.D.   On:  08/04/2016 17:07    Assessment & Plan:   There are no diagnoses linked to this encounter. I am having Ms. Sandles maintain her vitamin B-12, Multiple Vitamins-Minerals (CENTRUM SILVER PO), vitamin C, FISH OIL + D3, Polyethyl Glycol-Propyl Glycol (SYSTANE FREE OP), nitroGLYCERIN, acetaminophen, Vitamin D3, CoQ10, tiZANidine, acetaminophen-codeine, diclofenac sodium, hydrALAZINE, atorvastatin, carvedilol, Febuxostat, furosemide, and potassium chloride SA.  No orders of the defined types were placed in this encounter.    Follow-up: No Follow-up on file.  Walker Kehr, MD

## 2016-12-21 NOTE — Progress Notes (Signed)
Patient received education resource, including the self-management goal and tool. Patient verbalized understanding. 

## 2016-12-21 NOTE — Assessment & Plan Note (Signed)
Here for medicare wellness/physical  Diet: heart healthy  Physical activity: sedentary  Depression/mood screen: negative  Hearing: intact to whispered voice  Visual acuity: grossly normal w/glasses, performs annual eye exam  ADLs: capable  Fall risk: low Home safety: good  Cognitive evaluation: intact to orientation, naming, recall and repetition  EOL planning: adv directives, full code/ I agree  I have personally reviewed and have noted  1. The patient's medical, surgical and social history  2. Their use of alcohol, tobacco or illicit drugs  3. Their current medications and supplements  4. The patient's functional ability including ADL's, fall risks, home safety risks and hearing or visual impairment.  5. Diet and physical activities  6. Evidence for depression or mood disorders 7. The roster of all physicians providing medical care to patient - is listed in the Snapshot section of the chart and reviewed today.    Today patient counseled on age appropriate routine health concerns for screening and prevention, each reviewed and up to date or declined. Immunizations reviewed and up to date or declined. Labs ordered and reviewed. Risk factors for depression reviewed and negative. Hearing function and visual acuity are intact. ADLs screened and addressed as needed. Functional ability and level of safety reviewed and appropriate. Education, counseling and referrals performed based on assessed risks today. Patient provided with a copy of personalized plan for preventive services.    Colon is due

## 2016-12-22 DIAGNOSIS — Z0000 Encounter for general adult medical examination without abnormal findings: Secondary | ICD-10-CM | POA: Diagnosis not present

## 2016-12-23 LAB — ABO/RH: Rh Factor: POSITIVE

## 2017-01-24 IMAGING — US US CAROTID DUPLEX BILAT
1 series · 13 of 24 positions shown · non-contrast
Comparison: 03/08/2013

CLINICAL DATA: Bilateral carotid atherosclerosis

EXAM:
BILATERAL CAROTID DUPLEX ULTRASOUND
TECHNIQUE: Gray scale imaging, color Doppler and duplex ultrasound were
performed of bilateral carotid and vertebral arteries in the neck.

[Series 1: us carotid duplex bilat · 0.08mm/px · 13 of 55 slices shown]
[im 1/55]
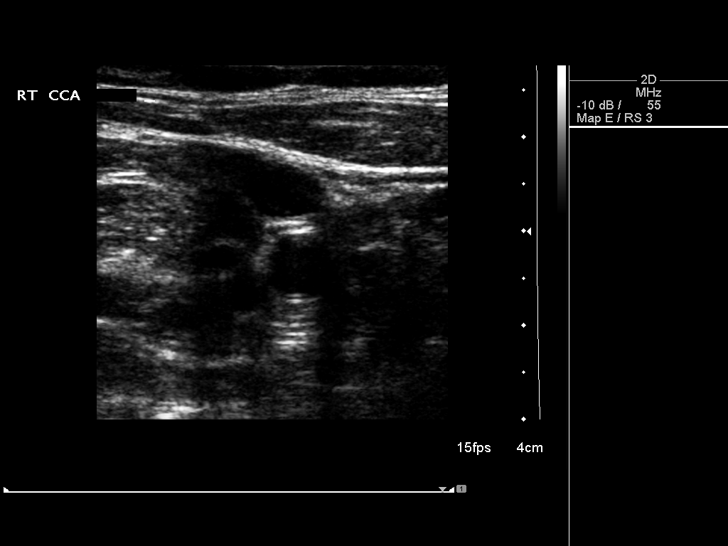
[im 5/55]
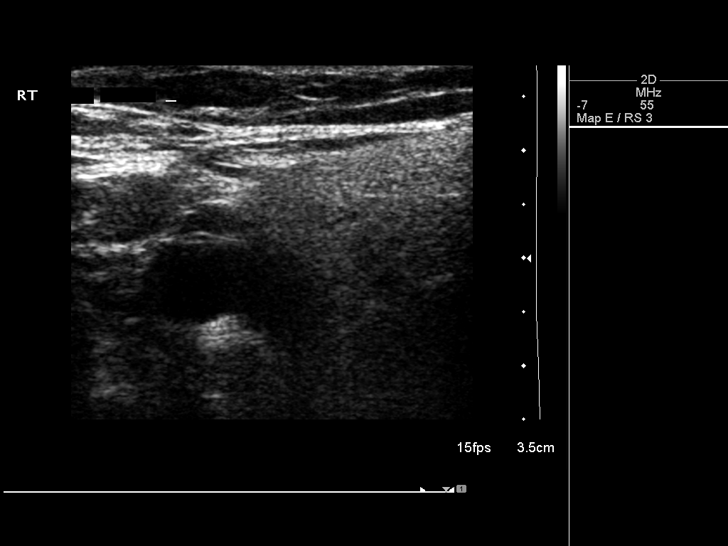
[im 10/55]
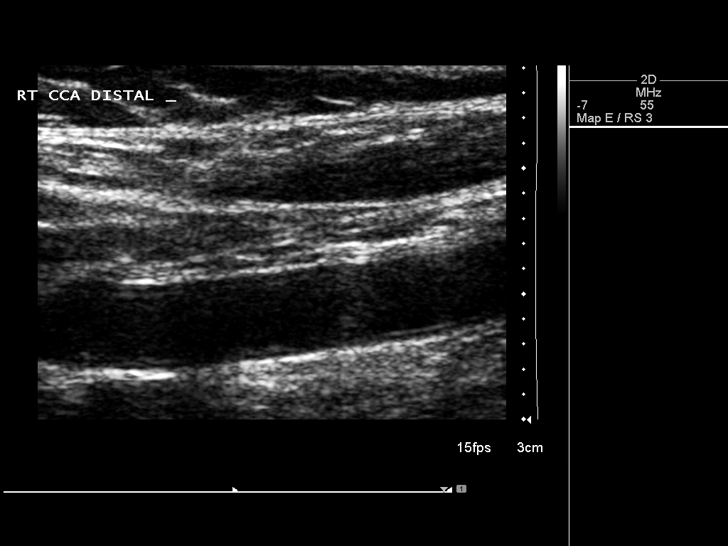
[im 15/55]
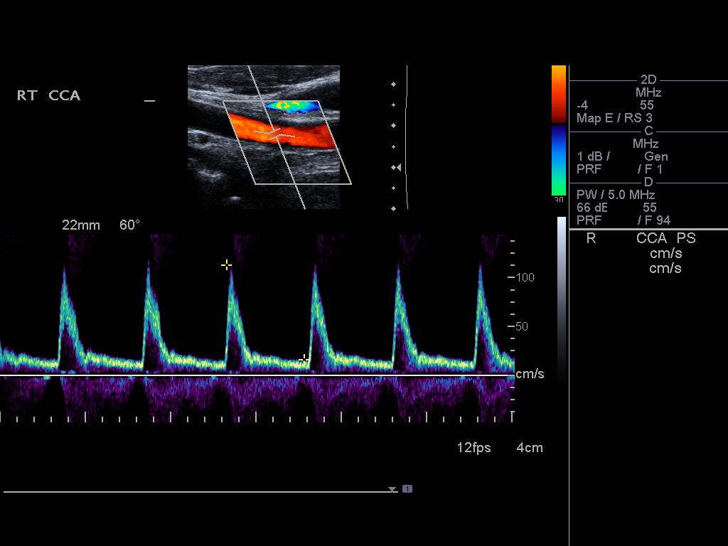
[im 19/55]
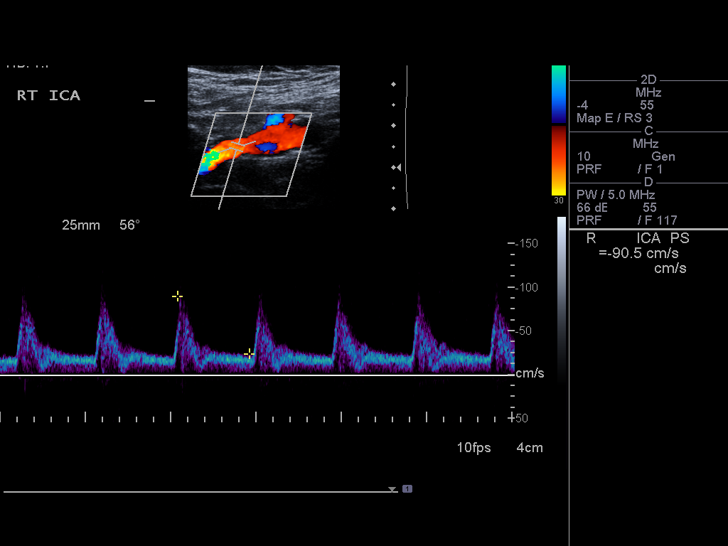
[im 24/55]
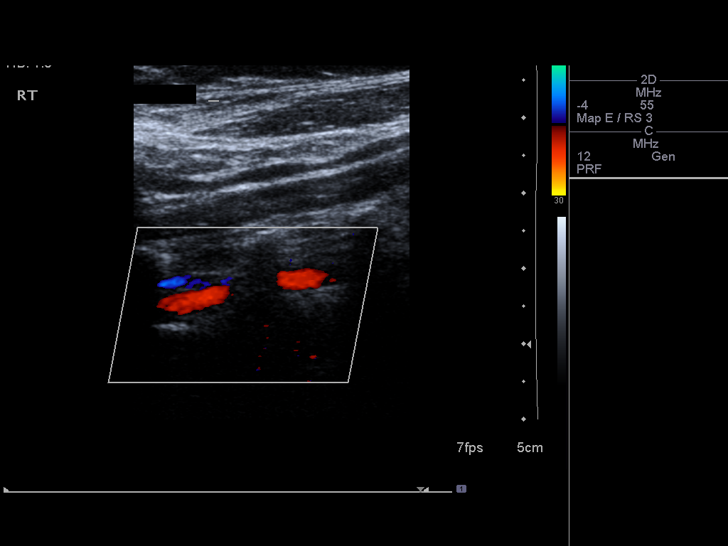
[im 29/55]
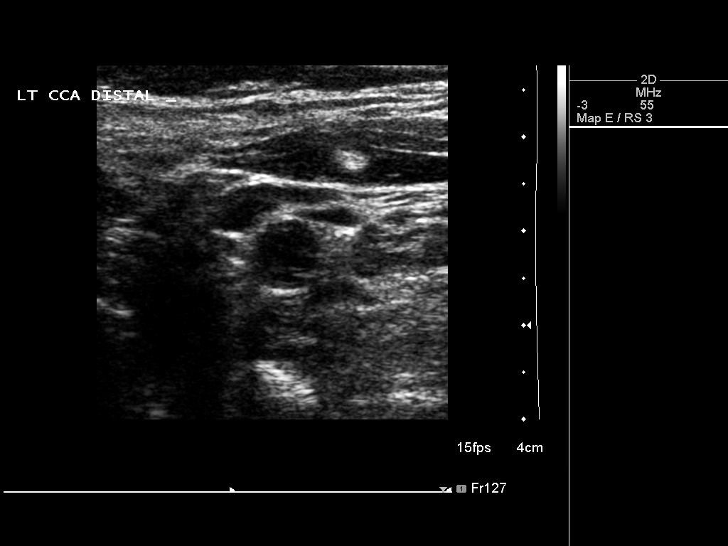
[im 31/55]
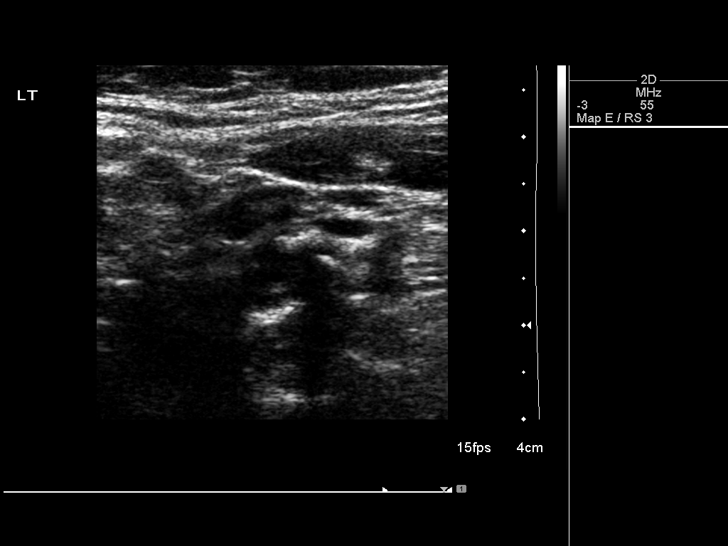
[im 36/55]
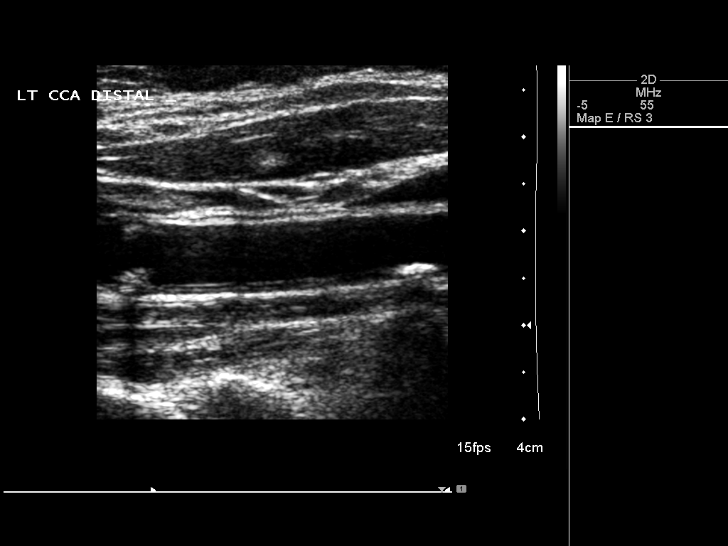
[im 40/55]
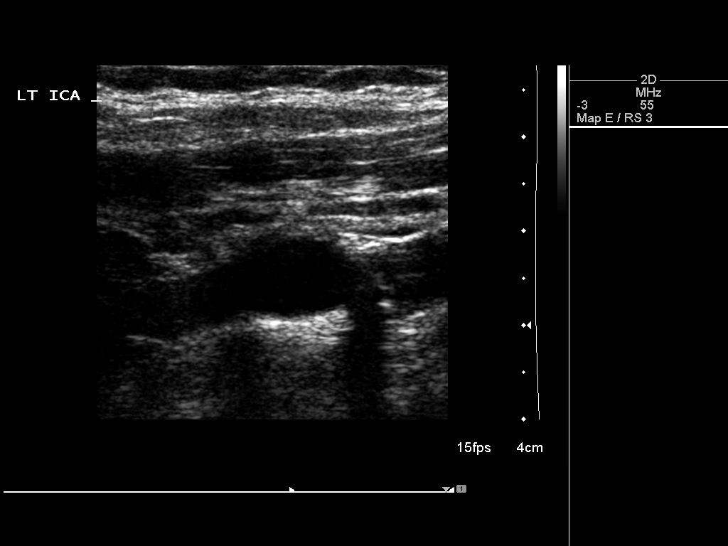
[im 45/55]
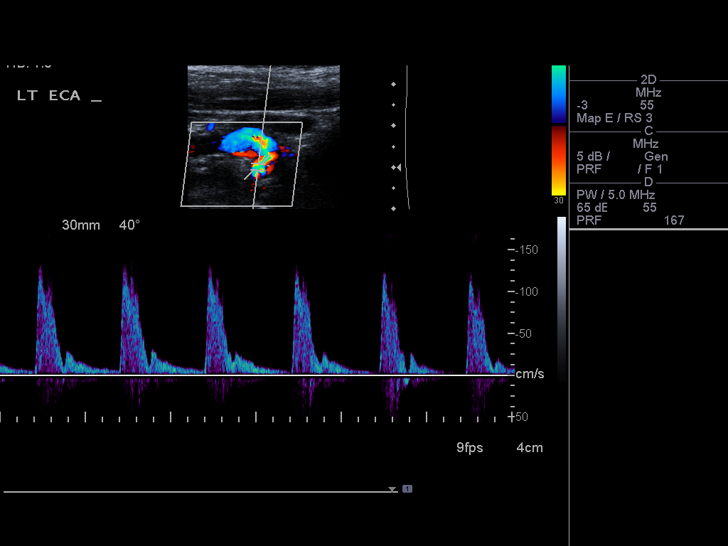
[im 50/55]
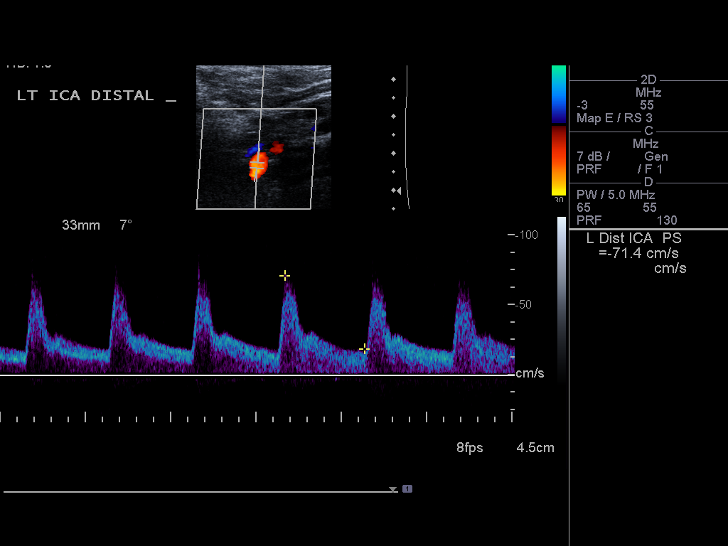
[im 55/55]
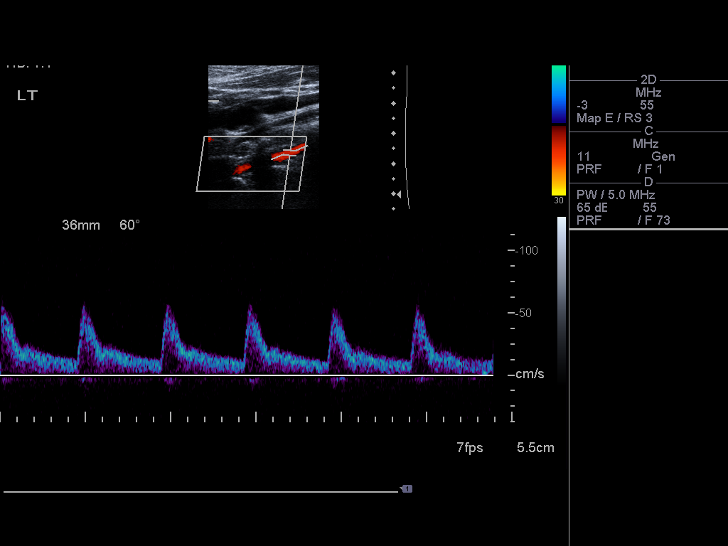

[13 of 24 positions shown; findings below may reference images not displayed]

FINDINGS: Criteria: Quantification of carotid stenosis is based on velocity
parameters that correlate the residual internal carotid diameter
with NASCET-based stenosis levels, using the diameter of the distal
internal carotid lumen as the denominator for stenosis measurement.

The following velocity measurements were obtained:

RIGHT

ICA:  91/24 cm/sec

CCA:  134/27 cm/sec

SYSTOLIC ICA/CCA RATIO:

DIASTOLIC ICA/CCA RATIO:

ECA:  116 cm/sec

LEFT

ICA:  156/28 cm/sec

CCA:  130/29 cm/sec

SYSTOLIC ICA/CCA RATIO:

DIASTOLIC ICA/CCA RATIO:

ECA:  133 cm/sec

RIGHT CAROTID ARTERY: Minor echogenic shadowing plaque formation. No
hemodynamically significant right ICA stenosis, velocity elevation,
or turbulent flow. Degree of narrowing less than 50%.

RIGHT VERTEBRAL ARTERY:  Antegrade

LEFT CAROTID ARTERY: Heterogeneous partially calcified left carotid
system atherosclerosis. This narrows the proximal ICA lumen by
grayscale imaging. Mild velocity elevation measuring 156/28 cm/sec.
No significant turbulent flow. Degree of stenosis is estimated at
the 50- 69% range (lower end of the range).

LEFT VERTEBRAL ARTERY:  Antegrade
IMPRESSION: Left greater than right carotid atherosclerosis.

Minor right ICA narrowing, less than 50%

Left ICA moderate stenosis estimated at 50- 69% (lower end of the
range).

Patent antegrade vertebral flow bilaterally

## 2017-01-27 DIAGNOSIS — N2581 Secondary hyperparathyroidism of renal origin: Secondary | ICD-10-CM | POA: Diagnosis not present

## 2017-01-27 DIAGNOSIS — D631 Anemia in chronic kidney disease: Secondary | ICD-10-CM | POA: Diagnosis not present

## 2017-01-27 DIAGNOSIS — N183 Chronic kidney disease, stage 3 (moderate): Secondary | ICD-10-CM | POA: Diagnosis not present

## 2017-03-14 ENCOUNTER — Other Ambulatory Visit: Payer: Self-pay | Admitting: Internal Medicine

## 2017-03-15 DIAGNOSIS — Z1231 Encounter for screening mammogram for malignant neoplasm of breast: Secondary | ICD-10-CM | POA: Diagnosis not present

## 2017-03-15 LAB — HM MAMMOGRAPHY

## 2017-03-18 ENCOUNTER — Encounter: Payer: Self-pay | Admitting: Internal Medicine

## 2017-03-18 NOTE — Progress Notes (Signed)
Abstracted result and sent to scan  

## 2017-03-23 DIAGNOSIS — R69 Illness, unspecified: Secondary | ICD-10-CM | POA: Diagnosis not present

## 2017-04-30 DIAGNOSIS — E669 Obesity, unspecified: Secondary | ICD-10-CM | POA: Diagnosis not present

## 2017-04-30 DIAGNOSIS — N2581 Secondary hyperparathyroidism of renal origin: Secondary | ICD-10-CM | POA: Diagnosis not present

## 2017-04-30 DIAGNOSIS — D631 Anemia in chronic kidney disease: Secondary | ICD-10-CM | POA: Diagnosis not present

## 2017-04-30 DIAGNOSIS — N183 Chronic kidney disease, stage 3 (moderate): Secondary | ICD-10-CM | POA: Diagnosis not present

## 2017-05-20 DIAGNOSIS — H04123 Dry eye syndrome of bilateral lacrimal glands: Secondary | ICD-10-CM | POA: Diagnosis not present

## 2017-05-20 DIAGNOSIS — H40023 Open angle with borderline findings, high risk, bilateral: Secondary | ICD-10-CM | POA: Diagnosis not present

## 2017-06-15 ENCOUNTER — Telehealth: Payer: Self-pay | Admitting: Internal Medicine

## 2017-06-15 MED ORDER — HYDRALAZINE HCL 25 MG PO TABS: ORAL_TABLET | ORAL | 0 refills | Status: DC

## 2017-06-15 NOTE — Telephone Encounter (Signed)
Reviewed chart will send 30 day until appt 8/52/18.Marland KitchenJohny Oliver

## 2017-06-15 NOTE — Telephone Encounter (Signed)
Pt called requesting a refill on her hydrALAZINE (APRESOLINE) 25 MG tablet [facility] [address]. Pt is scheduled to see Dr Alain Marion on 06/24/2017 for a 6 month follow up.

## 2017-06-18 ENCOUNTER — Ambulatory Visit: Payer: Medicare HMO | Admitting: Internal Medicine

## 2017-06-24 ENCOUNTER — Ambulatory Visit (INDEPENDENT_AMBULATORY_CARE_PROVIDER_SITE_OTHER): Payer: Medicare HMO | Admitting: Internal Medicine

## 2017-06-24 ENCOUNTER — Other Ambulatory Visit (INDEPENDENT_AMBULATORY_CARE_PROVIDER_SITE_OTHER): Payer: Medicare HMO

## 2017-06-24 ENCOUNTER — Encounter: Payer: Self-pay | Admitting: Internal Medicine

## 2017-06-24 DIAGNOSIS — M1000 Idiopathic gout, unspecified site: Secondary | ICD-10-CM

## 2017-06-24 DIAGNOSIS — I119 Hypertensive heart disease without heart failure: Secondary | ICD-10-CM

## 2017-06-24 DIAGNOSIS — E538 Deficiency of other specified B group vitamins: Secondary | ICD-10-CM

## 2017-06-24 DIAGNOSIS — K219 Gastro-esophageal reflux disease without esophagitis: Secondary | ICD-10-CM | POA: Insufficient documentation

## 2017-06-24 LAB — HEPATIC FUNCTION PANEL
ALT: 18 U/L (ref 0–35)
AST: 17 U/L (ref 0–37)
Albumin: 3.7 g/dL (ref 3.5–5.2)
Alkaline Phosphatase: 78 U/L (ref 39–117)
BILIRUBIN TOTAL: 0.5 mg/dL (ref 0.2–1.2)
Bilirubin, Direct: 0.1 mg/dL (ref 0.0–0.3)
Total Protein: 6.8 g/dL (ref 6.0–8.3)

## 2017-06-24 LAB — BASIC METABOLIC PANEL
BUN: 19 mg/dL (ref 6–23)
CO2: 27 mEq/L (ref 19–32)
Calcium: 9.1 mg/dL (ref 8.4–10.5)
Chloride: 104 mEq/L (ref 96–112)
Creatinine, Ser: 1.28 mg/dL — ABNORMAL HIGH (ref 0.40–1.20)
GFR: 52.26 mL/min — AB (ref >60.00)
GLUCOSE: 103 mg/dL — AB (ref 70–99)
POTASSIUM: 4.1 meq/L (ref 3.5–5.1)
Sodium: 136 mEq/L (ref 135–145)

## 2017-06-24 LAB — HEMOGLOBIN A1C: Hgb A1c MFr Bld: 6.3 % (ref 4.6–6.5)

## 2017-06-24 LAB — CBC WITH DIFFERENTIAL/PLATELET
Basophils Absolute: 0.1 10*3/uL (ref 0.0–0.1)
Basophils Relative: 0.9 % (ref 0.0–3.0)
EOS PCT: 1.4 % (ref 0.0–5.0)
Eosinophils Absolute: 0.1 10*3/uL (ref 0.0–0.7)
HCT: 41.6 % (ref 36.0–46.0)
Hemoglobin: 13.6 g/dL (ref 12.0–15.0)
LYMPHS ABS: 1.7 10*3/uL (ref 0.7–4.0)
Lymphocytes Relative: 23.3 % (ref 12.0–46.0)
MCHC: 32.8 g/dL (ref 30.0–36.0)
MCV: 83.4 fl (ref 78.0–100.0)
MONO ABS: 0.8 10*3/uL (ref 0.1–1.0)
Monocytes Relative: 11.1 % (ref 3.0–12.0)
NEUTROS PCT: 63.3 % (ref 43.0–77.0)
Neutro Abs: 4.5 10*3/uL (ref 1.4–7.7)
Platelets: 232 10*3/uL (ref 150.0–400.0)
RBC: 4.98 Mil/uL (ref 3.87–5.11)
RDW: 14.7 % (ref 11.5–15.5)
WBC: 7.1 10*3/uL (ref 4.0–10.5)

## 2017-06-24 LAB — LIPID PANEL
CHOLESTEROL: 137 mg/dL (ref 0–200)
HDL: 29.2 mg/dL — AB (ref >39.00)
LDL CALC: 83 mg/dL (ref 0–99)
NonHDL: 108.09
TRIGLYCERIDES: 124 mg/dL (ref 0.0–149.0)
Total CHOL/HDL Ratio: 5
VLDL: 24.8 mg/dL (ref 0.0–40.0)

## 2017-06-24 LAB — H. PYLORI ANTIBODY, IGG: H Pylori IgG: NEGATIVE

## 2017-06-24 MED ORDER — PANTOPRAZOLE SODIUM 40 MG PO TBEC: 40.0000 mg | DELAYED_RELEASE_TABLET | Freq: Every day | ORAL | 5 refills | Status: DC

## 2017-06-24 NOTE — Assessment & Plan Note (Signed)
Wt Readings from Last 3 Encounters:  06/24/17 239 lb (108.4 kg)  12/21/16 243 lb (110.2 kg)  06/17/16 238 lb (108 kg)

## 2017-06-24 NOTE — Assessment & Plan Note (Signed)
BMET 

## 2017-06-24 NOTE — Assessment & Plan Note (Signed)
On B12 

## 2017-06-24 NOTE — Assessment & Plan Note (Addendum)
D/c fish oil Hold Lipitor Protonix 40 mg/d GI ref if sx's not resolved - RTC 1 mo

## 2017-06-24 NOTE — Assessment & Plan Note (Signed)
[place] No relapse

## 2017-06-24 NOTE — Assessment & Plan Note (Signed)
Labs

## 2017-06-24 NOTE — Progress Notes (Signed)
Subjective:  Patient ID: Bridget Oliver, female    DOB: 1941-01-28  Age: 76 y.o. MRN: [id number]  CC: No chief complaint on file.   HPI Bridget Oliver presents for GERD - worse, HTN, B12 def f/u  Outpatient Medications Prior to Visit  Medication Sig Dispense Refill  . acetaminophen (TYLENOL) 325 MG tablet Take 2 tablets (650 mg total) by mouth every 6 (six) hours as needed for moderate pain. 30 tablet 0  . acetaminophen-codeine (TYLENOL #3) 300-30 MG tablet Take 1 tablet by mouth every 8 (eight) hours as needed for moderate pain or severe pain. 30 tablet 1  . atorvastatin (LIPITOR) 40 MG tablet Take 1 tablet (40 mg total) by mouth daily. 7 tablet 0  . carvedilol (COREG) 25 MG tablet TAKE 1 TABLET TWICE DAILY WITH A MEAL 14 tablet 0  . Cholecalciferol (VITAMIN D3) 2000 UNITS capsule Take 1 capsule (2,000 Units total) by mouth daily. 100 capsule 3  . Coenzyme Q10 (COQ10) 100 MG CAPS Take by mouth.    . diclofenac sodium (VOLTAREN) 1 % GEL Apply 4 g topically 4 (four) times daily. 1 Tube 3  . Febuxostat (ULORIC) 80 MG TABS Take 1 tablet (80 mg total) by mouth daily. 7 tablet 0  . Fish Oil-Cholecalciferol (FISH OIL + D3) 1200-1000 MG-UNIT CAPS Take 1 tablet by mouth 2 (two) times daily.     . furosemide (LASIX) 80 MG tablet Take 1 tablet (80 mg total) by mouth daily. 7 tablet 0  . hydrALAZINE (APRESOLINE) 25 MG tablet TAKE 1 TABLET (25 MG TOTAL) BY MOUTH 3 (THREE) TIMES DAILY. 270 tablet 1  . hydrALAZINE (APRESOLINE) 25 MG tablet TAKE 1 TABLET (25 MG TOTAL) BY MOUTH 3 (THREE) TIMES DAILY. 90 tablet 0  . Multiple Vitamins-Minerals (CENTRUM SILVER PO) Take 1 tablet by mouth daily.     . nitroGLYCERIN (NITROSTAT) 0.4 MG SL tablet Place 0.4 mg under the tongue every 5 (five) minutes as needed for chest pain. Reported on 05/28/2016    . Polyethyl Glycol-Propyl Glycol (SYSTANE FREE OP) Place 1 drop into both eyes daily as needed.    . potassium chloride SA (K-DUR,KLOR-CON) 20 MEQ tablet Take 1 tablet (20  mEq total) by mouth daily. 1 week supply until mail order comes in 7 tablet 0  . tiZANidine (ZANAFLEX) 4 MG tablet Take 1 tablet (4 mg total) by mouth every 8 (eight) hours as needed for muscle spasms. 30 tablet 0  . vitamin B-12 (CYANOCOBALAMIN) 1000 MCG tablet Take 500 mcg by mouth every other day.     . vitamin C (ASCORBIC ACID) 500 MG tablet Take 500 mg by mouth daily as needed (for cold).      No facility-administered medications prior to visit.     ROS Review of Systems  Constitutional: Negative for activity change, appetite change, chills, fatigue and unexpected weight change.  HENT: Negative for congestion, mouth sores and sinus pressure.   Eyes: Negative for visual disturbance.  Respiratory: Negative for cough and chest tightness.   Gastrointestinal: Negative for abdominal pain and nausea.  Genitourinary: Negative for difficulty urinating, frequency and vaginal pain.  Musculoskeletal: Positive for arthralgias and back pain. Negative for gait problem.  Skin: Negative for pallor and rash.  Neurological: Negative for dizziness, tremors, weakness, numbness and headaches.  Psychiatric/Behavioral: Negative for confusion and sleep disturbance.    Objective:  BP 136/72 (BP Location: Left Arm, Patient Position: Sitting, Cuff Size: Large)   Pulse 64   Temp 98.4 F (  36.9 C) (Oral)   Ht 5\' 3"  (1.6 m)   Wt 239 lb (108.4 kg)   SpO2 98%   BMI 42.34 kg/m   BP Readings from Last 3 Encounters:  06/24/17 136/72  12/21/16 136/72  06/17/16 139/82    Wt Readings from Last 3 Encounters:  06/24/17 239 lb (108.4 kg)  12/21/16 243 lb (110.2 kg)  06/17/16 238 lb (108 kg)    Physical Exam  Constitutional: She appears well-developed. No distress.  HENT:  Head: Normocephalic.  Right Ear: External ear normal.  Left Ear: External ear normal.  Nose: Nose normal.  Mouth/Throat: Oropharynx is clear and moist.  Eyes: Pupils are equal, round, and reactive to light. Conjunctivae are normal.  Right eye exhibits no discharge. Left eye exhibits no discharge.  Neck: Normal range of motion. Neck supple. No JVD present. No tracheal deviation present. No thyromegaly present.  Cardiovascular: Normal rate, regular rhythm and normal heart sounds.   Pulmonary/Chest: No stridor. No respiratory distress. She has no wheezes.  Abdominal: Soft. Bowel sounds are normal. She exhibits no distension and no mass. There is no tenderness. There is no rebound and no guarding.  Musculoskeletal: She exhibits no edema or tenderness.  Lymphadenopathy:    She has no cervical adenopathy.  Neurological: She displays normal reflexes. No cranial nerve deficit. She exhibits normal muscle tone. Coordination normal.  Skin: No rash noted. No erythema.  Psychiatric: She has a normal mood and affect. Her behavior is normal. Judgment and thought content normal.  obese  Lab Results  Component Value Date   WBC 7.4 12/21/2016   HGB 13.4 12/21/2016   HCT 40.6 12/21/2016   PLT 250.0 12/21/2016   GLUCOSE 108 (H) 12/21/2016   CHOL 118 12/21/2016   TRIG 94.0 12/21/2016   HDL 27.40 (L) 12/21/2016   LDLDIRECT 127.9 07/14/2011   LDLCALC 72 12/21/2016   ALT 18 12/21/2016   AST 20 12/21/2016   NA 139 12/21/2016   K 4.2 12/21/2016   CL 105 12/21/2016   CREATININE 1.37 (H) 12/21/2016   BUN 20 12/21/2016   CO2 26 12/21/2016   TSH 2.59 12/21/2016   INR 0.98 06/07/2014   HGBA1C 6.3 12/21/2016    US Carotid Bilateral  Result Date: 08/04/2016 CLINICAL DATA:  Bilateral carotid atherosclerosis EXAM: BILATERAL CAROTID DUPLEX ULTRASOUND TECHNIQUE: Pearline Cables scale imaging, color Doppler and duplex ultrasound were performed of bilateral carotid and vertebral arteries in the neck. COMPARISON:  03/08/2013 FINDINGS: Criteria: Quantification of carotid stenosis is based on velocity parameters that correlate the residual internal carotid diameter with NASCET-based stenosis levels, using the diameter of the distal internal carotid lumen as  the denominator for stenosis measurement. The following velocity measurements were obtained: RIGHT ICA:  91/24 cm/sec CCA:  373/42 cm/sec SYSTOLIC ICA/CCA RATIO:  0.7 DIASTOLIC ICA/CCA RATIO:  0.9 ECA:  116 cm/sec LEFT ICA:  156/28 cm/sec CCA:  876/81 cm/sec SYSTOLIC ICA/CCA RATIO:  1.2 DIASTOLIC ICA/CCA RATIO:  1.0 ECA:  133 cm/sec RIGHT CAROTID ARTERY: Minor echogenic shadowing plaque formation. No hemodynamically significant right ICA stenosis, velocity elevation, or turbulent flow. Degree of narrowing less than 50%. RIGHT VERTEBRAL ARTERY:  Antegrade LEFT CAROTID ARTERY: Heterogeneous partially calcified left carotid system atherosclerosis. This narrows the proximal ICA lumen by grayscale imaging. Mild velocity elevation measuring 156/28 cm/sec. No significant turbulent flow. Degree of stenosis is estimated at the 50- 69% range (lower end of the range). LEFT VERTEBRAL ARTERY:  Antegrade IMPRESSION: Left greater than right carotid atherosclerosis. Minor right ICA narrowing,  less than 50% Left ICA moderate stenosis estimated at 50- 69% (lower end of the range). Patent antegrade vertebral flow bilaterally Electronically Signed   By: Jerilynn Mages.  Shick M.D.   On: 08/04/2016 17:07    Assessment & Plan:   There are no diagnoses linked to this encounter. I am having Ms. Simmer maintain her vitamin B-12, Multiple Vitamins-Minerals (CENTRUM SILVER PO), vitamin C, FISH OIL + D3, Polyethyl Glycol-Propyl Glycol (SYSTANE FREE OP), nitroGLYCERIN, acetaminophen, Vitamin D3, CoQ10, tiZANidine, acetaminophen-codeine, diclofenac sodium, hydrALAZINE, atorvastatin, carvedilol, Febuxostat, furosemide, potassium chloride SA, and hydrALAZINE.  No orders of the defined types were placed in this encounter.    Follow-up: No Follow-up on file.  Walker Kehr, MD

## 2017-09-11 ENCOUNTER — Ambulatory Visit (INDEPENDENT_AMBULATORY_CARE_PROVIDER_SITE_OTHER): Payer: Medicare HMO

## 2017-09-11 DIAGNOSIS — Z23 Encounter for immunization: Secondary | ICD-10-CM | POA: Diagnosis not present

## 2017-09-17 ENCOUNTER — Other Ambulatory Visit: Payer: Self-pay | Admitting: Internal Medicine

## 2017-09-27 DIAGNOSIS — R69 Illness, unspecified: Secondary | ICD-10-CM | POA: Diagnosis not present

## 2017-09-30 ENCOUNTER — Telehealth: Payer: Self-pay | Admitting: Internal Medicine

## 2017-09-30 DIAGNOSIS — Z1211 Encounter for screening for malignant neoplasm of colon: Secondary | ICD-10-CM

## 2017-09-30 NOTE — Telephone Encounter (Signed)
Order entered

## 2017-09-30 NOTE — Telephone Encounter (Signed)
Pt called in and would like a referral to get your Colonoscopy .  She thinks she is due for one in [place]

## 2017-10-01 ENCOUNTER — Encounter: Payer: Self-pay | Admitting: Gastroenterology

## 2017-11-19 DIAGNOSIS — H40023 Open angle with borderline findings, high risk, bilateral: Secondary | ICD-10-CM | POA: Diagnosis not present

## 2017-11-19 DIAGNOSIS — E113291 Type 2 diabetes mellitus with mild nonproliferative diabetic retinopathy without macular edema, right eye: Secondary | ICD-10-CM | POA: Diagnosis not present

## 2017-11-19 DIAGNOSIS — H35343 Macular cyst, hole, or pseudohole, bilateral: Secondary | ICD-10-CM | POA: Diagnosis not present

## 2017-11-19 DIAGNOSIS — H35033 Hypertensive retinopathy, bilateral: Secondary | ICD-10-CM | POA: Diagnosis not present

## 2017-12-02 ENCOUNTER — Telehealth: Payer: Self-pay | Admitting: Internal Medicine

## 2017-12-02 MED ORDER — FEBUXOSTAT 80 MG PO TABS: 1.0000 | ORAL_TABLET | Freq: Every day | ORAL | 1 refills | Status: DC

## 2017-12-02 MED ORDER — HYDRALAZINE HCL 25 MG PO TABS: 25.0000 mg | ORAL_TABLET | Freq: Three times a day (TID) | ORAL | 1 refills | Status: DC

## 2017-12-02 MED ORDER — ATORVASTATIN CALCIUM 40 MG PO TABS: 40.0000 mg | ORAL_TABLET | Freq: Every day | ORAL | 1 refills | Status: DC

## 2017-12-02 MED ORDER — FUROSEMIDE 80 MG PO TABS: 80.0000 mg | ORAL_TABLET | Freq: Every day | ORAL | 1 refills | Status: DC

## 2017-12-02 MED ORDER — CARVEDILOL 25 MG PO TABS: ORAL_TABLET | ORAL | 1 refills | Status: DC

## 2017-12-02 NOTE — Telephone Encounter (Signed)
Copied from [place] [phone]. Topic: Quick Communication - Rx Refill/Question >> Dec 02, 2017 11:15 AM Robina Ade, Helene Kelp D wrote: Medication: furosemide (LASIX) 80 MG tablet, carvedilol (COREG) 25 MG tablet, atorvastatin (LIPITOR) 40 MG tablet, potassium chloride SA (K-DUR,KLOR-CON) 20 MEQ tablet, Febuxostat (ULORIC) 80 MG TABS and hydrALAZINE (APRESOLINE) 25 MG tablet (Agent: If no, request that the patient contact the pharmacy for the refill.) Yes Preferred Pharmacy (with phone number or street name): [facility], [place]: Please be advised that RX refills may take up to 3 business days. We ask that you follow-up with your pharmacy.

## 2017-12-02 NOTE — Telephone Encounter (Signed)
Pls advise if ok to send Klor con. Pt has not been taking last filled for only 7 pills back in 11/29/16.Marland KitchenJohny Chess

## 2017-12-02 NOTE — Telephone Encounter (Signed)
All medications refilled except for KDur.

## 2017-12-06 MED ORDER — POTASSIUM CHLORIDE CRYS ER 20 MEQ PO TBCR: 20.0000 meq | EXTENDED_RELEASE_TABLET | Freq: Every day | ORAL | 3 refills | Status: DC

## 2017-12-06 NOTE — Telephone Encounter (Signed)
Rx emailed Thx

## 2017-12-06 NOTE — Telephone Encounter (Signed)
Dr. Alain Marion Bridget Oliver is requesting refill on her Potassium, Pls advise if ok to fill. Per records she has not had a refill since 11/29/16...[name]

## 2017-12-06 NOTE — Addendum Note (Signed)
Addended by: Cassandria Anger on: 12/06/2017 04:20 PM   Modules accepted: Orders

## 2017-12-13 ENCOUNTER — Ambulatory Visit (AMBULATORY_SURGERY_CENTER): Payer: Self-pay | Admitting: *Deleted

## 2017-12-13 ENCOUNTER — Other Ambulatory Visit: Payer: Self-pay

## 2017-12-13 VITALS — Ht 63.5 in | Wt 239.0 lb

## 2017-12-13 DIAGNOSIS — Z86010 Personal history of colonic polyps: Secondary | ICD-10-CM

## 2017-12-13 MED ORDER — PEG 3350-KCL-NA BICARB-NACL 420 G PO SOLR: 4000.0000 mL | Freq: Once | ORAL | 0 refills | Status: AC

## 2017-12-13 NOTE — Progress Notes (Signed)
Denies allergies to eggs or soy products. Denies complications with sedation or anesthesia. Denies O2 use. Denies use of diet or weight loss medications.  Emmi instructions given for colonoscopy.  

## 2017-12-15 ENCOUNTER — Other Ambulatory Visit: Payer: Self-pay | Admitting: Internal Medicine

## 2017-12-15 ENCOUNTER — Encounter: Payer: Self-pay | Admitting: Gastroenterology

## 2017-12-21 ENCOUNTER — Encounter: Payer: Self-pay | Admitting: Gastroenterology

## 2017-12-21 ENCOUNTER — Ambulatory Visit (AMBULATORY_SURGERY_CENTER): Payer: Medicare HMO | Admitting: Gastroenterology

## 2017-12-21 ENCOUNTER — Other Ambulatory Visit: Payer: Self-pay

## 2017-12-21 VITALS — BP 172/46 | HR 70 | Temp 96.8°F | Resp 11 | Ht 63.5 in | Wt 239.0 lb

## 2017-12-21 DIAGNOSIS — Z86010 Personal history of colonic polyps: Secondary | ICD-10-CM | POA: Diagnosis present

## 2017-12-21 DIAGNOSIS — D122 Benign neoplasm of ascending colon: Secondary | ICD-10-CM

## 2017-12-21 DIAGNOSIS — K635 Polyp of colon: Secondary | ICD-10-CM | POA: Diagnosis not present

## 2017-12-21 DIAGNOSIS — I2510 Atherosclerotic heart disease of native coronary artery without angina pectoris: Secondary | ICD-10-CM | POA: Diagnosis not present

## 2017-12-21 DIAGNOSIS — D123 Benign neoplasm of transverse colon: Secondary | ICD-10-CM | POA: Diagnosis not present

## 2017-12-21 DIAGNOSIS — I10 Essential (primary) hypertension: Secondary | ICD-10-CM | POA: Diagnosis not present

## 2017-12-21 MED ORDER — SODIUM CHLORIDE 0.9 % IV SOLN: 500.0000 mL | Freq: Once | INTRAVENOUS | Status: DC

## 2017-12-21 NOTE — Progress Notes (Signed)
Report to PACU, RN, vss, BBS= Clear.  

## 2017-12-21 NOTE — Progress Notes (Signed)
Called to room to assist during endoscopic procedure.  Patient ID and intended procedure confirmed with present staff. Received instructions for my participation in the procedure from the performing physician.  

## 2017-12-21 NOTE — Patient Instructions (Signed)
YOU HAD AN ENDOSCOPIC PROCEDURE TODAY AT [facility]:   Refer to the procedure report that was given to you for any specific questions about what was found during the examination.  If the procedure report does not answer your questions, please call your gastroenterologist to clarify.  If you requested that your care partner not be given the details of your procedure findings, then the procedure report has been included in a sealed envelope for you to review at your convenience later.  YOU SHOULD EXPECT: Some feelings of bloating in the abdomen. Passage of more gas than usual.  Walking can help get rid of the air that was put into your GI tract during the procedure and reduce the bloating. If you had a lower endoscopy (such as a colonoscopy or flexible sigmoidoscopy) you may notice spotting of blood in your stool or on the toilet paper. If you underwent a bowel prep for your procedure, you may not have a normal bowel movement for a few days.  Please Note:  You might notice some irritation and congestion in your nose or some drainage.  This is from the oxygen used during your procedure.  There is no need for concern and it should clear up in a day or so.  SYMPTOMS TO REPORT IMMEDIATELY:   Following lower endoscopy (colonoscopy or flexible sigmoidoscopy):  Excessive amounts of blood in the stool  Significant tenderness or worsening of abdominal pains  Swelling of the abdomen that is new, acute  Fever of 100F or higher  For urgent or emergent issues, a gastroenterologist can be reached at any hour by calling [phone].   DIET:  We do recommend a small meal at first, but then you may proceed to your regular diet.  Drink plenty of fluids but you should avoid alcoholic beverages for 24 hours.  ACTIVITY:  You should plan to take it easy for the rest of today and you should NOT DRIVE or use heavy machinery until tomorrow (because of the sedation medicines used during the test).     FOLLOW UP: Our staff will call the number listed on your records the next business day following your procedure to check on you and address any questions or concerns that you may have regarding the information given to you following your procedure. If we do not reach you, we will leave a message.  However, if you are feeling well and you are not experiencing any problems, there is no need to return our call.  We will assume that you have returned to your regular daily activities without incident.  If any biopsies were taken you will be contacted by phone or by letter within the next 1-3 weeks.  Please call us at [phone] if you have not heard about the biopsies in 3 weeks.   Await for biopsy results Polyps (handout given) Diverticulosis (handout given) High Fiber Diet (handout given) No repeat Colonoscopy due to age  SIGNATURES/CONFIDENTIALITY: You and/or your care partner have signed paperwork which will be entered into your electronic medical record.  These signatures attest to the fact that that the information above on your After Visit Summary has been reviewed and is understood.  Full responsibility of the confidentiality of this discharge information lies with you and/or your care-partner.

## 2017-12-21 NOTE — Op Note (Signed)
[facility] Patient Name: Bridget Oliver Procedure Date: 12/21/2017 9:29 AM MRN: [id number] Endoscopist: Ladene Artist , MD Age: 77 Referring MD:  Date of Birth: 10/19/1941 Gender: Female Account #: [id number] Procedure:                Colonoscopy Indications:              Surveillance: Personal history of adenomatous                            polyps on last colonoscopy 5 years ago Medicines:                Monitored Anesthesia Care Procedure:                Pre-Anesthesia Assessment:                           - Prior to the procedure, a History and Physical                            was performed, and patient medications and                            allergies were reviewed. The patient's tolerance of                            previous anesthesia was also reviewed. The risks                            and benefits of the procedure and the sedation                            options and risks were discussed with the patient.                            All questions were answered, and informed consent                            was obtained. Prior Anticoagulants: The patient has                            taken no previous anticoagulant or antiplatelet                            agents. ASA Grade Assessment: III - A patient with                            severe systemic disease. After reviewing the risks                            and benefits, the patient was deemed in                            satisfactory condition to undergo the procedure.  After obtaining informed consent, the colonoscope                            was passed under direct vision. Throughout the                            procedure, the patient's blood pressure, pulse, and                            oxygen saturations were monitored continuously. The                            Colonoscope was introduced through the anus and                            advanced to the the cecum,  identified by                            appendiceal orifice and ileocecal valve. The                            ileocecal valve, appendiceal orifice, and rectum                            were photographed. The quality of the bowel                            preparation was good. The colonoscopy was performed                            without difficulty. The patient tolerated the                            procedure well. Scope In: 9:43:11 AM Scope Out: 9:54:18 AM Scope Withdrawal Time: 0 hours 9 minutes 46 seconds  Total Procedure Duration: 0 hours 11 minutes 7 seconds  Findings:                 The perianal and digital rectal examinations were                            normal.                           Three sessile polyps were found in the transverse                            colon and ascending colon. The polyps were 5 to 6                            mm in size. These polyps were removed with a cold                            snare. Resection and retrieval were complete.  Multiple medium-mouthed diverticula were found in                            the left colon. There was evidence of diverticular                            spasm. There was no evidence of diverticular                            bleeding.                           The exam was otherwise without abnormality on                            direct and retroflexion views. Complications:            No immediate complications. Estimated blood loss:                            None. Estimated Blood Loss:     Estimated blood loss: none. Impression:               - Three 5 to 6 mm polyps in the transverse colon                            and in the ascending colon, removed with a cold                            snare. Resected and retrieved.                           - Moderate diverticulosis in the left colon. There                            was evidence of diverticular spasm. There was no                             evidence of diverticular bleeding.                           - The examination was otherwise normal on direct                            and retroflexion views. Recommendation:           - Patient has a contact number available for                            emergencies. The signs and symptoms of potential                            delayed complications were discussed with the                            patient. Return to normal activities  tomorrow.                            Written discharge instructions were provided to the                            patient.                           - High fiber diet.                           - Continue present medications.                           - Await pathology results.                           - No repeat colonoscopy due to age. Ladene Artist, MD 12/21/2017 9:57:18 AM This report has been signed electronically.

## 2017-12-21 NOTE — Progress Notes (Signed)
Pt's states no medical or surgical changes since previsit or office visit. 

## 2017-12-22 ENCOUNTER — Telehealth: Payer: Self-pay | Admitting: *Deleted

## 2017-12-22 NOTE — Telephone Encounter (Signed)
  Follow up Call-  Call back number 12/21/2017  Post procedure Call Back phone  # [phone]  Permission to leave phone message Yes  Some recent data might be hidden     Patient questions:  Do you have a fever, pain , or abdominal swelling? No. Pain Score  0 *  Have you tolerated food without any problems? Yes.    Have you been able to return to your normal activities? Yes.    Do you have any questions about your discharge instructions: Diet   No. Medications  No. Follow up visit  No.  Do you have questions or concerns about your Care? No.  Actions: * If pain score is 4 or above: No action needed, pain <4.

## 2017-12-27 ENCOUNTER — Ambulatory Visit (INDEPENDENT_AMBULATORY_CARE_PROVIDER_SITE_OTHER): Payer: Medicare HMO | Admitting: Internal Medicine

## 2017-12-27 ENCOUNTER — Encounter: Payer: Self-pay | Admitting: Internal Medicine

## 2017-12-27 ENCOUNTER — Other Ambulatory Visit (INDEPENDENT_AMBULATORY_CARE_PROVIDER_SITE_OTHER): Payer: Medicare HMO

## 2017-12-27 VITALS — BP 142/68 | HR 64 | Temp 97.8°F | Ht 63.5 in | Wt 240.0 lb

## 2017-12-27 DIAGNOSIS — K219 Gastro-esophageal reflux disease without esophagitis: Secondary | ICD-10-CM | POA: Diagnosis not present

## 2017-12-27 DIAGNOSIS — E1122 Type 2 diabetes mellitus with diabetic chronic kidney disease: Secondary | ICD-10-CM

## 2017-12-27 DIAGNOSIS — N183 Chronic kidney disease, stage 3 unspecified: Secondary | ICD-10-CM

## 2017-12-27 DIAGNOSIS — E538 Deficiency of other specified B group vitamins: Secondary | ICD-10-CM

## 2017-12-27 DIAGNOSIS — Z0000 Encounter for general adult medical examination without abnormal findings: Secondary | ICD-10-CM

## 2017-12-27 DIAGNOSIS — E785 Hyperlipidemia, unspecified: Secondary | ICD-10-CM | POA: Diagnosis not present

## 2017-12-27 LAB — CBC WITH DIFFERENTIAL/PLATELET
BASOS ABS: 0.1 10*3/uL (ref 0.0–0.1)
Basophils Relative: 0.7 % (ref 0.0–3.0)
EOS PCT: 2.3 % (ref 0.0–5.0)
Eosinophils Absolute: 0.2 10*3/uL (ref 0.0–0.7)
HCT: 40.2 % (ref 36.0–46.0)
HEMOGLOBIN: 13.3 g/dL (ref 12.0–15.0)
Lymphocytes Relative: 31.7 % (ref 12.0–46.0)
Lymphs Abs: 2.2 10*3/uL (ref 0.7–4.0)
MCHC: 33 g/dL (ref 30.0–36.0)
MCV: 83.7 fl (ref 78.0–100.0)
MONO ABS: 0.8 10*3/uL (ref 0.1–1.0)
MONOS PCT: 11.6 % (ref 3.0–12.0)
NEUTROS PCT: 53.7 % (ref 43.0–77.0)
Neutro Abs: 3.7 10*3/uL (ref 1.4–7.7)
Platelets: 233 10*3/uL (ref 150.0–400.0)
RBC: 4.8 Mil/uL (ref 3.87–5.11)
RDW: 14.7 % (ref 11.5–15.5)
WBC: 6.8 10*3/uL (ref 4.0–10.5)

## 2017-12-27 LAB — HEPATIC FUNCTION PANEL
ALT: 17 U/L (ref 0–35)
AST: 14 U/L (ref 0–37)
Albumin: 3.6 g/dL (ref 3.5–5.2)
Alkaline Phosphatase: 81 U/L (ref 39–117)
BILIRUBIN TOTAL: 0.6 mg/dL (ref 0.2–1.2)
Bilirubin, Direct: 0.2 mg/dL (ref 0.0–0.3)
Total Protein: 6.9 g/dL (ref 6.0–8.3)

## 2017-12-27 LAB — URINALYSIS, ROUTINE W REFLEX MICROSCOPIC
BILIRUBIN URINE: NEGATIVE
Hgb urine dipstick: NEGATIVE
KETONES UR: NEGATIVE
NITRITE: NEGATIVE
PH: 6 (ref 5.0–8.0)
SPECIFIC GRAVITY, URINE: 1.015 (ref 1.000–1.030)
Total Protein, Urine: NEGATIVE
UROBILINOGEN UA: 0.2 (ref 0.0–1.0)
Urine Glucose: NEGATIVE

## 2017-12-27 LAB — LIPID PANEL
Cholesterol: 136 mg/dL (ref 0–200)
HDL: 37.7 mg/dL — AB (ref >39.00)
LDL CALC: 76 mg/dL (ref 0–99)
NONHDL: 98.07
Total CHOL/HDL Ratio: 4
Triglycerides: 110 mg/dL (ref 0.0–149.0)
VLDL: 22 mg/dL (ref 0.0–40.0)

## 2017-12-27 LAB — BASIC METABOLIC PANEL
BUN: 24 mg/dL — ABNORMAL HIGH (ref 6–23)
CALCIUM: 9.2 mg/dL (ref 8.4–10.5)
CHLORIDE: 104 meq/L (ref 96–112)
CO2: 27 meq/L (ref 19–32)
Creatinine, Ser: 1.37 mg/dL — ABNORMAL HIGH (ref 0.40–1.20)
GFR: 48.26 mL/min — ABNORMAL LOW (ref >60.00)
GLUCOSE: 111 mg/dL — AB (ref 70–99)
Potassium: 4.3 mEq/L (ref 3.5–5.1)
SODIUM: 141 meq/L (ref 135–145)

## 2017-12-27 LAB — MICROALBUMIN / CREATININE URINE RATIO
CREATININE, U: 95.4 mg/dL
MICROALB/CREAT RATIO: 1.2 mg/g (ref 0.0–30.0)
Microalb, Ur: 1.2 mg/dL (ref 0.0–1.9)

## 2017-12-27 LAB — HEMOGLOBIN A1C: HEMOGLOBIN A1C: 6.3 % (ref 4.6–6.5)

## 2017-12-27 LAB — TSH: TSH: 4.45 u[IU]/mL (ref 0.35–4.50)

## 2017-12-27 MED ORDER — ZOSTER VAC RECOMB ADJUVANTED 50 MCG/0.5ML IM SUSR: 0.5000 mL | Freq: Once | INTRAMUSCULAR | 1 refills | Status: AC

## 2017-12-27 NOTE — Assessment & Plan Note (Signed)
Here for medicare wellness/physical  Diet: heart healthy  Physical activity: sedentary  Depression/mood screen: negative  Hearing: intact to whispered voice  Visual acuity: grossly normal, performs annual eye exam  ADLs: capable  Fall risk: low to none  Home safety: good  Cognitive evaluation: intact to orientation, naming, recall and repetition  EOL planning: adv directives, full code/ I agree  I have personally reviewed and have noted  1. The patient's medical, surgical and social history  2. Their use of alcohol, tobacco or illicit drugs  3. Their current medications and supplements  4. The patient's functional ability including ADL's, fall risks, home safety risks and hearing or visual impairment.  5. Diet and physical activities  6. Evidence for depression or mood disorders 7. The roster of all physicians providing medical care to patient - is listed in the Snapshot section of the chart and reviewed today.    Today patient counseled on age appropriate routine health concerns for screening and prevention, each reviewed and up to date or declined. Immunizations reviewed and up to date or declined. Labs ordered and reviewed. Risk factors for depression reviewed and negative. Hearing function and visual acuity are intact. ADLs screened and addressed as needed. Functional ability and level of safety reviewed and appropriate. Education, counseling and referrals performed based on assessed risks today. Patient provided with a copy of personalized plan for preventive services.    Colon OK  Dr Fuller Plan 1/19

## 2017-12-27 NOTE — Assessment & Plan Note (Signed)
Discussed options Will ref to Dr Leafy Ro

## 2017-12-27 NOTE — Patient Instructions (Addendum)
Health Maintenance for Postmenopausal Women Menopause is a normal process in which your reproductive ability comes to an end. This process happens gradually over a span of months to years, usually between the ages of 22 and 9. Menopause is complete when you have missed 12 consecutive menstrual periods. It is important to talk with your health care provider about some of the most common conditions that affect postmenopausal women, such as heart disease, cancer, and bone loss (osteoporosis). Adopting a healthy lifestyle and getting preventive care can help to promote your health and wellness. Those actions can also lower your chances of developing some of these common conditions. What should I know about menopause? During menopause, you may experience a number of symptoms, such as:  Moderate-to-severe hot flashes.  Night sweats.  Decrease in sex drive.  Mood swings.  Headaches.  Tiredness.  Irritability.  Memory problems.  Insomnia.  Choosing to treat or not to treat menopausal changes is an individual decision that you make with your health care provider. What should I know about hormone replacement therapy and supplements? Hormone therapy products are effective for treating symptoms that are associated with menopause, such as hot flashes and night sweats. Hormone replacement carries certain risks, especially as you become older. If you are thinking about using estrogen or estrogen with progestin treatments, discuss the benefits and risks with your health care provider. What should I know about heart disease and stroke? Heart disease, heart attack, and stroke become more likely as you age. This may be due, in part, to the hormonal changes that your body experiences during menopause. These can affect how your body processes dietary fats, triglycerides, and cholesterol. Heart attack and stroke are both medical emergencies. There are many things that you can do to help prevent heart disease  and stroke:  Have your blood pressure checked at least every 1-2 years. High blood pressure causes heart disease and increases the risk of stroke.  If you are 53-22 years old, ask your health care provider if you should take aspirin to prevent a heart attack or a stroke.  Do not use any tobacco products, including cigarettes, chewing tobacco, or electronic cigarettes. If you need help quitting, ask your health care provider.  It is important to eat a healthy diet and maintain a healthy weight. ? Be sure to include plenty of vegetables, fruits, low-fat dairy products, and lean protein. ? Avoid eating foods that are high in solid fats, added sugars, or salt (sodium).  Get regular exercise. This is one of the most important things that you can do for your health. ? Try to exercise for at least 150 minutes each week. The type of exercise that you do should increase your heart rate and make you sweat. This is known as moderate-intensity exercise. ? Try to do strengthening exercises at least twice each week. Do these in addition to the moderate-intensity exercise.  Know your numbers.Ask your health care provider to check your cholesterol and your blood glucose. Continue to have your blood tested as directed by your health care provider.  What should I know about cancer screening? There are several types of cancer. Take the following steps to reduce your risk and to catch any cancer development as early as possible. Breast Cancer  Practice breast self-awareness. ? This means understanding how your breasts normally appear and feel. ? It also means doing regular breast self-exams. Let your health care provider know about any changes, no matter how small.  If you are 40  or older, have a clinician do a breast exam (clinical breast exam or CBE) every year. Depending on your age, family history, and medical history, it may be recommended that you also have a yearly breast X-ray (mammogram).  If you  have a family history of breast cancer, talk with your health care provider about genetic screening.  If you are at high risk for breast cancer, talk with your health care provider about having an MRI and a mammogram every year.  Breast cancer (BRCA) gene test is recommended for women who have family members with BRCA-related cancers. Results of the assessment will determine the need for genetic counseling and BRCA1 and for BRCA2 testing. BRCA-related cancers include these types: ? Breast. This occurs in males or females. ? Ovarian. ? Tubal. This may also be called fallopian tube cancer. ? Cancer of the abdominal or pelvic lining (peritoneal cancer). ? Prostate. ? Pancreatic.  Cervical, Uterine, and Ovarian Cancer Your health care provider may recommend that you be screened regularly for cancer of the pelvic organs. These include your ovaries, uterus, and vagina. This screening involves a pelvic exam, which includes checking for microscopic changes to the surface of your cervix (Pap test).  For women ages 21-65, health care providers may recommend a pelvic exam and a Pap test every three years. For women ages 79-65, they may recommend the Pap test and pelvic exam, combined with testing for human papilloma virus (HPV), every five years. Some types of HPV increase your risk of cervical cancer. Testing for HPV may also be done on women of any age who have unclear Pap test results.  Other health care providers may not recommend any screening for nonpregnant women who are considered low risk for pelvic cancer and have no symptoms. Ask your health care provider if a screening pelvic exam is right for you.  If you have had past treatment for cervical cancer or a condition that could lead to cancer, you need Pap tests and screening for cancer for at least 20 years after your treatment. If Pap tests have been discontinued for you, your risk factors (such as having a new sexual partner) need to be  reassessed to determine if you should start having screenings again. Some women have medical problems that increase the chance of getting cervical cancer. In these cases, your health care provider may recommend that you have screening and Pap tests more often.  If you have a family history of uterine cancer or ovarian cancer, talk with your health care provider about genetic screening.  If you have vaginal bleeding after reaching menopause, tell your health care provider.  There are currently no reliable tests available to screen for ovarian cancer.  Lung Cancer Lung cancer screening is recommended for adults 69-62 years old who are at high risk for lung cancer because of a history of smoking. A yearly low-dose CT scan of the lungs is recommended if you:  Currently smoke.  Have a history of at least 30 pack-years of smoking and you currently smoke or have quit within the past 15 years. A pack-year is smoking an average of one pack of cigarettes per day for one year.  Yearly screening should:  Continue until it has been 15 years since you quit.  Stop if you develop a health problem that would prevent you from having lung cancer treatment.  Colorectal Cancer  This type of cancer can be detected and can often be prevented.  Routine colorectal cancer screening usually begins at  age 42 and continues through age 45.  If you have risk factors for colon cancer, your health care provider may recommend that you be screened at an earlier age.  If you have a family history of colorectal cancer, talk with your health care provider about genetic screening.  Your health care provider may also recommend using home test kits to check for hidden blood in your stool.  A small camera at the end of a tube can be used to examine your colon directly (sigmoidoscopy or colonoscopy). This is done to check for the earliest forms of colorectal cancer.  Direct examination of the colon should be repeated every  5-10 years until age 71. However, if early forms of precancerous polyps or small growths are found or if you have a family history or genetic risk for colorectal cancer, you may need to be screened more often.  Skin Cancer  Check your skin from head to toe regularly.  Monitor any moles. Be sure to tell your health care provider: ? About any new moles or changes in moles, especially if there is a change in a mole's shape or color. ? If you have a mole that is larger than the size of a pencil eraser.  If any of your family members has a history of skin cancer, especially at a young age, talk with your health care provider about genetic screening.  Always use sunscreen. Apply sunscreen liberally and repeatedly throughout the day.  Whenever you are outside, protect yourself by wearing long sleeves, pants, a wide-brimmed hat, and sunglasses.  What should I know about osteoporosis? Osteoporosis is a condition in which bone destruction happens more quickly than new bone creation. After menopause, you may be at an increased risk for osteoporosis. To help prevent osteoporosis or the bone fractures that can happen because of osteoporosis, the following is recommended:  If you are 46-71 years old, get at least 1,000 mg of calcium and at least 600 mg of vitamin D per day.  If you are older than age 55 but younger than age 65, get at least 1,200 mg of calcium and at least 600 mg of vitamin D per day.  If you are older than age 54, get at least 1,200 mg of calcium and at least 800 mg of vitamin D per day.  Smoking and excessive alcohol intake increase the risk of osteoporosis. Eat foods that are rich in calcium and vitamin D, and do weight-bearing exercises several times each week as directed by your health care provider. What should I know about how menopause affects my mental health? Depression may occur at any age, but it is more common as you become older. Common symptoms of depression  include:  Low or sad mood.  Changes in sleep patterns.  Changes in appetite or eating patterns.  Feeling an overall lack of motivation or enjoyment of activities that you previously enjoyed.  Frequent crying spells.  Talk with your health care provider if you think that you are experiencing depression. What should I know about immunizations? It is important that you get and maintain your immunizations. These include:  Tetanus, diphtheria, and pertussis (Tdap) booster vaccine.  Influenza every year before the flu season begins.  Pneumonia vaccine.  Shingles vaccine.  Your health care provider may also recommend other immunizations. This information is not intended to replace advice given to you by your health care provider. Make sure you discuss any questions you have with your health care provider. Document Released: 01/01/2006  Document Revised: 05/29/2016 Document Reviewed: 08/13/2015 Elsevier Interactive Patient Education  2018 [employer]  

## 2017-12-27 NOTE — Assessment & Plan Note (Signed)
On B12 

## 2017-12-27 NOTE — Progress Notes (Signed)
Subjective:  Patient ID: Bridget Oliver, female    DOB: August 29, 1941  Age: 77 y.o. MRN: [id number]  CC: No chief complaint on file.   HPI Bridget Oliver presents for a well exam  F/u HTN, colon polyps - just had a colonoscopy, CRI f/u; obesity  Outpatient Medications Prior to Visit  Medication Sig Dispense Refill  . acetaminophen (TYLENOL) 325 MG tablet Take 2 tablets (650 mg total) by mouth every 6 (six) hours as needed for moderate pain. 30 tablet 0  . acetaminophen-codeine (TYLENOL #3) 300-30 MG tablet Take 1 tablet by mouth every 8 (eight) hours as needed for moderate pain or severe pain. 30 tablet 1  . atorvastatin (LIPITOR) 40 MG tablet Take 1 tablet (40 mg total) by mouth daily. 90 tablet 1  . carvedilol (COREG) 25 MG tablet TAKE 1 TABLET TWICE DAILY WITH A MEAL 180 tablet 1  . Cholecalciferol (VITAMIN D3) 2000 UNITS capsule Take 1 capsule (2,000 Units total) by mouth daily. 100 capsule 3  . Coenzyme Q10 (COQ10) 100 MG CAPS Take by mouth.    . diclofenac sodium (VOLTAREN) 1 % GEL Apply 4 g topically 4 (four) times daily. 1 Tube 3  . Febuxostat (ULORIC) 80 MG TABS Take 1 tablet (80 mg total) by mouth daily. 90 tablet 1  . Fish Oil-Cholecalciferol (FISH OIL + D3) 1200-1000 MG-UNIT CAPS Take by mouth.    . furosemide (LASIX) 80 MG tablet Take 1 tablet (80 mg total) by mouth daily. 90 tablet 1  . hydrALAZINE (APRESOLINE) 25 MG tablet Take 1 tablet (25 mg total) by mouth 3 (three) times daily. 270 tablet 1  . Multiple Vitamins-Minerals (CENTRUM SILVER PO) Take 1 tablet by mouth daily.     . nitroGLYCERIN (NITROSTAT) 0.4 MG SL tablet Place 0.4 mg under the tongue every 5 (five) minutes as needed for chest pain. Reported on 05/28/2016    . Polyethyl Glycol-Propyl Glycol (SYSTANE FREE OP) Place 1 drop into both eyes daily as needed.    . potassium chloride SA (K-DUR,KLOR-CON) 20 MEQ tablet Take 1 tablet (20 mEq total) by mouth daily. 90 tablet 3  . tiZANidine (ZANAFLEX) 4 MG tablet Take 1 tablet  (4 mg total) by mouth every 8 (eight) hours as needed for muscle spasms. 30 tablet 0  . vitamin B-12 (CYANOCOBALAMIN) 1000 MCG tablet Take 500 mcg by mouth every other day.     . vitamin C (ASCORBIC ACID) 500 MG tablet Take 500 mg by mouth daily as needed (for cold).      Facility-Administered Medications Prior to Visit  Medication Dose Route Frequency Provider Last Rate Last Dose  . 0.9 %  sodium chloride infusion  500 mL Intravenous Once Ladene Artist, MD        ROS Review of Systems  Constitutional: Negative for activity change, appetite change, chills, fatigue and unexpected weight change.  HENT: Negative for congestion, mouth sores and sinus pressure.   Eyes: Negative for visual disturbance.  Respiratory: Negative for cough and chest tightness.   Gastrointestinal: Negative for abdominal pain and nausea.  Genitourinary: Negative for difficulty urinating, frequency and vaginal pain.  Musculoskeletal: Negative for back pain and gait problem.  Skin: Negative for pallor and rash.  Neurological: Negative for dizziness, tremors, weakness, numbness and headaches.  Psychiatric/Behavioral: Negative for confusion and sleep disturbance.    Objective:  BP (!) 142/68 (BP Location: Left Arm, Patient Position: Sitting, Cuff Size: Large)   Pulse 64   Temp 97.8 F (36.6  C) (Oral)   Ht 5' 3.5" (1.613 m)   Wt 240 lb (108.9 kg)   SpO2 100%   BMI 41.85 kg/m   BP Readings from Last 3 Encounters:  12/27/17 (!) 142/68  12/21/17 (!) 172/46  06/24/17 136/72    Wt Readings from Last 3 Encounters:  12/27/17 240 lb (108.9 kg)  12/21/17 239 lb (108.4 kg)  12/13/17 239 lb (108.4 kg)    Physical Exam  Constitutional: She appears well-developed. No distress.  HENT:  Head: Normocephalic.  Right Ear: External ear normal.  Left Ear: External ear normal.  Nose: Nose normal.  Mouth/Throat: Oropharynx is clear and moist.  Eyes: Conjunctivae are normal. Pupils are equal, round, and reactive to  light. Right eye exhibits no discharge. Left eye exhibits no discharge.  Neck: Normal range of motion. Neck supple. No JVD present. No tracheal deviation present. No thyromegaly present.  Cardiovascular: Normal rate, regular rhythm and normal heart sounds.  Pulmonary/Chest: No stridor. No respiratory distress. She has no wheezes.  Abdominal: Soft. Bowel sounds are normal. She exhibits no distension and no mass. There is no tenderness. There is no rebound and no guarding.  Musculoskeletal: She exhibits no edema or tenderness.  Lymphadenopathy:    She has no cervical adenopathy.  Neurological: She displays normal reflexes. No cranial nerve deficit. She exhibits normal muscle tone. Coordination normal.  Skin: No rash noted. No erythema.  Psychiatric: She has a normal mood and affect. Her behavior is normal. Judgment and thought content normal.  obese  Lab Results  Component Value Date   WBC 7.1 06/24/2017   HGB 13.6 06/24/2017   HCT 41.6 06/24/2017   PLT 232.0 06/24/2017   GLUCOSE 103 (H) 06/24/2017   CHOL 137 06/24/2017   TRIG 124.0 06/24/2017   HDL 29.20 (L) 06/24/2017   LDLDIRECT 127.9 07/14/2011   LDLCALC 83 06/24/2017   ALT 18 06/24/2017   AST 17 06/24/2017   NA 136 06/24/2017   K 4.1 06/24/2017   CL 104 06/24/2017   CREATININE 1.28 (H) 06/24/2017   BUN 19 06/24/2017   CO2 27 06/24/2017   TSH 2.59 12/21/2016   INR 0.98 06/07/2014   HGBA1C 6.3 06/24/2017    US Carotid Bilateral  Result Date: 08/04/2016 CLINICAL DATA:  Bilateral carotid atherosclerosis EXAM: BILATERAL CAROTID DUPLEX ULTRASOUND TECHNIQUE: Pearline Cables scale imaging, color Doppler and duplex ultrasound were performed of bilateral carotid and vertebral arteries in the neck. COMPARISON:  03/08/2013 FINDINGS: Criteria: Quantification of carotid stenosis is based on velocity parameters that correlate the residual internal carotid diameter with NASCET-based stenosis levels, using the diameter of the distal internal carotid  lumen as the denominator for stenosis measurement. The following velocity measurements were obtained: RIGHT ICA:  91/24 cm/sec CCA:  740/81 cm/sec SYSTOLIC ICA/CCA RATIO:  0.7 DIASTOLIC ICA/CCA RATIO:  0.9 ECA:  116 cm/sec LEFT ICA:  156/28 cm/sec CCA:  448/18 cm/sec SYSTOLIC ICA/CCA RATIO:  1.2 DIASTOLIC ICA/CCA RATIO:  1.0 ECA:  133 cm/sec RIGHT CAROTID ARTERY: Minor echogenic shadowing plaque formation. No hemodynamically significant right ICA stenosis, velocity elevation, or turbulent flow. Degree of narrowing less than 50%. RIGHT VERTEBRAL ARTERY:  Antegrade LEFT CAROTID ARTERY: Heterogeneous partially calcified left carotid system atherosclerosis. This narrows the proximal ICA lumen by grayscale imaging. Mild velocity elevation measuring 156/28 cm/sec. No significant turbulent flow. Degree of stenosis is estimated at the 50- 69% range (lower end of the range). LEFT VERTEBRAL ARTERY:  Antegrade IMPRESSION: Left greater than right carotid atherosclerosis. Minor right ICA narrowing,  less than 50% Left ICA moderate stenosis estimated at 50- 69% (lower end of the range). Patent antegrade vertebral flow bilaterally Electronically Signed   By: Jerilynn Mages.  Shick M.D.   On: 08/04/2016 17:07    Assessment & Plan:     Follow-up: No Follow-up on file.  Walker Kehr, MD

## 2017-12-27 NOTE — Assessment & Plan Note (Signed)
Labs

## 2017-12-27 NOTE — Assessment & Plan Note (Signed)
TUMS prn

## 2018-01-06 ENCOUNTER — Encounter: Payer: Self-pay | Admitting: Gastroenterology

## 2018-02-23 DIAGNOSIS — M109 Gout, unspecified: Secondary | ICD-10-CM | POA: Diagnosis not present

## 2018-02-23 DIAGNOSIS — E785 Hyperlipidemia, unspecified: Secondary | ICD-10-CM | POA: Diagnosis not present

## 2018-02-23 DIAGNOSIS — Z6841 Body Mass Index (BMI) 40.0 and over, adult: Secondary | ICD-10-CM | POA: Diagnosis not present

## 2018-02-23 DIAGNOSIS — K219 Gastro-esophageal reflux disease without esophagitis: Secondary | ICD-10-CM | POA: Diagnosis not present

## 2018-02-23 DIAGNOSIS — N189 Chronic kidney disease, unspecified: Secondary | ICD-10-CM | POA: Diagnosis not present

## 2018-02-23 DIAGNOSIS — H04129 Dry eye syndrome of unspecified lacrimal gland: Secondary | ICD-10-CM | POA: Diagnosis not present

## 2018-02-23 DIAGNOSIS — Z809 Family history of malignant neoplasm, unspecified: Secondary | ICD-10-CM | POA: Diagnosis not present

## 2018-02-23 DIAGNOSIS — I129 Hypertensive chronic kidney disease with stage 1 through stage 4 chronic kidney disease, or unspecified chronic kidney disease: Secondary | ICD-10-CM | POA: Diagnosis not present

## 2018-02-23 DIAGNOSIS — Z8249 Family history of ischemic heart disease and other diseases of the circulatory system: Secondary | ICD-10-CM | POA: Diagnosis not present

## 2018-03-16 DIAGNOSIS — Z780 Asymptomatic menopausal state: Secondary | ICD-10-CM | POA: Diagnosis not present

## 2018-03-16 DIAGNOSIS — Z1231 Encounter for screening mammogram for malignant neoplasm of breast: Secondary | ICD-10-CM | POA: Diagnosis not present

## 2018-03-16 LAB — HM DEXA SCAN: HM Dexa Scan: NORMAL

## 2018-03-22 ENCOUNTER — Encounter: Payer: Self-pay | Admitting: Internal Medicine

## 2018-03-28 DIAGNOSIS — Z96651 Presence of right artificial knee joint: Secondary | ICD-10-CM | POA: Diagnosis not present

## 2018-03-28 DIAGNOSIS — R69 Illness, unspecified: Secondary | ICD-10-CM | POA: Diagnosis not present

## 2018-03-28 DIAGNOSIS — R2681 Unsteadiness on feet: Secondary | ICD-10-CM | POA: Diagnosis not present

## 2018-04-26 ENCOUNTER — Encounter: Payer: Self-pay | Admitting: Internal Medicine

## 2018-05-16 DIAGNOSIS — H40023 Open angle with borderline findings, high risk, bilateral: Secondary | ICD-10-CM | POA: Diagnosis not present

## 2018-05-16 DIAGNOSIS — H04123 Dry eye syndrome of bilateral lacrimal glands: Secondary | ICD-10-CM | POA: Diagnosis not present

## 2018-06-17 ENCOUNTER — Other Ambulatory Visit: Payer: Self-pay | Admitting: Internal Medicine

## 2018-06-27 ENCOUNTER — Other Ambulatory Visit (INDEPENDENT_AMBULATORY_CARE_PROVIDER_SITE_OTHER): Payer: Medicare HMO

## 2018-06-27 ENCOUNTER — Other Ambulatory Visit: Payer: Self-pay | Admitting: Internal Medicine

## 2018-06-27 ENCOUNTER — Ambulatory Visit (INDEPENDENT_AMBULATORY_CARE_PROVIDER_SITE_OTHER)
Admission: RE | Admit: 2018-06-27 | Discharge: 2018-06-27 | Disposition: A | Discharge status: Home / Self Care | Payer: Medicare HMO | Source: Ambulatory Visit | Attending: Internal Medicine | Admitting: Internal Medicine

## 2018-06-27 ENCOUNTER — Encounter: Payer: Self-pay | Admitting: Internal Medicine

## 2018-06-27 ENCOUNTER — Ambulatory Visit (INDEPENDENT_AMBULATORY_CARE_PROVIDER_SITE_OTHER): Payer: Medicare HMO | Admitting: Internal Medicine

## 2018-06-27 VITALS — BP 144/68 | HR 67 | Temp 97.6°F | Ht 63.5 in | Wt 239.0 lb

## 2018-06-27 DIAGNOSIS — R079 Chest pain, unspecified: Secondary | ICD-10-CM

## 2018-06-27 DIAGNOSIS — I129 Hypertensive chronic kidney disease with stage 1 through stage 4 chronic kidney disease, or unspecified chronic kidney disease: Secondary | ICD-10-CM | POA: Diagnosis not present

## 2018-06-27 DIAGNOSIS — E1122 Type 2 diabetes mellitus with diabetic chronic kidney disease: Secondary | ICD-10-CM

## 2018-06-27 DIAGNOSIS — N183 Chronic kidney disease, stage 3 unspecified: Secondary | ICD-10-CM

## 2018-06-27 DIAGNOSIS — M1000 Idiopathic gout, unspecified site: Secondary | ICD-10-CM | POA: Diagnosis not present

## 2018-06-27 DIAGNOSIS — R059 Cough, unspecified: Secondary | ICD-10-CM | POA: Insufficient documentation

## 2018-06-27 DIAGNOSIS — R05 Cough: Secondary | ICD-10-CM

## 2018-06-27 DIAGNOSIS — K219 Gastro-esophageal reflux disease without esophagitis: Secondary | ICD-10-CM

## 2018-06-27 DIAGNOSIS — E538 Deficiency of other specified B group vitamins: Secondary | ICD-10-CM

## 2018-06-27 LAB — CBC WITH DIFFERENTIAL/PLATELET
BASOS PCT: 0.9 % (ref 0.0–3.0)
Basophils Absolute: 0.1 10*3/uL (ref 0.0–0.1)
EOS ABS: 0.1 10*3/uL (ref 0.0–0.7)
EOS PCT: 1.7 % (ref 0.0–5.0)
HEMATOCRIT: 40.7 % (ref 36.0–46.0)
Hemoglobin: 13.4 g/dL (ref 12.0–15.0)
LYMPHS PCT: 25.7 % (ref 12.0–46.0)
Lymphs Abs: 1.7 10*3/uL (ref 0.7–4.0)
MCHC: 33 g/dL (ref 30.0–36.0)
MCV: 82.3 fl (ref 78.0–100.0)
Monocytes Absolute: 0.8 10*3/uL (ref 0.1–1.0)
Monocytes Relative: 12.5 % — ABNORMAL HIGH (ref 3.0–12.0)
Neutro Abs: 3.9 10*3/uL (ref 1.4–7.7)
Neutrophils Relative %: 59.2 % (ref 43.0–77.0)
PLATELETS: 224 10*3/uL (ref 150.0–400.0)
RBC: 4.94 Mil/uL (ref 3.87–5.11)
RDW: 14.4 % (ref 11.5–15.5)
WBC: 6.6 10*3/uL (ref 4.0–10.5)

## 2018-06-27 LAB — HEPATIC FUNCTION PANEL
ALT: 17 U/L (ref 0–35)
AST: 17 U/L (ref 0–37)
Albumin: 3.7 g/dL (ref 3.5–5.2)
Alkaline Phosphatase: 72 U/L (ref 39–117)
BILIRUBIN DIRECT: 0.1 mg/dL (ref 0.0–0.3)
BILIRUBIN TOTAL: 0.6 mg/dL (ref 0.2–1.2)
Total Protein: 6.8 g/dL (ref 6.0–8.3)

## 2018-06-27 LAB — URINALYSIS
BILIRUBIN URINE: NEGATIVE
HGB URINE DIPSTICK: NEGATIVE
KETONES UR: NEGATIVE
LEUKOCYTES UA: NEGATIVE
NITRITE: NEGATIVE
PH: 5.5 (ref 5.0–8.0)
Specific Gravity, Urine: 1.02 (ref 1.000–1.030)
TOTAL PROTEIN, URINE-UPE24: NEGATIVE
Urine Glucose: NEGATIVE
Urobilinogen, UA: 0.2 (ref 0.0–1.0)

## 2018-06-27 LAB — LIPID PANEL
CHOL/HDL RATIO: 5
Cholesterol: 137 mg/dL (ref 0–200)
HDL: 28.6 mg/dL — ABNORMAL LOW (ref >39.00)
LDL Cholesterol: 81 mg/dL (ref 0–99)
NonHDL: 108.64
TRIGLYCERIDES: 139 mg/dL (ref 0.0–149.0)
VLDL: 27.8 mg/dL (ref 0.0–40.0)

## 2018-06-27 LAB — TSH: TSH: 3.01 u[IU]/mL (ref 0.35–4.50)

## 2018-06-27 LAB — HEMOGLOBIN A1C: HEMOGLOBIN A1C: 6.3 % (ref 4.6–6.5)

## 2018-06-27 LAB — BASIC METABOLIC PANEL
BUN: 22 mg/dL (ref 6–23)
CALCIUM: 9.4 mg/dL (ref 8.4–10.5)
CO2: 26 mEq/L (ref 19–32)
Chloride: 105 mEq/L (ref 96–112)
Creatinine, Ser: 1.51 mg/dL — ABNORMAL HIGH (ref 0.40–1.20)
GFR: 43.07 mL/min — AB (ref >60.00)
GLUCOSE: 104 mg/dL — AB (ref 70–99)
POTASSIUM: 4.2 meq/L (ref 3.5–5.1)
Sodium: 140 mEq/L (ref 135–145)

## 2018-06-27 LAB — URIC ACID: URIC ACID, SERUM: 2.4 mg/dL (ref 2.4–7.0)

## 2018-06-27 MED ORDER — ZOSTER VAC RECOMB ADJUVANTED 50 MCG/0.5ML IM SUSR: 0.5000 mL | Freq: Once | INTRAMUSCULAR | 1 refills | Status: AC

## 2018-06-27 MED ORDER — ALLOPURINOL 100 MG PO TABS: 50.0000 mg | ORAL_TABLET | Freq: Every day | ORAL | 6 refills | Status: DC

## 2018-06-27 MED ORDER — PANTOPRAZOLE SODIUM 40 MG PO TBEC: 40.0000 mg | DELAYED_RELEASE_TABLET | Freq: Every day | ORAL | 11 refills | Status: DC

## 2018-06-27 NOTE — Assessment & Plan Note (Addendum)
CXR Cardiol ref Empiric Protonix

## 2018-06-27 NOTE — Assessment & Plan Note (Signed)
On B12 

## 2018-06-27 NOTE — Assessment & Plan Note (Signed)
Uloric - d/c. srart low dose allopurinol

## 2018-06-27 NOTE — Patient Instructions (Signed)
Stop ULORIC

## 2018-06-27 NOTE — Assessment & Plan Note (Signed)
Wt Readings from Last 3 Encounters:  06/27/18 239 lb (108.4 kg)  12/27/17 240 lb (108.9 kg)  12/21/17 239 lb (108.4 kg)

## 2018-06-27 NOTE — Assessment & Plan Note (Signed)
occ sx's

## 2018-06-27 NOTE — Assessment & Plan Note (Signed)
Labs

## 2018-06-27 NOTE — Progress Notes (Signed)
Subjective:  Patient ID: Bridget Oliver, female    DOB: Mar 29, 1941  Age: 77 y.o. MRN: [id number]  CC: No chief complaint on file.   HPI Bridget Oliver presents for dyslipidemia, gout C/o coughing spells off and on C/o CP off and on, no pattern. Better w/ASA  Outpatient Medications Prior to Visit  Medication Sig Dispense Refill  . acetaminophen (TYLENOL) 325 MG tablet Take 2 tablets (650 mg total) by mouth every 6 (six) hours as needed for moderate pain. 30 tablet 0  . acetaminophen-codeine (TYLENOL #3) 300-30 MG tablet Take 1 tablet by mouth every 8 (eight) hours as needed for moderate pain or severe pain. 30 tablet 1  . atorvastatin (LIPITOR) 40 MG tablet TAKE 1 TABLET DAILY 90 tablet 1  . carvedilol (COREG) 25 MG tablet TAKE 1 TABLET TWICE DAILY  WITH MEALS 180 tablet 1  . Cholecalciferol (VITAMIN D3) 2000 UNITS capsule Take 1 capsule (2,000 Units total) by mouth daily. 100 capsule 3  . Coenzyme Q10 (COQ10) 100 MG CAPS Take by mouth.    . diclofenac sodium (VOLTAREN) 1 % GEL Apply 4 g topically 4 (four) times daily. 1 Tube 3  . Fish Oil-Cholecalciferol (FISH OIL + D3) 1200-1000 MG-UNIT CAPS Take by mouth.    . furosemide (LASIX) 80 MG tablet TAKE 1 TABLET DAILY 90 tablet 1  . hydrALAZINE (APRESOLINE) 25 MG tablet TAKE 1 TABLET 3 TIMES A DAY 270 tablet 1  . Multiple Vitamins-Minerals (CENTRUM SILVER PO) Take 1 tablet by mouth daily.     . nitroGLYCERIN (NITROSTAT) 0.4 MG SL tablet Place 0.4 mg under the tongue every 5 (five) minutes as needed for chest pain. Reported on 05/28/2016    . Polyethyl Glycol-Propyl Glycol (SYSTANE FREE OP) Place 1 drop into both eyes daily as needed.    . potassium chloride SA (K-DUR,KLOR-CON) 20 MEQ tablet Take 1 tablet (20 mEq total) by mouth daily. 90 tablet 3  . tiZANidine (ZANAFLEX) 4 MG tablet Take 1 tablet (4 mg total) by mouth every 8 (eight) hours as needed for muscle spasms. 30 tablet 0  . ULORIC 80 MG TABS TAKE 1 TABLET DAILY 90 tablet 1  . vitamin B-12  (CYANOCOBALAMIN) 1000 MCG tablet Take 500 mcg by mouth every other day.     . vitamin C (ASCORBIC ACID) 500 MG tablet Take 500 mg by mouth daily as needed (for cold).      Facility-Administered Medications Prior to Visit  Medication Dose Route Frequency Provider Last Rate Last Dose  . 0.9 %  sodium chloride infusion  500 mL Intravenous Once Ladene Artist, MD        ROS: Review of Systems  Constitutional: Negative for activity change, appetite change, chills, fatigue and unexpected weight change.  HENT: Negative for congestion, mouth sores and sinus pressure.   Eyes: Negative for visual disturbance.  Respiratory: Positive for cough. Negative for chest tightness.   Cardiovascular: Positive for chest pain.  Gastrointestinal: Negative for abdominal pain and nausea.  Genitourinary: Negative for difficulty urinating, frequency and vaginal pain.  Musculoskeletal: Positive for arthralgias. Negative for back pain and gait problem.  Skin: Negative for pallor and rash.  Neurological: Negative for dizziness, tremors, weakness, numbness and headaches.  Psychiatric/Behavioral: Negative for confusion and sleep disturbance.  obese  Objective:  BP (!) 144/68 (BP Location: Left Arm, Patient Position: Sitting, Cuff Size: Large)   Pulse 67   Temp 97.6 F (36.4 C) (Oral)   Ht 5' 3.5" (1.613 m)  Wt 239 lb (108.4 kg)   SpO2 95%   BMI 41.67 kg/m   BP Readings from Last 3 Encounters:  06/27/18 (!) 144/68  12/27/17 (!) 142/68  12/21/17 (!) 172/46    Wt Readings from Last 3 Encounters:  06/27/18 239 lb (108.4 kg)  12/27/17 240 lb (108.9 kg)  12/21/17 239 lb (108.4 kg)    Physical Exam  Constitutional: She appears well-developed. No distress.  HENT:  Head: Normocephalic.  Right Ear: External ear normal.  Left Ear: External ear normal.  Nose: Nose normal.  Mouth/Throat: Oropharynx is clear and moist.  Eyes: Pupils are equal, round, and reactive to light. Conjunctivae are normal. Right  eye exhibits no discharge. Left eye exhibits no discharge.  Neck: Normal range of motion. Neck supple. No JVD present. No tracheal deviation present. No thyromegaly present.  Cardiovascular: Normal rate, regular rhythm and normal heart sounds.  Pulmonary/Chest: No stridor. No respiratory distress. She has no wheezes.  Abdominal: Soft. Bowel sounds are normal. She exhibits no distension and no mass. There is no tenderness. There is no rebound and no guarding.  Musculoskeletal: She exhibits no edema or tenderness.  Lymphadenopathy:    She has no cervical adenopathy.  Neurological: She displays normal reflexes. No cranial nerve deficit. She exhibits normal muscle tone. Coordination normal.  Skin: No rash noted. No erythema.  Psychiatric: She has a normal mood and affect. Her behavior is normal. Judgment and thought content normal.    Lab Results  Component Value Date   WBC 6.8 12/27/2017   HGB 13.3 12/27/2017   HCT 40.2 12/27/2017   PLT 233.0 12/27/2017   GLUCOSE 111 (H) 12/27/2017   CHOL 136 12/27/2017   TRIG 110.0 12/27/2017   HDL 37.70 (L) 12/27/2017   LDLDIRECT 127.9 07/14/2011   LDLCALC 76 12/27/2017   ALT 17 12/27/2017   AST 14 12/27/2017   NA 141 12/27/2017   K 4.3 12/27/2017   CL 104 12/27/2017   CREATININE 1.37 (H) 12/27/2017   BUN 24 (H) 12/27/2017   CO2 27 12/27/2017   TSH 4.45 12/27/2017   INR 0.98 06/07/2014   HGBA1C 6.3 12/27/2017   MICROALBUR 1.2 12/27/2017    US Carotid Bilateral  Result Date: 08/04/2016 CLINICAL DATA:  Bilateral carotid atherosclerosis EXAM: BILATERAL CAROTID DUPLEX ULTRASOUND TECHNIQUE: Pearline Cables scale imaging, color Doppler and duplex ultrasound were performed of bilateral carotid and vertebral arteries in the neck. COMPARISON:  03/08/2013 FINDINGS: Criteria: Quantification of carotid stenosis is based on velocity parameters that correlate the residual internal carotid diameter with NASCET-based stenosis levels, using the diameter of the distal  internal carotid lumen as the denominator for stenosis measurement. The following velocity measurements were obtained: RIGHT ICA:  91/24 cm/sec CCA:  027/25 cm/sec SYSTOLIC ICA/CCA RATIO:  0.7 DIASTOLIC ICA/CCA RATIO:  0.9 ECA:  116 cm/sec LEFT ICA:  156/28 cm/sec CCA:  366/44 cm/sec SYSTOLIC ICA/CCA RATIO:  1.2 DIASTOLIC ICA/CCA RATIO:  1.0 ECA:  133 cm/sec RIGHT CAROTID ARTERY: Minor echogenic shadowing plaque formation. No hemodynamically significant right ICA stenosis, velocity elevation, or turbulent flow. Degree of narrowing less than 50%. RIGHT VERTEBRAL ARTERY:  Antegrade LEFT CAROTID ARTERY: Heterogeneous partially calcified left carotid system atherosclerosis. This narrows the proximal ICA lumen by grayscale imaging. Mild velocity elevation measuring 156/28 cm/sec. No significant turbulent flow. Degree of stenosis is estimated at the 50- 69% range (lower end of the range). LEFT VERTEBRAL ARTERY:  Antegrade IMPRESSION: Left greater than right carotid atherosclerosis. Minor right ICA narrowing, less than 50% Left  ICA moderate stenosis estimated at 50- 69% (lower end of the range). Patent antegrade vertebral flow bilaterally Electronically Signed   By: Jerilynn Mages.  Shick M.D.   On: 08/04/2016 17:07    Assessment & Plan:   There are no diagnoses linked to this encounter.   No orders of the defined types were placed in this encounter.    Follow-up: No follow-ups on file.  Walker Kehr, MD

## 2018-06-27 NOTE — Assessment & Plan Note (Addendum)
CXR  Empiric Protonix

## 2018-07-20 DIAGNOSIS — Z8739 Personal history of other diseases of the musculoskeletal system and connective tissue: Secondary | ICD-10-CM | POA: Diagnosis not present

## 2018-07-20 DIAGNOSIS — N2581 Secondary hyperparathyroidism of renal origin: Secondary | ICD-10-CM | POA: Diagnosis not present

## 2018-07-20 DIAGNOSIS — I129 Hypertensive chronic kidney disease with stage 1 through stage 4 chronic kidney disease, or unspecified chronic kidney disease: Secondary | ICD-10-CM | POA: Diagnosis not present

## 2018-07-20 DIAGNOSIS — N183 Chronic kidney disease, stage 3 (moderate): Secondary | ICD-10-CM | POA: Diagnosis not present

## 2018-07-20 DIAGNOSIS — E1122 Type 2 diabetes mellitus with diabetic chronic kidney disease: Secondary | ICD-10-CM | POA: Diagnosis not present

## 2018-07-20 DIAGNOSIS — D631 Anemia in chronic kidney disease: Secondary | ICD-10-CM | POA: Diagnosis not present

## 2018-07-20 DIAGNOSIS — E785 Hyperlipidemia, unspecified: Secondary | ICD-10-CM | POA: Diagnosis not present

## 2018-08-08 ENCOUNTER — Ambulatory Visit: Payer: Medicare HMO | Admitting: Internal Medicine

## 2018-08-08 ENCOUNTER — Encounter: Payer: Self-pay | Admitting: Internal Medicine

## 2018-08-08 VITALS — BP 148/60 | HR 61 | Ht 63.0 in | Wt 238.6 lb

## 2018-08-08 DIAGNOSIS — R0789 Other chest pain: Secondary | ICD-10-CM | POA: Diagnosis not present

## 2018-08-08 DIAGNOSIS — I10 Essential (primary) hypertension: Secondary | ICD-10-CM | POA: Diagnosis not present

## 2018-08-08 DIAGNOSIS — E782 Mixed hyperlipidemia: Secondary | ICD-10-CM | POA: Insufficient documentation

## 2018-08-08 NOTE — Progress Notes (Signed)
OFFICE CONSULT NOTE  Chief Complaint:  Chest pain  Primary Care Physician: [name], [name], MD  HPI:  Bridget Oliver is a 77 y.o. female who is being seen today for the evaluation of chest pain at the request of [name], [name], MD. This is a 77 year old female who previously saw Bridget Oliver in the hospital in 2015 for chest pain.  This was felt to be atypical at the time and she underwent Lexiscan Myoview stress testing which was negative for ischemia.  Recently she describes chest pain which is at rest.  She says is worse laying down or being in bed but not associated with exertion.  She said she could take aspirin with complete resolution of her symptoms.  It was also felt that she might have reflux and she was started on Protonix.  She says she has had no further episodes since that was started a few months ago.  She also struggles from gout and has high blood pressure which is primarily on her mother side of the family as well as dyslipidemia on statin.  Family history is also significant for lung and pancreatic cancer.  Currently she says her chest pain symptoms have completely resolved.  PMHx:  Past Medical History:  Diagnosis Date  . CRI (chronic renal insufficiency)    Stage 3  . Diabetes mellitus 2011   Type II diet controlled  . Diarrhea   . GERD (gastroesophageal reflux disease)   . Gout   . Hyperglycemia   . Hyperlipidemia   . Hypertension   . Lactose intolerance   . Macular degeneration   . Obesity   . Swelling of lower limb     Past Surgical History:  Procedure Laterality Date  . ABDOMINAL HYSTERECTOMY  1985  . APPENDECTOMY  1985  . CATARACT EXTRACTION  2010 & 2011   bilateral  . Relampago  . LUMBAR LAMINECTOMY  2007  . PARS PLANA VITRECTOMY W/ REPAIR OF MACULAR HOLE  2010 & 2011   Bilateral    FAMHx:  Family History  Problem Relation Age of Onset  . Hypertension Mother   . Hypertension Father   . Cancer Other        Lung  . Colon  cancer Neg Hx   . Esophageal cancer Neg Hx   . Stomach cancer Neg Hx   . Rectal cancer Neg Hx     SOCHx:   reports that she has never smoked. She has never used smokeless tobacco. She reports that she drinks alcohol. She reports that she does not use drugs.  ALLERGIES:  Allergies  Allergen Reactions  . Oxycodone     Nausea Can take codein    ROS: Pertinent items noted in HPI and remainder of comprehensive ROS otherwise negative.  HOME MEDS: Current Outpatient Medications on File Prior to Visit  Medication Sig Dispense Refill  . acetaminophen (TYLENOL) 325 MG tablet Take 2 tablets (650 mg total) by mouth every 6 (six) hours as needed for moderate pain. 30 tablet 0  . acetaminophen-codeine (TYLENOL #3) 300-30 MG tablet Take 1 tablet by mouth every 8 (eight) hours as needed for moderate pain or severe pain. 30 tablet 1  . allopurinol (ZYLOPRIM) 100 MG tablet Take 0.5 tablets (50 mg total) by mouth daily. 30 tablet 6  . atorvastatin (LIPITOR) 40 MG tablet TAKE 1 TABLET DAILY 90 tablet 1  . carvedilol (COREG) 25 MG tablet TAKE 1 TABLET TWICE DAILY  WITH MEALS 180 tablet  1  . Cholecalciferol (VITAMIN D3) 2000 UNITS capsule Take 1 capsule (2,000 Units total) by mouth daily. 100 capsule 3  . Coenzyme Q10 (COQ10) 100 MG CAPS Take by mouth.    . diclofenac sodium (VOLTAREN) 1 % GEL Apply 4 g topically 4 (four) times daily. 1 Tube 3  . Fish Oil-Cholecalciferol (FISH OIL + D3) 1200-1000 MG-UNIT CAPS Take by mouth.    . furosemide (LASIX) 80 MG tablet TAKE 1 TABLET DAILY 90 tablet 1  . hydrALAZINE (APRESOLINE) 25 MG tablet TAKE 1 TABLET 3 TIMES A DAY 270 tablet 1  . losartan (COZAAR) 50 MG tablet Take 1 tablet by mouth daily.  6  . Multiple Vitamins-Minerals (CENTRUM SILVER PO) Take 1 tablet by mouth daily.     . nitroGLYCERIN (NITROSTAT) 0.4 MG SL tablet Place 0.4 mg under the tongue every 5 (five) minutes as needed for chest pain. Reported on 05/28/2016    . pantoprazole (PROTONIX) 40 MG  tablet Take 1 tablet (40 mg total) by mouth daily. 30 tablet 11  . Polyethyl Glycol-Propyl Glycol (SYSTANE FREE OP) Place 1 drop into both eyes daily as needed.    . potassium chloride SA (K-DUR,KLOR-CON) 20 MEQ tablet Take 1 tablet (20 mEq total) by mouth daily. 90 tablet 3  . tiZANidine (ZANAFLEX) 4 MG tablet Take 1 tablet (4 mg total) by mouth every 8 (eight) hours as needed for muscle spasms. 30 tablet 0  . vitamin B-12 (CYANOCOBALAMIN) 1000 MCG tablet Take 500 mcg by mouth every other day.     . vitamin C (ASCORBIC ACID) 500 MG tablet Take 500 mg by mouth daily as needed (for cold).      No current facility-administered medications on file prior to visit.     LABS/IMAGING: No results found for this or any previous visit (from the past 48 hour(s)). No results found.  LIPID PANEL:    Component Value Date/Time   CHOL 137 06/27/2018 0928   TRIG 139.0 06/27/2018 0928   HDL 28.60 (L) 06/27/2018 0928   CHOLHDL 5 06/27/2018 0928   VLDL 27.8 06/27/2018 0928   LDLCALC 81 06/27/2018 0928   LDLDIRECT 127.9 07/14/2011 0747    WEIGHTS: Wt Readings from Last 3 Encounters:  08/08/18 238 lb 9.6 oz (108.2 kg)  06/27/18 239 lb (108.4 kg)  12/27/17 240 lb (108.9 kg)    VITALS: BP (!) 148/60 (BP Location: Right Arm, Patient Position: Sitting, Cuff Size: Large)   Pulse 61   Ht 5\' 3"  (1.6 m)   Wt 238 lb 9.6 oz (108.2 kg)   BMI 42.27 kg/m   EXAM: General appearance: alert, no distress and morbidly obese Neck: no carotid bruit, no JVD and thyroid not enlarged, symmetric, no tenderness/mass/nodules Lungs: clear to auscultation bilaterally Heart: regular rate and rhythm Abdomen: soft, non-tender; bowel sounds normal; no masses,  no organomegaly Extremities: extremities normal, atraumatic, no cyanosis or edema Pulses: 2+ and symmetric Skin: Skin color, texture, turgor normal. No rashes or lesions Neurologic: Grossly normal Psych: Pleasant  EKG: Normal sinus rhythm at 61, 1st degree  AVB, lateral T wave inversions-personally reviewed- personally reviewed  ASSESSMENT: 1. Atypical chest pain-resolved 2. Morbid obesity 3. Hypertension 4. Dyslipidemia  PLAN: 1.   Bridget Oliver has atypical chest pain which is totally resolved.  Some of it sounds musculoskeletal and may have been improved with NSAIDs and could be related to obesity and pendulous breasts.  In addition she may have had some reflux but continues on Protonix.  She had  negative stress testing in 2015.  She denies any exertional symptoms with minimal activity although she is fairly sedentary, but does do some light housework.  She has an exercise bike at home and have encouraged her to spend some more time exercising.  If her symptoms worsen or are associated with exertion, I am happy to see her back for further testing, otherwise I would not recommend any further testing at this time.  She does have an abnormal EKG with T wave inversions, however this was seen in 2015 prior to her stress testing at that time.  I do not appreciate any significant change.  Thanks for the kind referral.  Follow-up as needed.  Pixie Casino, MD, [facility], [place] Director of the [facility]  [facility] Diplomate of the American Board of Clinical Lipidology Attending Cardiologist  Direct Dial: [phone]  Fax: [phone]  Website:  [email or web address] [email or web address] 08/08/2018, 10:11 AM

## 2018-08-08 NOTE — Patient Instructions (Signed)
Your physician recommends that you schedule a follow-up appointment as needed with Dr. Hilty.  

## 2018-08-23 DIAGNOSIS — Z8739 Personal history of other diseases of the musculoskeletal system and connective tissue: Secondary | ICD-10-CM | POA: Diagnosis not present

## 2018-08-23 DIAGNOSIS — D631 Anemia in chronic kidney disease: Secondary | ICD-10-CM | POA: Diagnosis not present

## 2018-08-23 DIAGNOSIS — N183 Chronic kidney disease, stage 3 (moderate): Secondary | ICD-10-CM | POA: Diagnosis not present

## 2018-08-23 DIAGNOSIS — I129 Hypertensive chronic kidney disease with stage 1 through stage 4 chronic kidney disease, or unspecified chronic kidney disease: Secondary | ICD-10-CM | POA: Diagnosis not present

## 2018-08-23 DIAGNOSIS — E1122 Type 2 diabetes mellitus with diabetic chronic kidney disease: Secondary | ICD-10-CM | POA: Diagnosis not present

## 2018-08-23 DIAGNOSIS — E785 Hyperlipidemia, unspecified: Secondary | ICD-10-CM | POA: Diagnosis not present

## 2018-08-23 DIAGNOSIS — N2581 Secondary hyperparathyroidism of renal origin: Secondary | ICD-10-CM | POA: Diagnosis not present

## 2018-08-23 DIAGNOSIS — N189 Chronic kidney disease, unspecified: Secondary | ICD-10-CM | POA: Diagnosis not present

## 2018-09-13 ENCOUNTER — Ambulatory Visit (INDEPENDENT_AMBULATORY_CARE_PROVIDER_SITE_OTHER): Payer: Medicare HMO

## 2018-09-13 DIAGNOSIS — Z23 Encounter for immunization: Secondary | ICD-10-CM | POA: Diagnosis not present

## 2018-09-27 DIAGNOSIS — E1122 Type 2 diabetes mellitus with diabetic chronic kidney disease: Secondary | ICD-10-CM | POA: Diagnosis not present

## 2018-09-27 DIAGNOSIS — D631 Anemia in chronic kidney disease: Secondary | ICD-10-CM | POA: Diagnosis not present

## 2018-09-27 DIAGNOSIS — N2581 Secondary hyperparathyroidism of renal origin: Secondary | ICD-10-CM | POA: Diagnosis not present

## 2018-09-27 DIAGNOSIS — I129 Hypertensive chronic kidney disease with stage 1 through stage 4 chronic kidney disease, or unspecified chronic kidney disease: Secondary | ICD-10-CM | POA: Diagnosis not present

## 2018-09-27 DIAGNOSIS — N183 Chronic kidney disease, stage 3 (moderate): Secondary | ICD-10-CM | POA: Diagnosis not present

## 2018-09-27 DIAGNOSIS — N189 Chronic kidney disease, unspecified: Secondary | ICD-10-CM | POA: Diagnosis not present

## 2018-09-27 DIAGNOSIS — Z8739 Personal history of other diseases of the musculoskeletal system and connective tissue: Secondary | ICD-10-CM | POA: Diagnosis not present

## 2018-09-27 DIAGNOSIS — E785 Hyperlipidemia, unspecified: Secondary | ICD-10-CM | POA: Diagnosis not present

## 2018-09-29 DIAGNOSIS — R69 Illness, unspecified: Secondary | ICD-10-CM | POA: Diagnosis not present

## 2018-10-31 DIAGNOSIS — N183 Chronic kidney disease, stage 3 (moderate): Secondary | ICD-10-CM | POA: Diagnosis not present

## 2018-11-09 DIAGNOSIS — N183 Chronic kidney disease, stage 3 (moderate): Secondary | ICD-10-CM | POA: Diagnosis not present

## 2018-11-09 DIAGNOSIS — N2581 Secondary hyperparathyroidism of renal origin: Secondary | ICD-10-CM | POA: Diagnosis not present

## 2018-11-09 DIAGNOSIS — E1122 Type 2 diabetes mellitus with diabetic chronic kidney disease: Secondary | ICD-10-CM | POA: Diagnosis not present

## 2018-11-09 DIAGNOSIS — Z8739 Personal history of other diseases of the musculoskeletal system and connective tissue: Secondary | ICD-10-CM | POA: Diagnosis not present

## 2018-11-09 DIAGNOSIS — I129 Hypertensive chronic kidney disease with stage 1 through stage 4 chronic kidney disease, or unspecified chronic kidney disease: Secondary | ICD-10-CM | POA: Diagnosis not present

## 2018-11-09 DIAGNOSIS — D631 Anemia in chronic kidney disease: Secondary | ICD-10-CM | POA: Diagnosis not present

## 2018-11-09 DIAGNOSIS — E785 Hyperlipidemia, unspecified: Secondary | ICD-10-CM | POA: Diagnosis not present

## 2018-11-21 DIAGNOSIS — H40023 Open angle with borderline findings, high risk, bilateral: Secondary | ICD-10-CM | POA: Diagnosis not present

## 2018-11-21 DIAGNOSIS — H35033 Hypertensive retinopathy, bilateral: Secondary | ICD-10-CM | POA: Diagnosis not present

## 2018-11-21 DIAGNOSIS — H35343 Macular cyst, hole, or pseudohole, bilateral: Secondary | ICD-10-CM | POA: Diagnosis not present

## 2018-12-07 ENCOUNTER — Other Ambulatory Visit: Payer: Self-pay | Admitting: Internal Medicine

## 2018-12-08 ENCOUNTER — Other Ambulatory Visit: Payer: Self-pay | Admitting: Internal Medicine

## 2018-12-11 ENCOUNTER — Other Ambulatory Visit: Payer: Self-pay | Admitting: Internal Medicine

## 2018-12-15 ENCOUNTER — Telehealth: Payer: Self-pay | Admitting: Internal Medicine

## 2018-12-15 NOTE — Telephone Encounter (Signed)
Copied from [place] [id number]. Topic: Quick Communication - See Telephone Encounter >> Dec 15, 2018  4:04 PM Rutherford Nail, NT wrote: CRM for notification. See Telephone encounter for: 12/15/18. [employer] calling and states that they are needing some information on a medication to complete a prior authorization on KLOR-CON M20 20 MEQ tablet . Pleas advise.  CB#: [phone]

## 2018-12-20 DIAGNOSIS — I129 Hypertensive chronic kidney disease with stage 1 through stage 4 chronic kidney disease, or unspecified chronic kidney disease: Secondary | ICD-10-CM | POA: Diagnosis not present

## 2018-12-20 DIAGNOSIS — E785 Hyperlipidemia, unspecified: Secondary | ICD-10-CM | POA: Diagnosis not present

## 2018-12-20 DIAGNOSIS — D631 Anemia in chronic kidney disease: Secondary | ICD-10-CM | POA: Diagnosis not present

## 2018-12-20 DIAGNOSIS — E1122 Type 2 diabetes mellitus with diabetic chronic kidney disease: Secondary | ICD-10-CM | POA: Diagnosis not present

## 2018-12-20 DIAGNOSIS — Z8739 Personal history of other diseases of the musculoskeletal system and connective tissue: Secondary | ICD-10-CM | POA: Diagnosis not present

## 2018-12-20 DIAGNOSIS — N2581 Secondary hyperparathyroidism of renal origin: Secondary | ICD-10-CM | POA: Diagnosis not present

## 2018-12-20 DIAGNOSIS — N183 Chronic kidney disease, stage 3 (moderate): Secondary | ICD-10-CM | POA: Diagnosis not present

## 2018-12-20 MED ORDER — CARVEDILOL 25 MG PO TABS: 25.0000 mg | ORAL_TABLET | Freq: Two times a day (BID) | ORAL | 3 refills | Status: DC

## 2018-12-20 NOTE — Addendum Note (Signed)
Addended by: Karren Cobble on: 12/20/2018 01:09 PM   Modules accepted: Orders

## 2018-12-28 ENCOUNTER — Ambulatory Visit (INDEPENDENT_AMBULATORY_CARE_PROVIDER_SITE_OTHER): Payer: Medicare HMO | Admitting: Internal Medicine

## 2018-12-28 ENCOUNTER — Encounter: Payer: Self-pay | Admitting: Internal Medicine

## 2018-12-28 ENCOUNTER — Other Ambulatory Visit (INDEPENDENT_AMBULATORY_CARE_PROVIDER_SITE_OTHER): Payer: Medicare HMO

## 2018-12-28 VITALS — BP 134/80 | HR 68 | Temp 98.0°F | Ht 63.0 in | Wt 238.0 lb

## 2018-12-28 DIAGNOSIS — I129 Hypertensive chronic kidney disease with stage 1 through stage 4 chronic kidney disease, or unspecified chronic kidney disease: Secondary | ICD-10-CM

## 2018-12-28 DIAGNOSIS — N183 Chronic kidney disease, stage 3 (moderate): Secondary | ICD-10-CM

## 2018-12-28 DIAGNOSIS — I119 Hypertensive heart disease without heart failure: Secondary | ICD-10-CM | POA: Diagnosis not present

## 2018-12-28 DIAGNOSIS — Z0000 Encounter for general adult medical examination without abnormal findings: Secondary | ICD-10-CM

## 2018-12-28 DIAGNOSIS — E785 Hyperlipidemia, unspecified: Secondary | ICD-10-CM

## 2018-12-28 DIAGNOSIS — E1122 Type 2 diabetes mellitus with diabetic chronic kidney disease: Secondary | ICD-10-CM

## 2018-12-28 DIAGNOSIS — E538 Deficiency of other specified B group vitamins: Secondary | ICD-10-CM | POA: Diagnosis not present

## 2018-12-28 DIAGNOSIS — I10 Essential (primary) hypertension: Secondary | ICD-10-CM

## 2018-12-28 LAB — URINALYSIS, ROUTINE W REFLEX MICROSCOPIC
Bilirubin Urine: NEGATIVE
Hgb urine dipstick: NEGATIVE
KETONES UR: NEGATIVE
Nitrite: NEGATIVE
RBC / HPF: NONE SEEN (ref 0–?)
Specific Gravity, Urine: 1.015 (ref 1.000–1.030)
Total Protein, Urine: NEGATIVE
Urine Glucose: NEGATIVE
Urobilinogen, UA: 0.2 (ref 0.0–1.0)
pH: 5.5 (ref 5.0–8.0)

## 2018-12-28 LAB — BASIC METABOLIC PANEL
BUN: 28 mg/dL — ABNORMAL HIGH (ref 6–23)
CO2: 24 mEq/L (ref 19–32)
Calcium: 9.3 mg/dL (ref 8.4–10.5)
Chloride: 105 mEq/L (ref 96–112)
Creatinine, Ser: 1.63 mg/dL — ABNORMAL HIGH (ref 0.40–1.20)
GFR: 37.05 mL/min — ABNORMAL LOW (ref >60.00)
Glucose, Bld: 99 mg/dL (ref 70–99)
Potassium: 4.4 mEq/L (ref 3.5–5.1)
Sodium: 141 mEq/L (ref 135–145)

## 2018-12-28 LAB — MICROALBUMIN / CREATININE URINE RATIO
Creatinine,U: 126 mg/dL
Microalb Creat Ratio: 1.1 mg/g (ref 0.0–30.0)
Microalb, Ur: 1.4 mg/dL (ref 0.0–1.9)

## 2018-12-28 LAB — LIPID PANEL
Cholesterol: 161 mg/dL (ref 0–200)
HDL: 28.7 mg/dL — ABNORMAL LOW (ref >39.00)
LDL Cholesterol: 107 mg/dL — ABNORMAL HIGH (ref 0–99)
NonHDL: 132.25
Total CHOL/HDL Ratio: 6
Triglycerides: 126 mg/dL (ref 0.0–149.0)
VLDL: 25.2 mg/dL (ref 0.0–40.0)

## 2018-12-28 LAB — HEPATIC FUNCTION PANEL
ALT: 18 U/L (ref 0–35)
AST: 15 U/L (ref 0–37)
Albumin: 3.8 g/dL (ref 3.5–5.2)
Alkaline Phosphatase: 79 U/L (ref 39–117)
Bilirubin, Direct: 0.1 mg/dL (ref 0.0–0.3)
Total Bilirubin: 0.4 mg/dL (ref 0.2–1.2)
Total Protein: 6.5 g/dL (ref 6.0–8.3)

## 2018-12-28 LAB — CBC WITH DIFFERENTIAL/PLATELET
Basophils Absolute: 0.1 10*3/uL (ref 0.0–0.1)
Basophils Relative: 1.1 % (ref 0.0–3.0)
Eosinophils Absolute: 0.2 10*3/uL (ref 0.0–0.7)
Eosinophils Relative: 2.2 % (ref 0.0–5.0)
HCT: 38.7 % (ref 36.0–46.0)
HEMOGLOBIN: 12.7 g/dL (ref 12.0–15.0)
Lymphocytes Relative: 23.1 % (ref 12.0–46.0)
Lymphs Abs: 1.7 10*3/uL (ref 0.7–4.0)
MCHC: 32.9 g/dL (ref 30.0–36.0)
MCV: 83.2 fl (ref 78.0–100.0)
MONO ABS: 0.8 10*3/uL (ref 0.1–1.0)
Monocytes Relative: 11 % (ref 3.0–12.0)
Neutro Abs: 4.6 10*3/uL (ref 1.4–7.7)
Neutrophils Relative %: 62.6 % (ref 43.0–77.0)
Platelets: 237 10*3/uL (ref 150.0–400.0)
RBC: 4.65 Mil/uL (ref 3.87–5.11)
RDW: 14.5 % (ref 11.5–15.5)
WBC: 7.3 10*3/uL (ref 4.0–10.5)

## 2018-12-28 LAB — TSH: TSH: 3.88 u[IU]/mL (ref 0.35–4.50)

## 2018-12-28 LAB — VITAMIN B12: Vitamin B-12: 504 pg/mL (ref 211–911)

## 2018-12-28 LAB — HEMOGLOBIN A1C: Hgb A1c MFr Bld: 6.1 % (ref 4.6–6.5)

## 2018-12-28 NOTE — Patient Instructions (Signed)

## 2018-12-28 NOTE — Assessment & Plan Note (Signed)
R/o OSA --- Arby Barrette questionnaire - borderline. Pt declined a sleep referral  Hydralazine, Furosemide, Labetalol, Losartan F/u w/Dr Justin Mend

## 2018-12-28 NOTE — Assessment & Plan Note (Addendum)
Lipitor cardiac CT scan for calcium scoring offered

## 2018-12-28 NOTE — Assessment & Plan Note (Signed)
F/u w/Dr Justin Mend Lasix, labetalol, losartan R/o OSA --- Arby Barrette questionnaire - borderline 2/20

## 2018-12-28 NOTE — Assessment & Plan Note (Signed)
On B12 

## 2018-12-28 NOTE — Progress Notes (Signed)
Subjective:  Patient ID: Bridget Oliver, female    DOB: 04-Nov-1941  Age: 78 y.o. MRN: [id number]  CC: No chief complaint on file.   HPI DONELDA MAILHOT presents for a well exam F/u HTN, dyslipidemia, gout  Outpatient Medications Prior to Visit  Medication Sig Dispense Refill  . acetaminophen (TYLENOL) 325 MG tablet Take 2 tablets (650 mg total) by mouth every 6 (six) hours as needed for moderate pain. 30 tablet 0  . acetaminophen-codeine (TYLENOL #3) 300-30 MG tablet Take 1 tablet by mouth every 8 (eight) hours as needed for moderate pain or severe pain. 30 tablet 1  . allopurinol (ZYLOPRIM) 100 MG tablet Take 0.5 tablets (50 mg total) by mouth daily. 30 tablet 6  . atorvastatin (LIPITOR) 40 MG tablet Take 1 tablet (40 mg total) by mouth daily. Annual appt due in Febuary must see provider for future refills 30 tablet 0  . Cholecalciferol (VITAMIN D3) 2000 UNITS capsule Take 1 capsule (2,000 Units total) by mouth daily. 100 capsule 3  . Coenzyme Q10 (COQ10) 100 MG CAPS Take by mouth.    . diclofenac sodium (VOLTAREN) 1 % GEL Apply 4 g topically 4 (four) times daily. 1 Tube 3  . Fish Oil-Cholecalciferol (FISH OIL + D3) 1200-1000 MG-UNIT CAPS Take by mouth.    . furosemide (LASIX) 80 MG tablet Take 1 tablet (80 mg total) by mouth daily. Annual appt due in Febuary must see provider for future refills 30 tablet 0  . hydrALAZINE (APRESOLINE) 25 MG tablet TAKE 1 TABLET BY MOUTH THREE TIMES A DAY 270 tablet 3  . KLOR-CON M20 20 MEQ tablet TAKE 1 TABLET BY MOUTH EVERY DAY 90 tablet 3  . labetalol (NORMODYNE) 200 MG tablet Take 200 mg by mouth 2 (two) times daily.    Marland Kitchen losartan (COZAAR) 100 MG tablet TAKE 1 TABLET BY MOUTH EVERYDAY AT BEDTIME    . Multiple Vitamins-Minerals (CENTRUM SILVER PO) Take 1 tablet by mouth daily.     . nitroGLYCERIN (NITROSTAT) 0.4 MG SL tablet Place 0.4 mg under the tongue every 5 (five) minutes as needed for chest pain. Reported on 05/28/2016    . pantoprazole (PROTONIX) 40 MG  tablet Take 1 tablet (40 mg total) by mouth daily. 30 tablet 11  . Polyethyl Glycol-Propyl Glycol (SYSTANE FREE OP) Place 1 drop into both eyes daily as needed.    Marland Kitchen tiZANidine (ZANAFLEX) 4 MG tablet Take 1 tablet (4 mg total) by mouth every 8 (eight) hours as needed for muscle spasms. 30 tablet 0  . vitamin B-12 (CYANOCOBALAMIN) 1000 MCG tablet Take 500 mcg by mouth every other day.     . vitamin C (ASCORBIC ACID) 500 MG tablet Take 500 mg by mouth daily as needed (for cold).     . carvedilol (COREG) 25 MG tablet Take 1 tablet (25 mg total) by mouth 2 (two) times daily. 180 tablet 3  . losartan (COZAAR) 50 MG tablet Take 1 tablet by mouth daily.  6   No facility-administered medications prior to visit.     ROS: Review of Systems  Constitutional: Negative for activity change, appetite change, chills, fatigue and unexpected weight change.  HENT: Negative for congestion, mouth sores and sinus pressure.   Eyes: Negative for visual disturbance.  Respiratory: Negative for cough and chest tightness.   Gastrointestinal: Negative for abdominal pain and nausea.  Genitourinary: Negative for difficulty urinating, frequency and vaginal pain.  Musculoskeletal: Negative for back pain and gait problem.  Skin: Negative  for pallor and rash.  Neurological: Negative for dizziness, tremors, weakness, numbness and headaches.  Psychiatric/Behavioral: Negative for confusion, sleep disturbance and suicidal ideas.    Objective:  BP 134/80 (BP Location: Left Arm, Patient Position: Sitting, Cuff Size: Large)   Pulse 68   Temp 98 F (36.7 C) (Oral)   Ht 5\' 3"  (1.6 m)   Wt 238 lb (108 kg)   SpO2 96%   BMI 42.16 kg/m   BP Readings from Last 3 Encounters:  12/28/18 134/80  08/08/18 (!) 148/60  06/27/18 (!) 144/68    Wt Readings from Last 3 Encounters:  12/28/18 238 lb (108 kg)  08/08/18 238 lb 9.6 oz (108.2 kg)  06/27/18 239 lb (108.4 kg)    Physical Exam Constitutional:      General: She is not  in acute distress.    Appearance: She is well-developed.  HENT:     Head: Normocephalic.     Right Ear: External ear normal.     Left Ear: External ear normal.     Nose: Nose normal.  Eyes:     General:        Right eye: No discharge.        Left eye: No discharge.     Conjunctiva/sclera: Conjunctivae normal.     Pupils: Pupils are equal, round, and reactive to light.  Neck:     Musculoskeletal: Normal range of motion and neck supple.     Thyroid: No thyromegaly.     Vascular: No JVD.     Trachea: No tracheal deviation.  Cardiovascular:     Rate and Rhythm: Normal rate and regular rhythm.     Heart sounds: Normal heart sounds.  Pulmonary:     Effort: No respiratory distress.     Breath sounds: No stridor. No wheezing.  Abdominal:     General: Bowel sounds are normal. There is no distension.     Palpations: Abdomen is soft. There is no mass.     Tenderness: There is no abdominal tenderness. There is no guarding or rebound.  Musculoskeletal:        General: No tenderness.  Lymphadenopathy:     Cervical: No cervical adenopathy.  Skin:    Findings: No erythema or rash.  Neurological:     Cranial Nerves: No cranial nerve deficit.     Motor: No abnormal muscle tone.     Coordination: Coordination normal.     Deep Tendon Reflexes: Reflexes normal.  Psychiatric:        Behavior: Behavior normal.        Thought Content: Thought content normal.        Judgment: Judgment normal.   obese  Lab Results  Component Value Date   WBC 6.6 06/27/2018   HGB 13.4 06/27/2018   HCT 40.7 06/27/2018   PLT 224.0 06/27/2018   GLUCOSE 104 (H) 06/27/2018   CHOL 137 06/27/2018   TRIG 139.0 06/27/2018   HDL 28.60 (L) 06/27/2018   LDLDIRECT 127.9 07/14/2011   LDLCALC 81 06/27/2018   ALT 17 06/27/2018   AST 17 06/27/2018   NA 140 06/27/2018   K 4.2 06/27/2018   CL 105 06/27/2018   CREATININE 1.51 (H) 06/27/2018   BUN 22 06/27/2018   CO2 26 06/27/2018   TSH 3.01 06/27/2018   INR 0.98  06/07/2014   HGBA1C 6.3 06/27/2018   MICROALBUR 1.2 12/27/2017    Dg Chest 2 View  Result Date: 06/27/2018 CLINICAL DATA:  Cough, wheezing, and intermittent sub  sternal chest pain for the past 2 years. History of diabetes, hypertension, chronic renal disease. EXAM: CHEST - 2 VIEW COMPARISON:  Chest x-ray of June 07 2614. FINDINGS: The lungs are well-expanded. The interstitial markings are mildly prominent though stable. There is no alveolar infiltrate or pleural effusion. The cardiac silhouette is enlarged but stable. The pulmonary vascularity is a slightly more prominent overall. There is calcification in the wall of the aortic arch. The bony thorax exhibits no acute abnormality. IMPRESSION: Findings consistent with low-grade CHF with mild pulmonary vascular congestion. No alveolar pneumonia nor pleural effusion. Thoracic aortic atherosclerosis. Electronically Signed   By: David  Martinique M.D.   On: 06/27/2018 09:48    Assessment & Plan:   There are no diagnoses linked to this encounter.   No orders of the defined types were placed in this encounter.    Follow-up: No follow-ups on file.  Walker Kehr, MD

## 2018-12-28 NOTE — Assessment & Plan Note (Signed)
Here for medicare wellness/physical  Diet: heart healthy  Physical activity: sedentary  Depression/mood screen: negative  Hearing: intact to whispered voice  Visual acuity: grossly normal w/glasses, performs q 6 mo eye exam  ADLs: capable  Fall risk: low to none  Home safety: good  Cognitive evaluation: intact to orientation, naming, recall and repetition  EOL planning: adv directives, full code/ I agree  I have personally reviewed and have noted  1. The patient's medical, surgical and social history  2. Their use of alcohol, tobacco or illicit drugs  3. Their current medications and supplements  4. The patient's functional ability including ADL's, fall risks, home safety risks and hearing or visual impairment.  5. Diet and physical activities  6. Evidence for depression or mood disorders 7. The roster of all physicians providing medical care to patient - is listed in the Snapshot section of the chart and reviewed today.    Today patient counseled on age appropriate routine health concerns for screening and prevention, each reviewed and up to date or declined. Immunizations reviewed and up to date or declined. Labs ordered and reviewed. Risk factors for depression reviewed and negative. Hearing function and visual acuity are intact. ADLs screened and addressed as needed. Functional ability and level of safety reviewed and appropriate. Education, counseling and referrals performed based on assessed risks today. Patient provided with a copy of personalized plan for preventive services.   cardiac CT scan for calcium scoring offered Colon OK  Dr Fuller Plan 1/19

## 2018-12-28 NOTE — Assessment & Plan Note (Signed)
  On diet  

## 2018-12-28 NOTE — Assessment & Plan Note (Addendum)
R/o OSA --- Arby Barrette questionnaire - borderline. Pt declined a sleep referral  Hydralazine, Furosemide, Labetalol, Losartan F/u w/Dr Justin Mend

## 2018-12-29 ENCOUNTER — Other Ambulatory Visit: Payer: Self-pay | Admitting: Internal Medicine

## 2018-12-29 MED ORDER — CEFUROXIME AXETIL 250 MG PO TABS: 250.0000 mg | ORAL_TABLET | Freq: Two times a day (BID) | ORAL | 0 refills | Status: AC

## 2018-12-30 NOTE — Telephone Encounter (Signed)
PA denied.

## 2019-01-01 ENCOUNTER — Other Ambulatory Visit: Payer: Self-pay | Admitting: Internal Medicine

## 2019-03-25 DIAGNOSIS — Z809 Family history of malignant neoplasm, unspecified: Secondary | ICD-10-CM | POA: Diagnosis not present

## 2019-03-25 DIAGNOSIS — K219 Gastro-esophageal reflux disease without esophagitis: Secondary | ICD-10-CM | POA: Diagnosis not present

## 2019-03-25 DIAGNOSIS — Z8249 Family history of ischemic heart disease and other diseases of the circulatory system: Secondary | ICD-10-CM | POA: Diagnosis not present

## 2019-03-25 DIAGNOSIS — E785 Hyperlipidemia, unspecified: Secondary | ICD-10-CM | POA: Diagnosis not present

## 2019-03-25 DIAGNOSIS — I10 Essential (primary) hypertension: Secondary | ICD-10-CM | POA: Diagnosis not present

## 2019-03-25 DIAGNOSIS — R609 Edema, unspecified: Secondary | ICD-10-CM | POA: Diagnosis not present

## 2019-03-25 DIAGNOSIS — Z833 Family history of diabetes mellitus: Secondary | ICD-10-CM | POA: Diagnosis not present

## 2019-03-25 DIAGNOSIS — M109 Gout, unspecified: Secondary | ICD-10-CM | POA: Diagnosis not present

## 2019-04-03 DIAGNOSIS — R69 Illness, unspecified: Secondary | ICD-10-CM | POA: Diagnosis not present

## 2019-04-19 DIAGNOSIS — Z1231 Encounter for screening mammogram for malignant neoplasm of breast: Secondary | ICD-10-CM | POA: Diagnosis not present

## 2019-04-19 LAB — HM MAMMOGRAPHY

## 2019-05-02 DIAGNOSIS — H40023 Open angle with borderline findings, high risk, bilateral: Secondary | ICD-10-CM | POA: Diagnosis not present

## 2019-05-02 DIAGNOSIS — H04123 Dry eye syndrome of bilateral lacrimal glands: Secondary | ICD-10-CM | POA: Diagnosis not present

## 2019-05-12 ENCOUNTER — Other Ambulatory Visit: Payer: Self-pay | Admitting: Internal Medicine

## 2019-05-24 ENCOUNTER — Encounter: Payer: Self-pay | Admitting: Internal Medicine

## 2019-06-02 ENCOUNTER — Telehealth: Payer: Self-pay | Admitting: Internal Medicine

## 2019-06-02 NOTE — Telephone Encounter (Signed)
Bridget Oliver with [facility] calling and states that they have tried to reach out to the patient and have not had success. States that the patient is overdue for some medications and would like to know if the assistant could contact the patient and inform her of this information or discuss at next OV? Please advise.   Medications that are over due:  atorvastatin (LIPITOR) 40 MG tablet  Since 04/06/2019 losartan (COZAAR) 100 MG tablet since 05/13/2019

## 2019-06-02 NOTE — Telephone Encounter (Signed)
Will discuss at upcoming appt.

## 2019-06-29 ENCOUNTER — Other Ambulatory Visit (INDEPENDENT_AMBULATORY_CARE_PROVIDER_SITE_OTHER): Payer: Medicare HMO

## 2019-06-29 ENCOUNTER — Encounter: Payer: Self-pay | Admitting: Internal Medicine

## 2019-06-29 ENCOUNTER — Ambulatory Visit (INDEPENDENT_AMBULATORY_CARE_PROVIDER_SITE_OTHER): Payer: Medicare HMO | Admitting: Internal Medicine

## 2019-06-29 ENCOUNTER — Other Ambulatory Visit: Payer: Self-pay

## 2019-06-29 DIAGNOSIS — I119 Hypertensive heart disease without heart failure: Secondary | ICD-10-CM

## 2019-06-29 DIAGNOSIS — E1122 Type 2 diabetes mellitus with diabetic chronic kidney disease: Secondary | ICD-10-CM | POA: Diagnosis not present

## 2019-06-29 DIAGNOSIS — N183 Chronic kidney disease, stage 3 unspecified: Secondary | ICD-10-CM

## 2019-06-29 DIAGNOSIS — E785 Hyperlipidemia, unspecified: Secondary | ICD-10-CM | POA: Diagnosis not present

## 2019-06-29 DIAGNOSIS — E538 Deficiency of other specified B group vitamins: Secondary | ICD-10-CM

## 2019-06-29 LAB — BASIC METABOLIC PANEL
BUN: 25 mg/dL — ABNORMAL HIGH (ref 6–23)
CO2: 26 mEq/L (ref 19–32)
Calcium: 9.7 mg/dL (ref 8.4–10.5)
Chloride: 108 mEq/L (ref 96–112)
Creatinine, Ser: 1.59 mg/dL — ABNORMAL HIGH (ref 0.40–1.20)
GFR: 38.08 mL/min — ABNORMAL LOW (ref >60.00)
Glucose, Bld: 101 mg/dL — ABNORMAL HIGH (ref 70–99)
Potassium: 4.8 mEq/L (ref 3.5–5.1)
Sodium: 140 mEq/L (ref 135–145)

## 2019-06-29 LAB — HEMOGLOBIN A1C: Hgb A1c MFr Bld: 6.2 % (ref 4.6–6.5)

## 2019-06-29 NOTE — Assessment & Plan Note (Signed)
On B12 

## 2019-06-29 NOTE — Progress Notes (Signed)
Subjective:  Patient ID: Bridget Oliver, female    DOB: 12-17-1940  Age: 78 y.o. MRN: [id number]  CC: No chief complaint on file.   HPI Bridget Oliver presents for HTN, CRF, DM f/u SBP 150 at home  Outpatient Medications Prior to Visit  Medication Sig Dispense Refill  . acetaminophen (TYLENOL) 325 MG tablet Take 2 tablets (650 mg total) by mouth every 6 (six) hours as needed for moderate pain. 30 tablet 0  . acetaminophen-codeine (TYLENOL #3) 300-30 MG tablet Take 1 tablet by mouth every 8 (eight) hours as needed for moderate pain or severe pain. 30 tablet 1  . allopurinol (ZYLOPRIM) 100 MG tablet Take 0.5 tablets (50 mg total) by mouth daily. 30 tablet 6  . atorvastatin (LIPITOR) 40 MG tablet Take 1 tablet (40 mg total) by mouth daily. 90 tablet 3  . Cholecalciferol (VITAMIN D3) 2000 UNITS capsule Take 1 capsule (2,000 Units total) by mouth daily. 100 capsule 3  . Coenzyme Q10 (COQ10) 100 MG CAPS Take by mouth.    . diclofenac sodium (VOLTAREN) 1 % GEL Apply 4 g topically 4 (four) times daily. 1 Tube 3  . Fish Oil-Cholecalciferol (FISH OIL + D3) 1200-1000 MG-UNIT CAPS Take by mouth.    . furosemide (LASIX) 80 MG tablet Take 1 tablet (80 mg total) by mouth daily. 90 tablet 3  . hydrALAZINE (APRESOLINE) 25 MG tablet TAKE 1 TABLET BY MOUTH THREE TIMES A DAY 270 tablet 3  . KLOR-CON M20 20 MEQ tablet TAKE 1 TABLET BY MOUTH EVERY DAY 90 tablet 3  . labetalol (NORMODYNE) 200 MG tablet Take 200 mg by mouth 2 (two) times daily.    Marland Kitchen losartan (COZAAR) 100 MG tablet TAKE 1 TABLET BY MOUTH EVERYDAY AT BEDTIME    . Multiple Vitamins-Minerals (CENTRUM SILVER PO) Take 1 tablet by mouth daily.     . nitroGLYCERIN (NITROSTAT) 0.4 MG SL tablet Place 0.4 mg under the tongue every 5 (five) minutes as needed for chest pain. Reported on 05/28/2016    . pantoprazole (PROTONIX) 40 MG tablet TAKE 1 TABLET BY MOUTH EVERY DAY 90 tablet 3  . Polyethyl Glycol-Propyl Glycol (SYSTANE FREE OP) Place 1 drop into both eyes  daily as needed.    Marland Kitchen tiZANidine (ZANAFLEX) 4 MG tablet Take 1 tablet (4 mg total) by mouth every 8 (eight) hours as needed for muscle spasms. 30 tablet 0  . vitamin B-12 (CYANOCOBALAMIN) 1000 MCG tablet Take 500 mcg by mouth every other day.     . vitamin C (ASCORBIC ACID) 500 MG tablet Take 500 mg by mouth daily as needed (for cold).      No facility-administered medications prior to visit.     ROS: Review of Systems  Constitutional: Negative for activity change, appetite change, chills, fatigue and unexpected weight change.  HENT: Negative for congestion, mouth sores and sinus pressure.   Eyes: Negative for visual disturbance.  Respiratory: Negative for cough and chest tightness.   Gastrointestinal: Negative for abdominal pain and nausea.  Genitourinary: Negative for difficulty urinating, frequency and vaginal pain.  Musculoskeletal: Negative for back pain and gait problem.  Skin: Negative for pallor and rash.  Neurological: Negative for dizziness, tremors, weakness, numbness and headaches.  Psychiatric/Behavioral: Negative for confusion, sleep disturbance and suicidal ideas.    Objective:  BP (!) 170/80 (BP Location: Left Arm, Patient Position: Sitting, Cuff Size: Large)   Pulse 75   Temp (!) 97.4 F (36.3 C) (Temporal)   Ht 5\' 3"  (  1.6 m)   Wt 238 lb (108 kg)   SpO2 97%   BMI 42.16 kg/m   BP Readings from Last 3 Encounters:  06/29/19 (!) 170/80  12/28/18 134/80  08/08/18 (!) 148/60    Wt Readings from Last 3 Encounters:  06/29/19 238 lb (108 kg)  12/28/18 238 lb (108 kg)  08/08/18 238 lb 9.6 oz (108.2 kg)    Physical Exam Constitutional:      General: She is not in acute distress.    Appearance: She is well-developed. She is obese.  HENT:     Head: Normocephalic.     Right Ear: External ear normal.     Left Ear: External ear normal.     Nose: Nose normal.  Eyes:     General:        Right eye: No discharge.        Left eye: No discharge.      Conjunctiva/sclera: Conjunctivae normal.     Pupils: Pupils are equal, round, and reactive to light.  Neck:     Musculoskeletal: Normal range of motion and neck supple.     Thyroid: No thyromegaly.     Vascular: No JVD.     Trachea: No tracheal deviation.  Cardiovascular:     Rate and Rhythm: Normal rate and regular rhythm.     Heart sounds: Normal heart sounds.  Pulmonary:     Effort: No respiratory distress.     Breath sounds: No stridor. No wheezing.  Abdominal:     General: Bowel sounds are normal. There is no distension.     Palpations: Abdomen is soft. There is no mass.     Tenderness: There is no abdominal tenderness. There is no guarding or rebound.  Musculoskeletal:        General: No tenderness.  Lymphadenopathy:     Cervical: No cervical adenopathy.  Skin:    Findings: No erythema or rash.  Neurological:     Mental Status: She is oriented to person, place, and time.     Cranial Nerves: No cranial nerve deficit.     Motor: No abnormal muscle tone.     Coordination: Coordination normal.     Deep Tendon Reflexes: Reflexes normal.  Psychiatric:        Behavior: Behavior normal.        Thought Content: Thought content normal.        Judgment: Judgment normal.   obese  Lab Results  Component Value Date   WBC 7.3 12/28/2018   HGB 12.7 12/28/2018   HCT 38.7 12/28/2018   PLT 237.0 12/28/2018   GLUCOSE 99 12/28/2018   CHOL 161 12/28/2018   TRIG 126.0 12/28/2018   HDL 28.70 (L) 12/28/2018   LDLDIRECT 127.9 07/14/2011   LDLCALC 107 (H) 12/28/2018   ALT 18 12/28/2018   AST 15 12/28/2018   NA 141 12/28/2018   K 4.4 12/28/2018   CL 105 12/28/2018   CREATININE 1.63 (H) 12/28/2018   BUN 28 (H) 12/28/2018   CO2 24 12/28/2018   TSH 3.88 12/28/2018   INR 0.98 06/07/2014   HGBA1C 6.1 12/28/2018   MICROALBUR 1.4 12/28/2018    Dg Chest 2 View  Result Date: 06/27/2018 CLINICAL DATA:  Cough, wheezing, and intermittent sub sternal chest pain for the past 2 years.  History of diabetes, hypertension, chronic renal disease. EXAM: CHEST - 2 VIEW COMPARISON:  Chest x-ray of June 07 2614. FINDINGS: The lungs are well-expanded. The interstitial markings are mildly prominent though  stable. There is no alveolar infiltrate or pleural effusion. The cardiac silhouette is enlarged but stable. The pulmonary vascularity is a slightly more prominent overall. There is calcification in the wall of the aortic arch. The bony thorax exhibits no acute abnormality. IMPRESSION: Findings consistent with low-grade CHF with mild pulmonary vascular congestion. No alveolar pneumonia nor pleural effusion. Thoracic aortic atherosclerosis. Electronically Signed   By: David  Martinique M.D.   On: 06/27/2018 09:48    Assessment & Plan:   Diagnoses and all orders for this visit:  Type 2 diabetes mellitus with stage 3 chronic kidney disease, without long-term current use of insulin (HCC) -     Basic metabolic panel; Future -     Hemoglobin A1c; Standing  Hypertensive cardiomegaly  B12 deficiency  Dyslipidemia  OBESITY, MORBID     No orders of the defined types were placed in this encounter.    Follow-up: Return in about 4 months (around 10/29/2019) for a follow-up visit.  Walker Kehr, MD

## 2019-06-29 NOTE — Assessment & Plan Note (Signed)
Labs

## 2019-06-29 NOTE — Assessment & Plan Note (Signed)
Dietary rec made

## 2019-06-29 NOTE — Assessment & Plan Note (Signed)
Hydralazine, Furosemide, Labetalol, Losartan

## 2019-06-29 NOTE — Assessment & Plan Note (Signed)
Lipitor 

## 2019-06-29 NOTE — Patient Instructions (Signed)
If you have medicare related insurance (such as traditional Medicare, Blue Cross Medicare, United HealthCare Medicare, or similar), Please make an appointment at the scheduling desk with Jill, the Wellness Health Coach, for your Wellness visit in this office, which is a benefit with your insurance.    These suggestions will probably help you to improve your metabolism if you are not overweight and to lose weight if you are overweight: 1.  Reduce your consumption of sugars and starches.  Eliminate high fructose corn syrup from your diet.  Reduce your consumption of processed foods.  For desserts try to have seasonal fruits, berries, nuts, cheeses or dark chocolate with more than 70% cacao. 2.  Do not snack 3.  You do not have to eat breakfast.  If you choose to have breakfast-eat plain greek yogurt, eggs, oatmeal (without sugar) 4.  Drink water, freshly brewed unsweetened tea (green, black or herbal) or coffee.  Do not drink sodas including diet sodas , juices, beverages sweetened with artificial sweeteners. 5.  Reduce your consumption of refined grains. 6.  Avoid protein drinks such as Optifast, Slim fast etc. Eat chicken, fish, meat, dairy and beans for your sources of protein 7.  Natural unprocessed fats like cold pressed virgin olive oil, butter, coconut oil are good for you.  Eat avocados 8.  Increase your consumption of fiber.  Fruits, berries, vegetables, whole grains, flaxseeds, Chia seeds, beans, popcorn, nuts, oatmeal are good sources of fiber 9.  Use vinegar in your diet, i.e. apple cider vinegar, red wine or balsamic vinegar 10.  You can try fasting.  For example you can skip breakfast and lunch every other day (24-hour fast) 11.  Stress reduction, good night sleep, relaxation, meditation, yoga and other physical activity is likely to help you to maintain low weight too. 12.  If you drink alcohol, limit your alcohol intake to no more than 2 drinks a day.   Mediterranean diet is good for  you. ([employer] has a typical Mediterranean cuisine menu) The Mediterranean diet is a way of eating based on the traditional cuisine of countries bordering the [place] Sea. While there is no single definition of the Mediterranean diet, it is typically high in vegetables, fruits, whole grains, beans, nut and seeds, and olive oil. The main components of Mediterranean diet include: . Daily consumption of vegetables, fruits, whole grains and healthy fats  . Weekly intake of fish, poultry, beans and eggs  . Moderate portions of dairy products  . Limited intake of red meat Other important elements of the Mediterranean diet are sharing meals with family and friends, enjoying a glass of red wine and being physically active. Health benefits of a Mediterranean diet: A traditional Mediterranean diet consisting of large quantities of fresh fruits and vegetables, nuts, fish and olive oil-coupled with physical activity-can reduce your risk of serious mental and physical health problems by: Preventing heart disease and strokes. Following a Mediterranean diet limits your intake of refined breads, processed foods, and red meat, and encourages drinking red wine instead of hard liquor-all factors that can help prevent heart disease and stroke. Keeping you agile. If you're an older adult, the nutrients gained with a Mediterranean diet may reduce your risk of developing muscle weakness and other signs of frailty by about 70 percent. Reducing the risk of Alzheimer's. Research suggests that the Mediterranean diet may improve cholesterol, blood sugar levels, and overall blood vessel health, which in turn may reduce your risk of Alzheimer's disease or dementia. Halving the   risk of Parkinson's disease. The high levels of antioxidants in the Mediterranean diet can prevent cells from undergoing a damaging process called oxidative stress, thereby cutting the risk of Parkinson's disease in half. Increasing longevity. By  reducing your risk of developing heart disease or cancer with the Mediterranean diet, you're reducing your risk of death at any age by 20%. Protecting against type 2 diabetes. A Mediterranean diet is rich in fiber which digests slowly, prevents huge swings in blood sugar, and can help you maintain a healthy weight.    Cabbage soup recipe that will not make you gain weight: Take 1 small head of cabbage, 1 average pack of celery, 4 green peppers, 4 onions, 2 cans diced tomatoes (they are not available without salt), salt and spices to taste.  Chop cabbage, celery, peppers and onions.  And tomatoes and 2-2.5 liters (2.5 quarts) of water so that it would just cover the vegetables.  Bring to boil.  Add spices and salt.  Turn heat to low/medium and simmer for 20-25 minutes.  Naturally, you can make a smaller batch and change some of the ingredients.  

## 2019-08-02 ENCOUNTER — Other Ambulatory Visit: Payer: Self-pay | Admitting: Internal Medicine

## 2019-08-11 ENCOUNTER — Other Ambulatory Visit: Payer: Self-pay

## 2019-08-11 ENCOUNTER — Ambulatory Visit (INDEPENDENT_AMBULATORY_CARE_PROVIDER_SITE_OTHER): Payer: Medicare HMO

## 2019-08-11 DIAGNOSIS — Z23 Encounter for immunization: Secondary | ICD-10-CM | POA: Diagnosis not present

## 2019-09-13 DIAGNOSIS — N2581 Secondary hyperparathyroidism of renal origin: Secondary | ICD-10-CM | POA: Diagnosis not present

## 2019-09-13 DIAGNOSIS — E785 Hyperlipidemia, unspecified: Secondary | ICD-10-CM | POA: Diagnosis not present

## 2019-09-13 DIAGNOSIS — E1122 Type 2 diabetes mellitus with diabetic chronic kidney disease: Secondary | ICD-10-CM | POA: Diagnosis not present

## 2019-09-13 DIAGNOSIS — I129 Hypertensive chronic kidney disease with stage 1 through stage 4 chronic kidney disease, or unspecified chronic kidney disease: Secondary | ICD-10-CM | POA: Diagnosis not present

## 2019-09-13 DIAGNOSIS — Z8739 Personal history of other diseases of the musculoskeletal system and connective tissue: Secondary | ICD-10-CM | POA: Diagnosis not present

## 2019-09-13 DIAGNOSIS — N183 Chronic kidney disease, stage 3 unspecified: Secondary | ICD-10-CM | POA: Diagnosis not present

## 2019-09-28 DIAGNOSIS — I129 Hypertensive chronic kidney disease with stage 1 through stage 4 chronic kidney disease, or unspecified chronic kidney disease: Secondary | ICD-10-CM | POA: Diagnosis not present

## 2019-10-30 ENCOUNTER — Encounter: Payer: Self-pay | Admitting: Internal Medicine

## 2019-10-30 ENCOUNTER — Ambulatory Visit (INDEPENDENT_AMBULATORY_CARE_PROVIDER_SITE_OTHER): Payer: Medicare HMO | Admitting: Internal Medicine

## 2019-10-30 ENCOUNTER — Other Ambulatory Visit: Payer: Self-pay

## 2019-10-30 DIAGNOSIS — M1000 Idiopathic gout, unspecified site: Secondary | ICD-10-CM | POA: Diagnosis not present

## 2019-10-30 DIAGNOSIS — E538 Deficiency of other specified B group vitamins: Secondary | ICD-10-CM

## 2019-10-30 DIAGNOSIS — N1831 Chronic kidney disease, stage 3a: Secondary | ICD-10-CM

## 2019-10-30 DIAGNOSIS — K219 Gastro-esophageal reflux disease without esophagitis: Secondary | ICD-10-CM

## 2019-10-30 DIAGNOSIS — E1121 Type 2 diabetes mellitus with diabetic nephropathy: Secondary | ICD-10-CM

## 2019-10-30 DIAGNOSIS — E1122 Type 2 diabetes mellitus with diabetic chronic kidney disease: Secondary | ICD-10-CM

## 2019-10-30 NOTE — Assessment & Plan Note (Signed)
Protonix.  ?

## 2019-10-30 NOTE — Patient Instructions (Signed)
If you have medicare related insurance (such as traditional Medicare, Blue Cross Medicare, United HealthCare Medicare, or similar), Please make an appointment at the scheduling desk with Jill, the Wellness Health Coach, for your Wellness visit in this office, which is a benefit with your insurance.  

## 2019-10-30 NOTE — Assessment & Plan Note (Signed)
On B12 

## 2019-10-30 NOTE — Assessment & Plan Note (Signed)
  On diet  

## 2019-10-30 NOTE — Assessment & Plan Note (Signed)
No relapse 

## 2019-10-30 NOTE — Progress Notes (Signed)
Subjective:  Patient ID: Bridget Oliver, female    DOB: 1941/01/05  Age: 78 y.o. MRN: [id number]  CC: No chief complaint on file.   HPI MIRJANA TARLETON presents for HTN, gout, CRI f/u Labs at Dr Jason Nest  Outpatient Medications Prior to Visit  Medication Sig Dispense Refill  . acetaminophen (TYLENOL) 325 MG tablet Take 2 tablets (650 mg total) by mouth every 6 (six) hours as needed for moderate pain. 30 tablet 0  . acetaminophen-codeine (TYLENOL #3) 300-30 MG tablet Take 1 tablet by mouth every 8 (eight) hours as needed for moderate pain or severe pain. 30 tablet 1  . allopurinol (ZYLOPRIM) 100 MG tablet TAKE 1/2 TABLET BY MOUTH DAILY 30 tablet 6  . atorvastatin (LIPITOR) 40 MG tablet Take 1 tablet (40 mg total) by mouth daily. 90 tablet 3  . Cholecalciferol (VITAMIN D3) 2000 UNITS capsule Take 1 capsule (2,000 Units total) by mouth daily. 100 capsule 3  . Coenzyme Q10 (COQ10) 100 MG CAPS Take by mouth.    . diclofenac sodium (VOLTAREN) 1 % GEL Apply 4 g topically 4 (four) times daily. 1 Tube 3  . Fish Oil-Cholecalciferol (FISH OIL + D3) 1200-1000 MG-UNIT CAPS Take by mouth.    . furosemide (LASIX) 80 MG tablet Take 1 tablet (80 mg total) by mouth daily. 90 tablet 3  . hydrALAZINE (APRESOLINE) 50 MG tablet Take 50 mg by mouth 3 (three) times daily.    Marland Kitchen KLOR-CON M20 20 MEQ tablet TAKE 1 TABLET BY MOUTH EVERY DAY 90 tablet 3  . labetalol (NORMODYNE) 200 MG tablet Take 200 mg by mouth 2 (two) times daily.    Marland Kitchen losartan (COZAAR) 100 MG tablet TAKE 1 TABLET BY MOUTH EVERYDAY AT BEDTIME    . Multiple Vitamins-Minerals (CENTRUM SILVER PO) Take 1 tablet by mouth daily.     . nitroGLYCERIN (NITROSTAT) 0.4 MG SL tablet Place 0.4 mg under the tongue every 5 (five) minutes as needed for chest pain. Reported on 05/28/2016    . pantoprazole (PROTONIX) 40 MG tablet TAKE 1 TABLET BY MOUTH EVERY DAY 90 tablet 3  . Polyethyl Glycol-Propyl Glycol (SYSTANE FREE OP) Place 1 drop into both eyes daily as needed.    Marland Kitchen  tiZANidine (ZANAFLEX) 4 MG tablet Take 1 tablet (4 mg total) by mouth every 8 (eight) hours as needed for muscle spasms. 30 tablet 0  . vitamin B-12 (CYANOCOBALAMIN) 1000 MCG tablet Take 500 mcg by mouth every other day.     . vitamin C (ASCORBIC ACID) 500 MG tablet Take 500 mg by mouth daily as needed (for cold).     . hydrALAZINE (APRESOLINE) 25 MG tablet TAKE 1 TABLET BY MOUTH THREE TIMES A DAY 270 tablet 3   No facility-administered medications prior to visit.     ROS: Review of Systems  Constitutional: Negative for activity change, appetite change, chills, fatigue and unexpected weight change.  HENT: Negative for congestion, mouth sores and sinus pressure.   Eyes: Negative for visual disturbance.  Respiratory: Negative for cough and chest tightness.   Gastrointestinal: Negative for abdominal pain and nausea.  Genitourinary: Negative for difficulty urinating, frequency and vaginal pain.  Musculoskeletal: Positive for arthralgias, back pain and gait problem.  Skin: Negative for pallor and rash.  Neurological: Negative for dizziness, tremors, weakness, numbness and headaches.  Psychiatric/Behavioral: Negative for confusion, sleep disturbance and suicidal ideas.    Objective:  BP (!) 142/68 (BP Location: Left Arm, Patient Position: Sitting, Cuff Size: Large)  Pulse 75   Temp 98.1 F (36.7 C) (Oral)   Ht 5\' 3"  (1.6 m)   Wt 237 lb (107.5 kg)   SpO2 91%   BMI 41.98 kg/m   BP Readings from Last 3 Encounters:  10/30/19 (!) 142/68  06/29/19 140/80  12/28/18 134/80    Wt Readings from Last 3 Encounters:  10/30/19 237 lb (107.5 kg)  06/29/19 238 lb (108 kg)  12/28/18 238 lb (108 kg)    Physical Exam Constitutional:      General: She is not in acute distress.    Appearance: She is well-developed. She is obese.  HENT:     Head: Normocephalic.     Right Ear: External ear normal.     Left Ear: External ear normal.     Nose: Nose normal.  Eyes:     General:        Right  eye: No discharge.        Left eye: No discharge.     Conjunctiva/sclera: Conjunctivae normal.     Pupils: Pupils are equal, round, and reactive to light.  Neck:     Musculoskeletal: Normal range of motion and neck supple.     Thyroid: No thyromegaly.     Vascular: No JVD.     Trachea: No tracheal deviation.  Cardiovascular:     Rate and Rhythm: Normal rate and regular rhythm.     Heart sounds: Normal heart sounds.  Pulmonary:     Effort: No respiratory distress.     Breath sounds: No stridor. No wheezing.  Abdominal:     General: Bowel sounds are normal. There is no distension.     Palpations: Abdomen is soft. There is no mass.     Tenderness: There is no abdominal tenderness. There is no guarding or rebound.  Musculoskeletal:        General: No tenderness.  Lymphadenopathy:     Cervical: No cervical adenopathy.  Skin:    Findings: No erythema or rash.  Neurological:     Cranial Nerves: No cranial nerve deficit.     Motor: No abnormal muscle tone.     Coordination: Coordination normal.     Deep Tendon Reflexes: Reflexes normal.  Psychiatric:        Behavior: Behavior normal.        Thought Content: Thought content normal.        Judgment: Judgment normal.     Lab Results  Component Value Date   WBC 7.3 12/28/2018   HGB 12.7 12/28/2018   HCT 38.7 12/28/2018   PLT 237.0 12/28/2018   GLUCOSE 101 (H) 06/29/2019   CHOL 161 12/28/2018   TRIG 126.0 12/28/2018   HDL 28.70 (L) 12/28/2018   LDLDIRECT 127.9 07/14/2011   LDLCALC 107 (H) 12/28/2018   ALT 18 12/28/2018   AST 15 12/28/2018   NA 140 06/29/2019   K 4.8 06/29/2019   CL 108 06/29/2019   CREATININE 1.59 (H) 06/29/2019   BUN 25 (H) 06/29/2019   CO2 26 06/29/2019   TSH 3.88 12/28/2018   INR 0.98 06/07/2014   HGBA1C 6.2 06/29/2019   MICROALBUR 1.4 12/28/2018    Dg Chest 2 View  Result Date: 06/27/2018 CLINICAL DATA:  Cough, wheezing, and intermittent sub sternal chest pain for the past 2 years. History of  diabetes, hypertension, chronic renal disease. EXAM: CHEST - 2 VIEW COMPARISON:  Chest x-ray of June 07 2614. FINDINGS: The lungs are well-expanded. The interstitial markings are mildly prominent though stable.  There is no alveolar infiltrate or pleural effusion. The cardiac silhouette is enlarged but stable. The pulmonary vascularity is a slightly more prominent overall. There is calcification in the wall of the aortic arch. The bony thorax exhibits no acute abnormality. IMPRESSION: Findings consistent with low-grade CHF with mild pulmonary vascular congestion. No alveolar pneumonia nor pleural effusion. Thoracic aortic atherosclerosis. Electronically Signed   By: David  Martinique M.D.   On: 06/27/2018 09:48    Assessment & Plan:   There are no diagnoses linked to this encounter.   No orders of the defined types were placed in this encounter.    Follow-up: No follow-ups on file.  Walker Kehr, MD

## 2019-10-30 NOTE — Assessment & Plan Note (Signed)
Wt Readings from Last 3 Encounters:  10/30/19 237 lb (107.5 kg)  06/29/19 238 lb (108 kg)  12/28/18 238 lb (108 kg)

## 2019-11-27 ENCOUNTER — Other Ambulatory Visit: Payer: Self-pay | Admitting: Internal Medicine

## 2019-11-27 DIAGNOSIS — R69 Illness, unspecified: Secondary | ICD-10-CM | POA: Diagnosis not present

## 2019-11-28 DIAGNOSIS — R7309 Other abnormal glucose: Secondary | ICD-10-CM | POA: Diagnosis not present

## 2019-11-28 DIAGNOSIS — H35343 Macular cyst, hole, or pseudohole, bilateral: Secondary | ICD-10-CM | POA: Diagnosis not present

## 2019-11-28 DIAGNOSIS — H40023 Open angle with borderline findings, high risk, bilateral: Secondary | ICD-10-CM | POA: Diagnosis not present

## 2019-11-28 DIAGNOSIS — H04123 Dry eye syndrome of bilateral lacrimal glands: Secondary | ICD-10-CM | POA: Diagnosis not present

## 2019-12-15 ENCOUNTER — Other Ambulatory Visit: Payer: Self-pay | Admitting: Internal Medicine

## 2020-01-19 DIAGNOSIS — Z79891 Long term (current) use of opiate analgesic: Secondary | ICD-10-CM | POA: Diagnosis not present

## 2020-01-19 DIAGNOSIS — K219 Gastro-esophageal reflux disease without esophagitis: Secondary | ICD-10-CM | POA: Diagnosis not present

## 2020-01-19 DIAGNOSIS — M109 Gout, unspecified: Secondary | ICD-10-CM | POA: Diagnosis not present

## 2020-01-19 DIAGNOSIS — I10 Essential (primary) hypertension: Secondary | ICD-10-CM | POA: Diagnosis not present

## 2020-01-19 DIAGNOSIS — R609 Edema, unspecified: Secondary | ICD-10-CM | POA: Diagnosis not present

## 2020-01-19 DIAGNOSIS — E785 Hyperlipidemia, unspecified: Secondary | ICD-10-CM | POA: Diagnosis not present

## 2020-01-19 DIAGNOSIS — G8929 Other chronic pain: Secondary | ICD-10-CM | POA: Diagnosis not present

## 2020-01-19 DIAGNOSIS — M1990 Unspecified osteoarthritis, unspecified site: Secondary | ICD-10-CM | POA: Diagnosis not present

## 2020-01-19 DIAGNOSIS — Z8249 Family history of ischemic heart disease and other diseases of the circulatory system: Secondary | ICD-10-CM | POA: Diagnosis not present

## 2020-01-19 DIAGNOSIS — Z008 Encounter for other general examination: Secondary | ICD-10-CM | POA: Diagnosis not present

## 2020-03-25 DIAGNOSIS — E785 Hyperlipidemia, unspecified: Secondary | ICD-10-CM | POA: Diagnosis not present

## 2020-03-25 DIAGNOSIS — N2581 Secondary hyperparathyroidism of renal origin: Secondary | ICD-10-CM | POA: Diagnosis not present

## 2020-03-25 DIAGNOSIS — I129 Hypertensive chronic kidney disease with stage 1 through stage 4 chronic kidney disease, or unspecified chronic kidney disease: Secondary | ICD-10-CM | POA: Diagnosis not present

## 2020-03-25 DIAGNOSIS — Z8739 Personal history of other diseases of the musculoskeletal system and connective tissue: Secondary | ICD-10-CM | POA: Diagnosis not present

## 2020-03-25 DIAGNOSIS — E1122 Type 2 diabetes mellitus with diabetic chronic kidney disease: Secondary | ICD-10-CM | POA: Diagnosis not present

## 2020-03-25 DIAGNOSIS — N183 Chronic kidney disease, stage 3 unspecified: Secondary | ICD-10-CM | POA: Diagnosis not present

## 2020-04-21 ENCOUNTER — Other Ambulatory Visit: Payer: Self-pay | Admitting: Internal Medicine

## 2020-04-23 DIAGNOSIS — E1122 Type 2 diabetes mellitus with diabetic chronic kidney disease: Secondary | ICD-10-CM | POA: Diagnosis not present

## 2020-04-23 DIAGNOSIS — Z8739 Personal history of other diseases of the musculoskeletal system and connective tissue: Secondary | ICD-10-CM | POA: Diagnosis not present

## 2020-04-23 DIAGNOSIS — E785 Hyperlipidemia, unspecified: Secondary | ICD-10-CM | POA: Diagnosis not present

## 2020-04-23 DIAGNOSIS — I129 Hypertensive chronic kidney disease with stage 1 through stage 4 chronic kidney disease, or unspecified chronic kidney disease: Secondary | ICD-10-CM | POA: Diagnosis not present

## 2020-04-23 DIAGNOSIS — N183 Chronic kidney disease, stage 3 unspecified: Secondary | ICD-10-CM | POA: Diagnosis not present

## 2020-04-23 DIAGNOSIS — N2581 Secondary hyperparathyroidism of renal origin: Secondary | ICD-10-CM | POA: Diagnosis not present

## 2020-04-24 DIAGNOSIS — Z1231 Encounter for screening mammogram for malignant neoplasm of breast: Secondary | ICD-10-CM | POA: Diagnosis not present

## 2020-04-24 LAB — HM MAMMOGRAPHY

## 2020-04-29 ENCOUNTER — Other Ambulatory Visit: Payer: Self-pay

## 2020-04-29 ENCOUNTER — Ambulatory Visit (INDEPENDENT_AMBULATORY_CARE_PROVIDER_SITE_OTHER): Payer: Medicare HMO | Admitting: Internal Medicine

## 2020-04-29 ENCOUNTER — Encounter: Payer: Self-pay | Admitting: Internal Medicine

## 2020-04-29 VITALS — BP 140/52 | HR 68 | Temp 98.0°F | Ht 63.0 in | Wt 235.0 lb

## 2020-04-29 DIAGNOSIS — I6523 Occlusion and stenosis of bilateral carotid arteries: Secondary | ICD-10-CM

## 2020-04-29 DIAGNOSIS — I129 Hypertensive chronic kidney disease with stage 1 through stage 4 chronic kidney disease, or unspecified chronic kidney disease: Secondary | ICD-10-CM | POA: Diagnosis not present

## 2020-04-29 DIAGNOSIS — E538 Deficiency of other specified B group vitamins: Secondary | ICD-10-CM

## 2020-04-29 DIAGNOSIS — N1831 Chronic kidney disease, stage 3a: Secondary | ICD-10-CM

## 2020-04-29 DIAGNOSIS — E1122 Type 2 diabetes mellitus with diabetic chronic kidney disease: Secondary | ICD-10-CM

## 2020-04-29 DIAGNOSIS — E1121 Type 2 diabetes mellitus with diabetic nephropathy: Secondary | ICD-10-CM | POA: Diagnosis not present

## 2020-04-29 LAB — CBC WITH DIFFERENTIAL/PLATELET
Basophils Absolute: 0.1 10*3/uL (ref 0.0–0.1)
Basophils Relative: 0.8 % (ref 0.0–3.0)
Eosinophils Absolute: 0.2 10*3/uL (ref 0.0–0.7)
Eosinophils Relative: 1.8 % (ref 0.0–5.0)
HCT: 36.2 % (ref 36.0–46.0)
Hemoglobin: 12 g/dL (ref 12.0–15.0)
Lymphocytes Relative: 24.5 % (ref 12.0–46.0)
Lymphs Abs: 2 10*3/uL (ref 0.7–4.0)
MCHC: 33.2 g/dL (ref 30.0–36.0)
MCV: 83.5 fl (ref 78.0–100.0)
Monocytes Absolute: 0.9 10*3/uL (ref 0.1–1.0)
Monocytes Relative: 10.7 % (ref 3.0–12.0)
Neutro Abs: 5.1 10*3/uL (ref 1.4–7.7)
Neutrophils Relative %: 62.2 % (ref 43.0–77.0)
Platelets: 221 10*3/uL (ref 150.0–400.0)
RBC: 4.34 Mil/uL (ref 3.87–5.11)
RDW: 14.8 % (ref 11.5–15.5)
WBC: 8.2 10*3/uL (ref 4.0–10.5)

## 2020-04-29 LAB — BASIC METABOLIC PANEL
BUN: 29 mg/dL — ABNORMAL HIGH (ref 6–23)
CO2: 24 mEq/L (ref 19–32)
Calcium: 9.1 mg/dL (ref 8.4–10.5)
Chloride: 106 mEq/L (ref 96–112)
Creatinine, Ser: 1.69 mg/dL — ABNORMAL HIGH (ref 0.40–1.20)
GFR: 35.41 mL/min — ABNORMAL LOW (ref >60.00)
Glucose, Bld: 110 mg/dL — ABNORMAL HIGH (ref 70–99)
Potassium: 4.3 mEq/L (ref 3.5–5.1)
Sodium: 138 mEq/L (ref 135–145)

## 2020-04-29 LAB — HEPATIC FUNCTION PANEL
ALT: 16 U/L (ref 0–35)
AST: 16 U/L (ref 0–37)
Albumin: 3.6 g/dL (ref 3.5–5.2)
Alkaline Phosphatase: 73 U/L (ref 39–117)
Bilirubin, Direct: 0.1 mg/dL (ref 0.0–0.3)
Total Bilirubin: 0.4 mg/dL (ref 0.2–1.2)
Total Protein: 6.4 g/dL (ref 6.0–8.3)

## 2020-04-29 LAB — URINALYSIS, ROUTINE W REFLEX MICROSCOPIC
Bilirubin Urine: NEGATIVE
Hgb urine dipstick: NEGATIVE
Ketones, ur: NEGATIVE
Nitrite: NEGATIVE
Specific Gravity, Urine: 1.015 (ref 1.000–1.030)
Total Protein, Urine: NEGATIVE
Urine Glucose: NEGATIVE
Urobilinogen, UA: 0.2 (ref 0.0–1.0)
pH: 6 (ref 5.0–8.0)

## 2020-04-29 LAB — LIPID PANEL
Cholesterol: 140 mg/dL (ref 0–200)
HDL: 27.7 mg/dL — ABNORMAL LOW (ref >39.00)
LDL Cholesterol: 91 mg/dL (ref 0–99)
NonHDL: 112.65
Total CHOL/HDL Ratio: 5
Triglycerides: 110 mg/dL (ref 0.0–149.0)
VLDL: 22 mg/dL (ref 0.0–40.0)

## 2020-04-29 LAB — MICROALBUMIN / CREATININE URINE RATIO
Creatinine,U: 115.1 mg/dL
Microalb Creat Ratio: 1.6 mg/g (ref 0.0–30.0)
Microalb, Ur: 1.9 mg/dL (ref 0.0–1.9)

## 2020-04-29 LAB — TSH: TSH: 3.03 u[IU]/mL (ref 0.35–4.50)

## 2020-04-29 LAB — HEMOGLOBIN A1C: Hgb A1c MFr Bld: 6.2 % (ref 4.6–6.5)

## 2020-04-29 NOTE — Progress Notes (Signed)
Subjective:  Patient ID: Bridget Oliver, female    DOB: 1941/09/06  Age: 79 y.o. MRN: [id number]  CC: No chief complaint on file.   HPI Bridget Oliver presents for HTN, dyslipidemia, CRF f/u  Outpatient Medications Prior to Visit  Medication Sig Dispense Refill  . acetaminophen (TYLENOL) 325 MG tablet Take 2 tablets (650 mg total) by mouth every 6 (six) hours as needed for moderate pain. 30 tablet 0  . acetaminophen-codeine (TYLENOL #3) 300-30 MG tablet Take 1 tablet by mouth every 8 (eight) hours as needed for moderate pain or severe pain. 30 tablet 1  . allopurinol (ZYLOPRIM) 100 MG tablet TAKE 1/2 TABLET BY MOUTH DAILY 30 tablet 6  . atorvastatin (LIPITOR) 40 MG tablet TAKE 1 TABLET BY MOUTH EVERY DAY 90 tablet 3  . Cholecalciferol (VITAMIN D3) 2000 UNITS capsule Take 1 capsule (2,000 Units total) by mouth daily. 100 capsule 3  . Coenzyme Q10 (COQ10) 100 MG CAPS Take by mouth.    . diclofenac sodium (VOLTAREN) 1 % GEL Apply 4 g topically 4 (four) times daily. 1 Tube 3  . Fish Oil-Cholecalciferol (FISH OIL + D3) 1200-1000 MG-UNIT CAPS Take by mouth.    . furosemide (LASIX) 80 MG tablet TAKE 1 TABLET BY MOUTH EVERY DAY 90 tablet 3  . hydrALAZINE (APRESOLINE) 25 MG tablet TAKE 1 TABLET BY MOUTH THREE TIMES A DAY 270 tablet 1  . hydrALAZINE (APRESOLINE) 50 MG tablet Take 50 mg by mouth 3 (three) times daily.    Marland Kitchen KLOR-CON M20 20 MEQ tablet TAKE 1 TABLET BY MOUTH EVERY DAY 90 tablet 1  . labetalol (NORMODYNE) 200 MG tablet Take 200 mg by mouth 2 (two) times daily.    Marland Kitchen losartan (COZAAR) 100 MG tablet TAKE 1 TABLET BY MOUTH EVERYDAY AT BEDTIME    . Multiple Vitamins-Minerals (CENTRUM SILVER PO) Take 1 tablet by mouth daily.     . nitroGLYCERIN (NITROSTAT) 0.4 MG SL tablet Place 0.4 mg under the tongue every 5 (five) minutes as needed for chest pain. Reported on 05/28/2016    . pantoprazole (PROTONIX) 40 MG tablet TAKE 1 TABLET BY MOUTH EVERY DAY 90 tablet 3  . Polyethyl Glycol-Propyl Glycol  (SYSTANE FREE OP) Place 1 drop into both eyes daily as needed.    Marland Kitchen tiZANidine (ZANAFLEX) 4 MG tablet Take 1 tablet (4 mg total) by mouth every 8 (eight) hours as needed for muscle spasms. 30 tablet 0  . vitamin B-12 (CYANOCOBALAMIN) 1000 MCG tablet Take 500 mcg by mouth every other day.     . vitamin C (ASCORBIC ACID) 500 MG tablet Take 500 mg by mouth daily as needed (for cold).      No facility-administered medications prior to visit.    ROS: Review of Systems  Constitutional: Negative for activity change, appetite change, chills, fatigue and unexpected weight change.  HENT: Negative for congestion, mouth sores and sinus pressure.   Eyes: Negative for visual disturbance.  Respiratory: Negative for cough and chest tightness.   Gastrointestinal: Negative for abdominal pain and nausea.  Genitourinary: Negative for difficulty urinating, frequency and vaginal pain.  Musculoskeletal: Negative for back pain and gait problem.  Skin: Negative for pallor and rash.  Neurological: Negative for dizziness, tremors, weakness, numbness and headaches.  Psychiatric/Behavioral: Negative for confusion, sleep disturbance and suicidal ideas.    Objective:  BP (!) 140/52 (BP Location: Left Arm, Patient Position: Sitting, Cuff Size: Large)   Pulse 68   Temp 98 F (36.7 C) (Oral)  Ht 5\' 3"  (1.6 m)   Wt 235 lb (106.6 kg)   SpO2 96%   BMI 41.63 kg/m   BP Readings from Last 3 Encounters:  04/29/20 (!) 140/52  10/30/19 (!) 142/68  06/29/19 140/80    Wt Readings from Last 3 Encounters:  04/29/20 235 lb (106.6 kg)  10/30/19 237 lb (107.5 kg)  06/29/19 238 lb (108 kg)    Physical Exam Constitutional:      General: She is not in acute distress.    Appearance: She is well-developed. She is obese.  HENT:     Head: Normocephalic.     Right Ear: External ear normal.     Left Ear: External ear normal.     Nose: Nose normal.  Eyes:     General:        Right eye: No discharge.        Left eye:  No discharge.     Conjunctiva/sclera: Conjunctivae normal.     Pupils: Pupils are equal, round, and reactive to light.  Neck:     Thyroid: No thyromegaly.     Vascular: No JVD.     Trachea: No tracheal deviation.  Cardiovascular:     Rate and Rhythm: Normal rate and regular rhythm.     Heart sounds: Normal heart sounds.  Pulmonary:     Effort: No respiratory distress.     Breath sounds: No stridor. No wheezing.  Abdominal:     General: Bowel sounds are normal. There is no distension.     Palpations: Abdomen is soft. There is no mass.     Tenderness: There is no abdominal tenderness. There is no guarding or rebound.  Musculoskeletal:        General: No tenderness.     Cervical back: Normal range of motion and neck supple.  Lymphadenopathy:     Cervical: No cervical adenopathy.  Skin:    Findings: No erythema or rash.  Neurological:     Cranial Nerves: No cranial nerve deficit.     Motor: No abnormal muscle tone.     Coordination: Coordination normal.     Deep Tendon Reflexes: Reflexes normal.  Psychiatric:        Behavior: Behavior normal.        Thought Content: Thought content normal.        Judgment: Judgment normal.     Lab Results  Component Value Date   WBC 7.3 12/28/2018   HGB 12.7 12/28/2018   HCT 38.7 12/28/2018   PLT 237.0 12/28/2018   GLUCOSE 101 (H) 06/29/2019   CHOL 161 12/28/2018   TRIG 126.0 12/28/2018   HDL 28.70 (L) 12/28/2018   LDLDIRECT 127.9 07/14/2011   LDLCALC 107 (H) 12/28/2018   ALT 18 12/28/2018   AST 15 12/28/2018   NA 140 06/29/2019   K 4.8 06/29/2019   CL 108 06/29/2019   CREATININE 1.59 (H) 06/29/2019   BUN 25 (H) 06/29/2019   CO2 26 06/29/2019   TSH 3.88 12/28/2018   INR 0.98 06/07/2014   HGBA1C 6.2 06/29/2019   MICROALBUR 1.4 12/28/2018    DG Chest 2 View  Result Date: 06/27/2018 CLINICAL DATA:  Cough, wheezing, and intermittent sub sternal chest pain for the past 2 years. History of diabetes, hypertension, chronic renal  disease. EXAM: CHEST - 2 VIEW COMPARISON:  Chest x-ray of June 07 2614. FINDINGS: The lungs are well-expanded. The interstitial markings are mildly prominent though stable. There is no alveolar infiltrate or pleural effusion. The cardiac  silhouette is enlarged but stable. The pulmonary vascularity is a slightly more prominent overall. There is calcification in the wall of the aortic arch. The bony thorax exhibits no acute abnormality. IMPRESSION: Findings consistent with low-grade CHF with mild pulmonary vascular congestion. No alveolar pneumonia nor pleural effusion. Thoracic aortic atherosclerosis. Electronically Signed   By: David  Martinique M.D.   On: 06/27/2018 09:48    Assessment & Plan:    Walker Kehr, MD

## 2020-04-29 NOTE — Assessment & Plan Note (Signed)
Repeat tests

## 2020-04-29 NOTE — Assessment & Plan Note (Signed)
Labs

## 2020-04-29 NOTE — Assessment & Plan Note (Signed)
On B12 

## 2020-04-29 NOTE — Addendum Note (Signed)
Addended by: Boris Lown B on: 04/29/2020 09:36 AM   Modules accepted: Orders

## 2020-04-30 NOTE — Addendum Note (Signed)
Addended by: Cresenciano Lick on: 04/30/2020 12:56 PM   Modules accepted: Orders

## 2020-05-03 ENCOUNTER — Encounter: Payer: Self-pay | Admitting: Internal Medicine

## 2020-05-16 ENCOUNTER — Ambulatory Visit (HOSPITAL_COMMUNITY)
Admission: RE | Admit: 2020-05-16 | Discharge: 2020-05-16 | Disposition: A | Discharge status: Home / Self Care | Payer: Medicare HMO | Source: Ambulatory Visit | Attending: Internal Medicine | Admitting: Internal Medicine

## 2020-05-16 ENCOUNTER — Other Ambulatory Visit: Payer: Self-pay

## 2020-05-16 DIAGNOSIS — I6523 Occlusion and stenosis of bilateral carotid arteries: Secondary | ICD-10-CM | POA: Insufficient documentation

## 2020-05-18 ENCOUNTER — Other Ambulatory Visit: Payer: Self-pay | Admitting: Internal Medicine

## 2020-05-27 DIAGNOSIS — H40023 Open angle with borderline findings, high risk, bilateral: Secondary | ICD-10-CM | POA: Diagnosis not present

## 2020-05-27 DIAGNOSIS — H04123 Dry eye syndrome of bilateral lacrimal glands: Secondary | ICD-10-CM | POA: Diagnosis not present

## 2020-06-14 ENCOUNTER — Ambulatory Visit (INDEPENDENT_AMBULATORY_CARE_PROVIDER_SITE_OTHER): Payer: Medicare HMO

## 2020-06-14 ENCOUNTER — Other Ambulatory Visit: Payer: Self-pay

## 2020-06-14 VITALS — BP 118/60 | HR 71 | Temp 98.1°F | Resp 16 | Ht 63.0 in | Wt 228.6 lb

## 2020-06-14 DIAGNOSIS — Z0000 Encounter for general adult medical examination without abnormal findings: Secondary | ICD-10-CM

## 2020-06-14 NOTE — Patient Instructions (Signed)
Bridget Oliver , Thank you for taking time to come for your Medicare Wellness Visit. I appreciate your ongoing commitment to your health goals. Please review the following plan we discussed and let me know if I can assist you in the future.   Screening recommendations/referrals: Colonoscopy: 12/21/2017; no repeat due to age  79: 04/24/2020; due every 1-2 years Bone Density: 03/16/2018 Recommended yearly ophthalmology/optometry visit for glaucoma screening and checkup Recommended yearly dental visit for hygiene and checkup  Vaccinations: Influenza vaccine: 08/11/2019 Pneumococcal vaccine: completed Tdap vaccine: 11/10/2011; due every 10 years Shingles vaccine: completed   Covid-19: completed  Advanced directives: Please bring a copy of your health care power of attorney and living will to the office at your convenience.  Conditions/risks identified: Yes; Please continue to do your personal lifestyle choices by: daily care of teeth and gums, regular physical activity (goal should be 5 days a week for 30 minutes), eat a healthy diet, avoid tobacco and drug use, limiting any alcohol intake, taking a low-dose aspirin (if not allergic or have been advised by your provider otherwise) and taking vitamins and minerals as recommended by your provider. Continue doing brain stimulating activities (puzzles, reading, adult coloring books, staying active) to keep memory sharp. Continue to eat heart healthy diet (full of fruits, vegetables, whole grains, lean protein, water--limit salt, fat, and sugar intake) and increase physical activity as tolerated.  Next appointment: Please schedule your next Medicare Wellness Visit with your Nurse Health Advisor in 1 year.   Preventive Care 58 Years and Older, Female Preventive care refers to lifestyle choices and visits with your health care provider that can promote health and wellness. What does preventive care include?  A yearly physical exam. This is also called  an annual well check.  Dental exams once or twice a year.  Routine eye exams. Ask your health care provider how often you should have your eyes checked.  Personal lifestyle choices, including:  Daily care of your teeth and gums.  Regular physical activity.  Eating a healthy diet.  Avoiding tobacco and drug use.  Limiting alcohol use.  Practicing safe sex.  Taking low-dose aspirin every day.  Taking vitamin and mineral supplements as recommended by your health care provider. What happens during an annual well check? The services and screenings done by your health care provider during your annual well check will depend on your age, overall health, lifestyle risk factors, and family history of disease. Counseling  Your health care provider may ask you questions about your:  Alcohol use.  Tobacco use.  Drug use.  Emotional well-being.  Home and relationship well-being.  Sexual activity.  Eating habits.  History of falls.  Memory and ability to understand (cognition).  Work and work Statistician.  Reproductive health. Screening  You may have the following tests or measurements:  Height, weight, and BMI.  Blood pressure.  Lipid and cholesterol levels. These may be checked every 5 years, or more frequently if you are over 50 years old.  Skin check.  Lung cancer screening. You may have this screening every year starting at age 53 if you have a 30-pack-year history of smoking and currently smoke or have quit within the past 15 years.  Fecal occult blood test (FOBT) of the stool. You may have this test every year starting at age 48.  Flexible sigmoidoscopy or colonoscopy. You may have a sigmoidoscopy every 5 years or a colonoscopy every 10 years starting at age 43.  Hepatitis C blood test.  Hepatitis B blood test.  Sexually transmitted disease (STD) testing.  Diabetes screening. This is done by checking your blood sugar (glucose) after you have not eaten  for a while (fasting). You may have this done every 1-3 years.  Bone density scan. This is done to screen for osteoporosis. You may have this done starting at age 78.  Mammogram. This may be done every 1-2 years. Talk to your health care provider about how often you should have regular mammograms. Talk with your health care provider about your test results, treatment options, and if necessary, the need for more tests. Vaccines  Your health care provider may recommend certain vaccines, such as:  Influenza vaccine. This is recommended every year.  Tetanus, diphtheria, and acellular pertussis (Tdap, Td) vaccine. You may need a Td booster every 10 years.  Zoster vaccine. You may need this after age 17.  Pneumococcal 13-valent conjugate (PCV13) vaccine. One dose is recommended after age 49.  Pneumococcal polysaccharide (PPSV23) vaccine. One dose is recommended after age 6. Talk to your health care provider about which screenings and vaccines you need and how often you need them. This information is not intended to replace advice given to you by your health care provider. Make sure you discuss any questions you have with your health care provider. Document Released: 12/06/2015 Document Revised: 07/29/2016 Document Reviewed: 09/10/2015 Elsevier Interactive Patient Education  2017 [place] Prevention in the Home Falls can cause injuries. They can happen to people of all ages. There are many things you can do to make your home safe and to help prevent falls. What can I do on the outside of my home?  Regularly fix the edges of walkways and driveways and fix any cracks.  Remove anything that might make you trip as you walk through a door, such as a raised step or threshold.  Trim any bushes or trees on the path to your home.  Use bright outdoor lighting.  Clear any walking paths of anything that might make someone trip, such as rocks or tools.  Regularly check to see if  handrails are loose or broken. Make sure that both sides of any steps have handrails.  Any raised decks and porches should have guardrails on the edges.  Have any leaves, snow, or ice cleared regularly.  Use sand or salt on walking paths during winter.  Clean up any spills in your garage right away. This includes oil or grease spills. What can I do in the bathroom?  Use night lights.  Install grab bars by the toilet and in the tub and shower. Do not use towel bars as grab bars.  Use non-skid mats or decals in the tub or shower.  If you need to sit down in the shower, use a plastic, non-slip stool.  Keep the floor dry. Clean up any water that spills on the floor as soon as it happens.  Remove soap buildup in the tub or shower regularly.  Attach bath mats securely with double-sided non-slip rug tape.  Do not have throw rugs and other things on the floor that can make you trip. What can I do in the bedroom?  Use night lights.  Make sure that you have a light by your bed that is easy to reach.  Do not use any sheets or blankets that are too big for your bed. They should not hang down onto the floor.  Have a firm chair that has side arms. You can use this  for support while you get dressed.  Do not have throw rugs and other things on the floor that can make you trip. What can I do in the kitchen?  Clean up any spills right away.  Avoid walking on wet floors.  Keep items that you use a lot in easy-to-reach places.  If you need to reach something above you, use a strong step stool that has a grab bar.  Keep electrical cords out of the way.  Do not use floor polish or wax that makes floors slippery. If you must use wax, use non-skid floor wax.  Do not have throw rugs and other things on the floor that can make you trip. What can I do with my stairs?  Do not leave any items on the stairs.  Make sure that there are handrails on both sides of the stairs and use them. Fix  handrails that are broken or loose. Make sure that handrails are as long as the stairways.  Check any carpeting to make sure that it is firmly attached to the stairs. Fix any carpet that is loose or worn.  Avoid having throw rugs at the top or bottom of the stairs. If you do have throw rugs, attach them to the floor with carpet tape.  Make sure that you have a light switch at the top of the stairs and the bottom of the stairs. If you do not have them, ask someone to add them for you. What else can I do to help prevent falls?  Wear shoes that:  Do not have high heels.  Have rubber bottoms.  Are comfortable and fit you well.  Are closed at the toe. Do not wear sandals.  If you use a stepladder:  Make sure that it is fully opened. Do not climb a closed stepladder.  Make sure that both sides of the stepladder are locked into place.  Ask someone to hold it for you, if possible.  Clearly mark and make sure that you can see:  Any grab bars or handrails.  First and last steps.  Where the edge of each step is.  Use tools that help you move around (mobility aids) if they are needed. These include:  Canes.  Walkers.  Scooters.  Crutches.  Turn on the lights when you go into a dark area. Replace any light bulbs as soon as they burn out.  Set up your furniture so you have a clear path. Avoid moving your furniture around.  If any of your floors are uneven, fix them.  If there are any pets around you, be aware of where they are.  Review your medicines with your doctor. Some medicines can make you feel dizzy. This can increase your chance of falling. Ask your doctor what other things that you can do to help prevent falls. This information is not intended to replace advice given to you by your health care provider. Make sure you discuss any questions you have with your health care provider. Document Released: 09/05/2009 Document Revised: 04/16/2016 Document Reviewed:  12/14/2014 Elsevier Interactive Patient Education  2017 [employer].

## 2020-06-14 NOTE — Progress Notes (Signed)
Subjective:   NAYDENE KAMROWSKI is a 79 y.o. female who presents for Medicare Annual (Subsequent) preventive examination.  Review of Systems    No ROS. Medicare Wellness Visit Cardiac Risk Factors include: advanced age (>43men, >17 women);diabetes mellitus;dyslipidemia;family history of premature cardiovascular disease;hypertension;obesity (BMI >30kg/m2)     Objective:    Today's Vitals   06/14/20 1420  BP: (!) 118/60  Pulse: 71  Resp: 16  Temp: 98.1 F (36.7 C)  SpO2: 96%  Weight: (!) 228 lb 9.6 oz (103.7 kg)  Height: 5\' 3"  (1.6 m)  PainSc: 0-No pain   Body mass index is 40.49 kg/m.  Advanced Directives 06/14/2020 01/29/2016  Does Patient Have a Medical Advance Directive? No No  Would patient like information on creating a medical advance directive? No - Patient declined -    Current Medications (verified) Outpatient Encounter Medications as of 06/14/2020  Medication Sig  . acetaminophen (TYLENOL) 325 MG tablet Take 2 tablets (650 mg total) by mouth every 6 (six) hours as needed for moderate pain.  Marland Kitchen acetaminophen-codeine (TYLENOL #3) 300-30 MG tablet Take 1 tablet by mouth every 8 (eight) hours as needed for moderate pain or severe pain.  Marland Kitchen allopurinol (ZYLOPRIM) 100 MG tablet TAKE 1/2 TABLET BY MOUTH DAILY  . atorvastatin (LIPITOR) 40 MG tablet TAKE 1 TABLET BY MOUTH EVERY DAY  . Cholecalciferol (VITAMIN D3) 2000 UNITS capsule Take 1 capsule (2,000 Units total) by mouth daily.  . Coenzyme Q10 (COQ10) 100 MG CAPS Take by mouth.  . diclofenac sodium (VOLTAREN) 1 % GEL Apply 4 g topically 4 (four) times daily.  . Fish Oil-Cholecalciferol (FISH OIL + D3) 1200-1000 MG-UNIT CAPS Take by mouth.  . furosemide (LASIX) 80 MG tablet TAKE 1 TABLET BY MOUTH EVERY DAY  . hydrALAZINE (APRESOLINE) 25 MG tablet TAKE 1 TABLET BY MOUTH THREE TIMES A DAY  . hydrALAZINE (APRESOLINE) 50 MG tablet Take 50 mg by mouth 3 (three) times daily.  Marland Kitchen KLOR-CON M20 20 MEQ tablet TAKE 1 TABLET BY MOUTH  EVERY DAY  . labetalol (NORMODYNE) 200 MG tablet Take 200 mg by mouth 2 (two) times daily.  Marland Kitchen losartan (COZAAR) 100 MG tablet TAKE 1 TABLET BY MOUTH EVERYDAY AT BEDTIME  . Multiple Vitamins-Minerals (CENTRUM SILVER PO) Take 1 tablet by mouth daily.   . nitroGLYCERIN (NITROSTAT) 0.4 MG SL tablet Place 0.4 mg under the tongue every 5 (five) minutes as needed for chest pain. Reported on 05/28/2016  . pantoprazole (PROTONIX) 40 MG tablet TAKE 1 TABLET BY MOUTH EVERY DAY  . Polyethyl Glycol-Propyl Glycol (SYSTANE FREE OP) Place 1 drop into both eyes daily as needed.  Marland Kitchen tiZANidine (ZANAFLEX) 4 MG tablet Take 1 tablet (4 mg total) by mouth every 8 (eight) hours as needed for muscle spasms.  . vitamin B-12 (CYANOCOBALAMIN) 1000 MCG tablet Take 500 mcg by mouth every other day.   . vitamin C (ASCORBIC ACID) 500 MG tablet Take 500 mg by mouth daily as needed (for cold).    No facility-administered encounter medications on file as of 06/14/2020.    Allergies (verified) Oxycodone   History: Past Medical History:  Diagnosis Date  . CRI (chronic renal insufficiency)    Stage 3  . Diabetes mellitus 2011   Type II diet controlled  . Diarrhea   . GERD (gastroesophageal reflux disease)   . Gout   . Hyperglycemia   . Hyperlipidemia   . Hypertension   . Lactose intolerance   . Macular degeneration   .  Obesity   . Swelling of lower limb    Past Surgical History:  Procedure Laterality Date  . ABDOMINAL HYSTERECTOMY  1985  . APPENDECTOMY  1985  . CATARACT EXTRACTION  2010 & 2011   bilateral  . [place]  . LUMBAR LAMINECTOMY  2007  . PARS PLANA VITRECTOMY W/ REPAIR OF MACULAR HOLE  2010 & 2011   Bilateral   Family History  Problem Relation Age of Onset  . Hypertension Mother   . Hypertension Father   . Cancer Other        Lung  . Colon cancer Neg Hx   . Esophageal cancer Neg Hx   . Stomach cancer Neg Hx   . Rectal cancer Neg Hx    Social History   Socioeconomic History    . Marital status: Married    Spouse name: Not on file  . Number of children: Not on file  . Years of education: Not on file  . Highest education level: Not on file  Occupational History  . Not on file  Tobacco Use  . Smoking status: Never Smoker  . Smokeless tobacco: Never Used  Substance and Sexual Activity  . Alcohol use: Yes    Alcohol/week: 0.0 standard drinks    Comment: rarely  . Drug use: No  . Sexual activity: Not on file  Other Topics Concern  . Not on file  Social History Narrative  . Not on file   Social Determinants of Health   Financial Resource Strain: Low Risk   . Difficulty of Paying Living Expenses: Not hard at all  Food Insecurity: No Food Insecurity  . Worried About Charity fundraiser in the Last Year: Never true  . Ran Out of Food in the Last Year: Never true  Transportation Needs: No Transportation Needs  . Lack of Transportation (Medical): No  . Lack of Transportation (Non-Medical): No  Physical Activity:   . Days of Exercise per Week:   . Minutes of Exercise per Session:   Stress: No Stress Concern Present  . Feeling of Stress : Not at all  Social Connections: Socially Integrated  . Frequency of Communication with Friends and Family: More than three times a week  . Frequency of Social Gatherings with Friends and Family: More than three times a week  . Attends Religious Services: More than 4 times per year  . Active Member of Clubs or Organizations: Yes  . Attends Archivist Meetings: More than 4 times per year  . Marital Status: Married    Tobacco Counseling Counseling given: No   Clinical Intake:  Pre-visit preparation completed: Yes  Pain : No/denies pain Pain Score: 0-No pain     BMI - recorded: 40.49 Nutritional Status: BMI > 30  Obese Nutritional Risks: None Diabetes: Yes CBG done?: No Did pt. bring in CBG monitor from home?: No  How often do you need to have someone help you when you read instructions,  pamphlets, or other written materials from your doctor or pharmacy?: 1 - Never What is the last grade level you completed in school?: Bachelor's Degree  Diabetic? yes  Interpreter Needed?: No  Information entered by :: Nikoloz Huy N. Armen Waring, LPN   Activities of Daily Living In your present state of health, do you have any difficulty performing the following activities: 06/14/2020  Hearing? N  Vision? N  Difficulty concentrating or making decisions? N  Walking or climbing stairs? N  Dressing or bathing? N  Doing  errands, shopping? N  Preparing Food and eating ? N  Using the Toilet? N  In the past six months, have you accidently leaked urine? N  Do you have problems with loss of bowel control? N  Managing your Medications? N  Managing your Finances? N  Housekeeping or managing your Housekeeping? N  Some recent data might be hidden    Patient Care Team: [name], [name], MD as PCP - General Edrick Oh, MD (Nephrology) Monna Fam, MD (Ophthalmology) Mcarthur Rossetti, MD (Orthopedic Surgery)  Indicate any recent Medical Services you may have received from other than [facility] providers in the past year (date may be approximate).     Assessment:   This is a routine wellness examination for Jaicey.  Hearing/Vision screen No exam data present  Dietary issues and exercise activities discussed: Current Exercise Habits: The patient does not participate in regular exercise at present, Exercise limited by: None identified  Goals    .  Exercise 150 minutes per week (moderate activity)      Wants to exercise more; bike x 30 minutes a day or walks at the mall Will start out slow;after 3 weeks; start with short burst of faster activity as tolerated     .  Patient Stated (pt-stated)      I would like to be more active by exercising more.      Depression Screen PHQ 2/9 Scores 04/29/2020 12/28/2018 12/27/2017 06/24/2017 01/29/2016 08/16/2015  PHQ - 2 Score 0 0 0 0 0 0    Fall  Risk Fall Risk  06/14/2020 04/29/2020 12/28/2018 12/27/2017 01/29/2016  Falls in the past year? 0 0 0 No No  Number falls in past yr: 0 0 - - -  Injury with Fall? 0 0 - - -  Risk for fall due to : No Fall Risks - - - -  Follow up Falls evaluation completed - Falls evaluation completed - -    Any stairs in or around the home? No  If so, are there any without handrails? No  Home free of loose throw rugs in walkways, pet beds, electrical cords, etc? Yes  Adequate lighting in your home to reduce risk of falls? Yes   ASSISTIVE DEVICES UTILIZED TO PREVENT FALLS:  Life alert? No  Use of a cane, walker or w/c? No  Grab bars in the bathroom? No  Shower chair or bench in shower? No  Elevated toilet seat or a handicapped toilet? No   TIMED UP AND GO:  Was the test performed? No .  Length of time to ambulate 10 feet: 0 sec.   Gait steady and fast without use of assistive device  Cognitive Function:     6CIT Screen 06/14/2020  What Year? 0 points  What month? 0 points  What time? 0 points  Count back from 20 0 points  Months in reverse 0 points  Repeat phrase 0 points  Total Score 0    Immunizations Immunization History  Administered Date(s) Administered  . Fluad Quad(high Dose 65+) 08/11/2019  . Influenza Whole 10/07/2010, 07/25/2011, 08/26/2012  . Influenza, High Dose Seasonal PF 09/14/2013, 09/11/2017, 09/13/2018  . Influenza,inj,Quad PF,6+ Mos 07/25/2014, 08/16/2015  . Pneumococcal Conjugate-13 11/26/2014  . Pneumococcal Polysaccharide-23 11/10/2011  . Tdap 11/10/2011  . Zoster 02/28/2008  . Zoster Recombinat (Shingrix) 09/08/2018    TDAP status: Up to date Flu Vaccine status: Up to date Pneumococcal vaccine status: Up to date Covid-19 vaccine status: Completed vaccines  Qualifies for Shingles Vaccine? Yes  Zostavax completed Yes   Shingrix Completed?: Yes  Screening Tests Health Maintenance  Topic Date Due  . FOOT EXAM  Never done  . COVID-19 Vaccine (1) Never done   . OPHTHALMOLOGY EXAM  03/25/2017  . INFLUENZA VACCINE  06/23/2020  . HEMOGLOBIN A1C  10/29/2020  . TETANUS/TDAP  11/09/2021  . DEXA SCAN  Completed  . Hepatitis C Screening  Completed  . PNA vac Low Risk Adult  Completed    Health Maintenance  Health Maintenance Due  Topic Date Due  . FOOT EXAM  Never done  . COVID-19 Vaccine (1) Never done  . OPHTHALMOLOGY EXAM  03/25/2017    Colorectal cancer screening: No longer required.  Mammogram status: Completed 04/24/2020. Repeat every year Bone Density status: Completed 03/16/2018. Results reflect: Bone density results: NORMAL. Repeat every 2-3 years.  Lung Cancer Screening: (Low Dose CT Chest recommended if Age 68-80 years, 30 pack-year currently smoking OR have quit w/in 15years.) does not qualify.   Lung Cancer Screening Referral: no  Additional Screening:  Hepatitis C Screening: does qualify; Completed yes  Vision Screening: Recommended annual ophthalmology exams for early detection of glaucoma and other disorders of the eye. Is the patient up to date with their annual eye exam?  Yes  Who is the provider or what is the name of the office in which the patient attends annual eye exams? Monna Fam, MD  If pt is not established with a provider, would they like to be referred to a provider to establish care? No .   Dental Screening: Recommended annual dental exams for proper oral hygiene  Community Resource Referral / Chronic Care Management: CRR required this visit?  No   CCM required this visit?  No      Plan:     I have personally reviewed and noted the following in the patient's chart:   . Medical and social history . Use of alcohol, tobacco or illicit drugs  . Current medications and supplements . Functional ability and status . Nutritional status . Physical activity . Advanced directives . List of other physicians . Hospitalizations, surgeries, and ER visits in previous 12 months . Vitals . Screenings to  include cognitive, depression, and falls . Referrals and appointments  In addition, I have reviewed and discussed with patient certain preventive protocols, quality metrics, and best practice recommendations. A written personalized care plan for preventive services as well as general preventive health recommendations were provided to patient.     Sheral Flow, LPN   0/08/2724   Nurse Notes: n/a

## 2020-06-22 ENCOUNTER — Other Ambulatory Visit: Payer: Self-pay | Admitting: Internal Medicine

## 2020-07-23 ENCOUNTER — Other Ambulatory Visit: Payer: Self-pay | Admitting: Internal Medicine

## 2020-08-05 DIAGNOSIS — N183 Chronic kidney disease, stage 3 unspecified: Secondary | ICD-10-CM | POA: Diagnosis not present

## 2020-08-05 DIAGNOSIS — E1122 Type 2 diabetes mellitus with diabetic chronic kidney disease: Secondary | ICD-10-CM | POA: Diagnosis not present

## 2020-08-05 DIAGNOSIS — N189 Chronic kidney disease, unspecified: Secondary | ICD-10-CM | POA: Diagnosis not present

## 2020-08-05 DIAGNOSIS — Z8739 Personal history of other diseases of the musculoskeletal system and connective tissue: Secondary | ICD-10-CM | POA: Diagnosis not present

## 2020-08-05 DIAGNOSIS — E785 Hyperlipidemia, unspecified: Secondary | ICD-10-CM | POA: Diagnosis not present

## 2020-08-05 DIAGNOSIS — I129 Hypertensive chronic kidney disease with stage 1 through stage 4 chronic kidney disease, or unspecified chronic kidney disease: Secondary | ICD-10-CM | POA: Diagnosis not present

## 2020-08-05 DIAGNOSIS — N2581 Secondary hyperparathyroidism of renal origin: Secondary | ICD-10-CM | POA: Diagnosis not present

## 2020-08-19 ENCOUNTER — Ambulatory Visit (INDEPENDENT_AMBULATORY_CARE_PROVIDER_SITE_OTHER): Payer: Medicare HMO

## 2020-08-19 ENCOUNTER — Other Ambulatory Visit: Payer: Self-pay

## 2020-08-19 DIAGNOSIS — Z23 Encounter for immunization: Secondary | ICD-10-CM | POA: Diagnosis not present

## 2020-08-27 ENCOUNTER — Ambulatory Visit: Payer: Medicare HMO | Attending: Internal Medicine

## 2020-08-27 DIAGNOSIS — Z23 Encounter for immunization: Secondary | ICD-10-CM

## 2020-08-27 NOTE — Progress Notes (Signed)
   Covid-19 Vaccination Clinic  Name:  Bridget Oliver    MRN: [id number] DOB: 1941/05/14  08/27/2020  Bridget Oliver was observed post Covid-19 immunization for 15 minutes without incident. She was provided with Vaccine Information Sheet and instruction to access the V-Safe system.   Bridget Oliver was instructed to call 911 with any severe reactions post vaccine: Marland Kitchen Difficulty breathing  . Swelling of face and throat  . A fast heartbeat  . A bad rash all over body  . Dizziness and weakness

## 2020-10-10 DIAGNOSIS — R69 Illness, unspecified: Secondary | ICD-10-CM | POA: Diagnosis not present

## 2020-10-30 ENCOUNTER — Other Ambulatory Visit: Payer: Self-pay

## 2020-10-30 ENCOUNTER — Ambulatory Visit (INDEPENDENT_AMBULATORY_CARE_PROVIDER_SITE_OTHER): Payer: Medicare HMO | Admitting: Internal Medicine

## 2020-10-30 ENCOUNTER — Encounter: Payer: Self-pay | Admitting: Internal Medicine

## 2020-10-30 VITALS — BP 142/58 | HR 68 | Temp 98.2°F | Wt 221.8 lb

## 2020-10-30 DIAGNOSIS — I119 Hypertensive heart disease without heart failure: Secondary | ICD-10-CM | POA: Diagnosis not present

## 2020-10-30 DIAGNOSIS — N1831 Chronic kidney disease, stage 3a: Secondary | ICD-10-CM | POA: Diagnosis not present

## 2020-10-30 DIAGNOSIS — E785 Hyperlipidemia, unspecified: Secondary | ICD-10-CM | POA: Diagnosis not present

## 2020-10-30 DIAGNOSIS — I129 Hypertensive chronic kidney disease with stage 1 through stage 4 chronic kidney disease, or unspecified chronic kidney disease: Secondary | ICD-10-CM | POA: Diagnosis not present

## 2020-10-30 DIAGNOSIS — E538 Deficiency of other specified B group vitamins: Secondary | ICD-10-CM | POA: Diagnosis not present

## 2020-10-30 DIAGNOSIS — E1122 Type 2 diabetes mellitus with diabetic chronic kidney disease: Secondary | ICD-10-CM

## 2020-10-30 LAB — CBC WITH DIFFERENTIAL/PLATELET
Basophils Absolute: 0 10*3/uL (ref 0.0–0.1)
Basophils Relative: 0.7 % (ref 0.0–3.0)
Eosinophils Absolute: 0.1 10*3/uL (ref 0.0–0.7)
Eosinophils Relative: 1.7 % (ref 0.0–5.0)
HCT: 36.7 % (ref 36.0–46.0)
Hemoglobin: 11.9 g/dL — ABNORMAL LOW (ref 12.0–15.0)
Lymphocytes Relative: 23.5 % (ref 12.0–46.0)
Lymphs Abs: 1.6 10*3/uL (ref 0.7–4.0)
MCHC: 32.3 g/dL (ref 30.0–36.0)
MCV: 84.3 fl (ref 78.0–100.0)
Monocytes Absolute: 0.8 10*3/uL (ref 0.1–1.0)
Monocytes Relative: 10.8 % (ref 3.0–12.0)
Neutro Abs: 4.4 10*3/uL (ref 1.4–7.7)
Neutrophils Relative %: 63.3 % (ref 43.0–77.0)
Platelets: 206 10*3/uL (ref 150.0–400.0)
RBC: 4.36 Mil/uL (ref 3.87–5.11)
RDW: 14.7 % (ref 11.5–15.5)
WBC: 7 10*3/uL (ref 4.0–10.5)

## 2020-10-30 LAB — URINALYSIS, ROUTINE W REFLEX MICROSCOPIC
Bilirubin Urine: NEGATIVE
Hgb urine dipstick: NEGATIVE
Ketones, ur: NEGATIVE
Nitrite: NEGATIVE
RBC / HPF: NONE SEEN (ref 0–?)
Specific Gravity, Urine: 1.015 (ref 1.000–1.030)
Total Protein, Urine: NEGATIVE
Urine Glucose: NEGATIVE
Urobilinogen, UA: 0.2 (ref 0.0–1.0)
pH: 5.5 (ref 5.0–8.0)

## 2020-10-30 LAB — LIPID PANEL
Cholesterol: 137 mg/dL (ref 0–200)
HDL: 28 mg/dL — ABNORMAL LOW (ref >39.00)
LDL Cholesterol: 86 mg/dL (ref 0–99)
NonHDL: 108.78
Total CHOL/HDL Ratio: 5
Triglycerides: 112 mg/dL (ref 0.0–149.0)
VLDL: 22.4 mg/dL (ref 0.0–40.0)

## 2020-10-30 LAB — COMPREHENSIVE METABOLIC PANEL
ALT: 16 U/L (ref 0–35)
AST: 18 U/L (ref 0–37)
Albumin: 3.7 g/dL (ref 3.5–5.2)
Alkaline Phosphatase: 77 U/L (ref 39–117)
BUN: 26 mg/dL — ABNORMAL HIGH (ref 6–23)
CO2: 25 mEq/L (ref 19–32)
Calcium: 9.2 mg/dL (ref 8.4–10.5)
Chloride: 104 mEq/L (ref 96–112)
Creatinine, Ser: 1.86 mg/dL — ABNORMAL HIGH (ref 0.40–1.20)
GFR: 25.63 mL/min — ABNORMAL LOW (ref >60.00)
Glucose, Bld: 110 mg/dL — ABNORMAL HIGH (ref 70–99)
Potassium: 4.3 mEq/L (ref 3.5–5.1)
Sodium: 137 mEq/L (ref 135–145)
Total Bilirubin: 0.5 mg/dL (ref 0.2–1.2)
Total Protein: 6.7 g/dL (ref 6.0–8.3)

## 2020-10-30 LAB — HEMOGLOBIN A1C: Hgb A1c MFr Bld: 5.7 % (ref 4.6–6.5)

## 2020-10-30 LAB — TSH: TSH: 3.09 u[IU]/mL (ref 0.35–4.50)

## 2020-10-30 NOTE — Assessment & Plan Note (Signed)
F/u w/Dr Justin Mend next week

## 2020-10-30 NOTE — Assessment & Plan Note (Signed)
  On diet  

## 2020-10-30 NOTE — Assessment & Plan Note (Signed)
Hydralazine, Furosemide, Labetalol, Losartan 

## 2020-10-30 NOTE — Assessment & Plan Note (Signed)
Wt Readings from Last 3 Encounters:  10/30/20 221 lb 12.8 oz (100.6 kg)  06/14/20 (!) 228 lb 9.6 oz (103.7 kg)  04/29/20 235 lb (106.6 kg)

## 2020-10-30 NOTE — Progress Notes (Signed)
Subjective:  Patient ID: Bridget Oliver, female    DOB: Apr 15, 1941  Age: 79 y.o. MRN: [id number]  CC: Follow-up (6 month f/u)   HPI Bridget Oliver presents for hypertension, chronic renal insufficiency, dyslipidemia  Outpatient Medications Prior to Visit  Medication Sig Dispense Refill  . acetaminophen (TYLENOL) 325 MG tablet Take 2 tablets (650 mg total) by mouth every 6 (six) hours as needed for moderate pain. 30 tablet 0  . acetaminophen-codeine (TYLENOL #3) 300-30 MG tablet Take 1 tablet by mouth every 8 (eight) hours as needed for moderate pain or severe pain. 30 tablet 1  . allopurinol (ZYLOPRIM) 100 MG tablet TAKE 1/2 TABLET BY MOUTH EVERY DAY 45 tablet 11  . atorvastatin (LIPITOR) 40 MG tablet TAKE 1 TABLET BY MOUTH EVERY DAY 90 tablet 3  . Cholecalciferol (VITAMIN D3) 2000 UNITS capsule Take 1 capsule (2,000 Units total) by mouth daily. 100 capsule 3  . Coenzyme Q10 (COQ10) 100 MG CAPS Take by mouth.    . diclofenac sodium (VOLTAREN) 1 % GEL Apply 4 g topically 4 (four) times daily. 1 Tube 3  . Fish Oil-Cholecalciferol (FISH OIL + D3) 1200-1000 MG-UNIT CAPS Take by mouth.    . furosemide (LASIX) 80 MG tablet TAKE 1 TABLET BY MOUTH EVERY DAY 90 tablet 3  . hydrALAZINE (APRESOLINE) 25 MG tablet TAKE 1 TABLET BY MOUTH THREE TIMES A DAY 270 tablet 1  . hydrALAZINE (APRESOLINE) 50 MG tablet Take 50 mg by mouth 3 (three) times daily.    Marland Kitchen KLOR-CON M20 20 MEQ tablet TAKE 1 TABLET BY MOUTH EVERY DAY 90 tablet 3  . labetalol (NORMODYNE) 200 MG tablet Take 200 mg by mouth 2 (two) times daily.    Marland Kitchen losartan (COZAAR) 100 MG tablet TAKE 1 TABLET BY MOUTH EVERYDAY AT BEDTIME    . Multiple Vitamins-Minerals (CENTRUM SILVER PO) Take 1 tablet by mouth daily.     . nitroGLYCERIN (NITROSTAT) 0.4 MG SL tablet Place 0.4 mg under the tongue every 5 (five) minutes as needed for chest pain. Reported on 05/28/2016    . pantoprazole (PROTONIX) 40 MG tablet TAKE 1 TABLET BY MOUTH EVERY DAY 90 tablet 3  .  Polyethyl Glycol-Propyl Glycol (SYSTANE FREE OP) Place 1 drop into both eyes daily as needed.    Marland Kitchen tiZANidine (ZANAFLEX) 4 MG tablet Take 1 tablet (4 mg total) by mouth every 8 (eight) hours as needed for muscle spasms. 30 tablet 0  . vitamin B-12 (CYANOCOBALAMIN) 1000 MCG tablet Take 500 mcg by mouth every other day.     . vitamin C (ASCORBIC ACID) 500 MG tablet Take 500 mg by mouth daily as needed (for cold).      No facility-administered medications prior to visit.    ROS: Review of Systems  Constitutional: Negative for activity change, appetite change, chills, fatigue and unexpected weight change.  HENT: Negative for congestion, mouth sores and sinus pressure.   Eyes: Negative for visual disturbance.  Respiratory: Negative for cough and chest tightness.   Gastrointestinal: Negative for abdominal pain and nausea.  Genitourinary: Negative for difficulty urinating, frequency and vaginal pain.  Musculoskeletal: Positive for arthralgias. Negative for back pain and gait problem.  Skin: Negative for pallor and rash.  Neurological: Negative for dizziness, tremors, weakness, numbness and headaches.  Psychiatric/Behavioral: Negative for confusion and sleep disturbance.    Objective:  BP (!) 142/58 (BP Location: Left Arm)   Pulse 68   Temp 98.2 F (36.8 C) (Oral)   Wt 221  lb 12.8 oz (100.6 kg)   SpO2 97%   BMI 39.29 kg/m   BP Readings from Last 3 Encounters:  10/30/20 (!) 142/58  06/14/20 (!) 118/60  04/29/20 (!) 140/52    Wt Readings from Last 3 Encounters:  10/30/20 221 lb 12.8 oz (100.6 kg)  06/14/20 (!) 228 lb 9.6 oz (103.7 kg)  04/29/20 235 lb (106.6 kg)    Physical Exam Constitutional:      General: She is not in acute distress.    Appearance: She is well-developed. She is obese.  HENT:     Head: Normocephalic.     Right Ear: External ear normal.     Left Ear: External ear normal.     Nose: Nose normal.     Mouth/Throat:     Mouth: Oropharynx is clear and moist.   Eyes:     General:        Right eye: No discharge.        Left eye: No discharge.     Conjunctiva/sclera: Conjunctivae normal.     Pupils: Pupils are equal, round, and reactive to light.  Neck:     Thyroid: No thyromegaly.     Vascular: No JVD.     Trachea: No tracheal deviation.  Cardiovascular:     Rate and Rhythm: Normal rate and regular rhythm.     Heart sounds: Normal heart sounds.  Pulmonary:     Effort: No respiratory distress.     Breath sounds: No stridor. No wheezing.  Abdominal:     General: Bowel sounds are normal. There is no distension.     Palpations: Abdomen is soft. There is no mass.     Tenderness: There is no abdominal tenderness. There is no guarding or rebound.  Musculoskeletal:        General: No tenderness or edema.     Cervical back: Normal range of motion and neck supple.  Lymphadenopathy:     Cervical: No cervical adenopathy.  Skin:    Findings: No erythema or rash.  Neurological:     Cranial Nerves: No cranial nerve deficit.     Motor: No abnormal muscle tone.     Coordination: Coordination normal.     Deep Tendon Reflexes: Reflexes normal.  Psychiatric:        Mood and Affect: Mood and affect normal.        Behavior: Behavior normal.        Thought Content: Thought content normal.        Judgment: Judgment normal.     Lab Results  Component Value Date   WBC 8.2 04/29/2020   HGB 12.0 04/29/2020   HCT 36.2 04/29/2020   PLT 221.0 04/29/2020   GLUCOSE 110 (H) 04/29/2020   CHOL 140 04/29/2020   TRIG 110.0 04/29/2020   HDL 27.70 (L) 04/29/2020   LDLDIRECT 127.9 07/14/2011   LDLCALC 91 04/29/2020   ALT 16 04/29/2020   AST 16 04/29/2020   NA 138 04/29/2020   K 4.3 04/29/2020   CL 106 04/29/2020   CREATININE 1.69 (H) 04/29/2020   BUN 29 (H) 04/29/2020   CO2 24 04/29/2020   TSH 3.03 04/29/2020   INR 0.98 06/07/2014   HGBA1C 6.2 04/29/2020   MICROALBUR 1.9 04/29/2020    VAS US CAROTID  Result Date: 05/18/2020 Carotid Arterial  Duplex Study Indications:       Carotid artery disease and Patient denies any cerebrovascular  symptoms. Risk Factors:      Hypertension, hyperlipidemia, Diabetes, no history of                    smoking. Limitations        Today's exam was limited due to the body habitus of the                    patient, the patient's respiratory variation and the high                    bifurcation of the carotid. Comparison Study:  In 07/2016, a carotid duplex showed a RICA velocity of 91/24                    cm/s and a LICA velocity of 509/32 cm/s. Performing Technologist: Wilkie Aye RVT  Examination Guidelines: A complete evaluation includes B-mode imaging, spectral Doppler, color Doppler, and power Doppler as needed of all accessible portions of each vessel. Bilateral testing is considered an integral part of a complete examination. Limited examinations for reoccurring indications may be performed as noted.  Right Carotid Findings: +----------+--------+--------+--------+------------------+--------+           PSV cm/sEDV cm/sStenosisPlaque DescriptionComments +----------+--------+--------+--------+------------------+--------+ CCA Prox  102     14                                         +----------+--------+--------+--------+------------------+--------+ CCA Distal103     14                                         +----------+--------+--------+--------+------------------+--------+ ICA Prox  145     31      1-39%   heterogenous               +----------+--------+--------+--------+------------------+--------+ ICA Mid   121     27                                         +----------+--------+--------+--------+------------------+--------+ ICA Distal120     35                                         +----------+--------+--------+--------+------------------+--------+ ECA       125     5                                           +----------+--------+--------+--------+------------------+--------+ +----------+--------+-------+----------------+-------------------+           PSV cm/sEDV cmsDescribe        Arm Pressure (mmHG) +----------+--------+-------+----------------+-------------------+ Subclavian172            Multiphasic, [id number]                 +----------+--------+-------+----------------+-------------------+ +---------+--------+--+--------+--+---------+ VertebralPSV cm/s51EDV cm/s13Antegrade +---------+--------+--+--------+--+---------+  Left Carotid Findings: +----------+--------+--------+--------+------------------+--------+           PSV cm/sEDV cm/sStenosisPlaque DescriptionComments +----------+--------+--------+--------+------------------+--------+ CCA Prox  126     32                                         +----------+--------+--------+--------+------------------+--------+  CCA Mid   122     21      <50%    heterogenous               +----------+--------+--------+--------+------------------+--------+ CCA Distal122     24                                         +----------+--------+--------+--------+------------------+--------+ ICA Prox  183     32              heterogenous               +----------+--------+--------+--------+------------------+--------+ ICA Mid   171     41      40-59%                             +----------+--------+--------+--------+------------------+--------+ ICA Distal130     31                                         +----------+--------+--------+--------+------------------+--------+ ECA       291     2       >50%    heterogenous               +----------+--------+--------+--------+------------------+--------+ +----------+--------+--------+----------------+-------------------+           PSV cm/sEDV cm/sDescribe        Arm Pressure (mmHG) +----------+--------+--------+----------------+-------------------+ [id number]              Multiphasic, [id number]                 +----------+--------+--------+----------------+-------------------+ +---------+--------+--+--------+-+---------+ VertebralPSV cm/s49EDV cm/s8Antegrade +---------+--------+--+--------+-+---------+   Summary: Right Carotid: Velocities in the right ICA are consistent with a 1-39% stenosis. Left Carotid: Velocities in the left ICA are consistent with a 40-59% stenosis.               Non-hemodynamically significant plaque <50% noted in the CCA. The               ECA appears >50% stenosed. Vertebrals:  Bilateral vertebral arteries demonstrate antegrade flow. Subclavians: Normal flow hemodynamics were seen in bilateral subclavian              arteries. *See table(s) above for measurements and observations. Suggest follow up study in 12 months. Electronically signed by Larae Grooms MD on 05/18/2020 at 8:18:58 AM.    Final     Assessment & Plan:    Walker Kehr, MD

## 2020-10-30 NOTE — Assessment & Plan Note (Signed)
On B12 

## 2020-11-06 DIAGNOSIS — Z8739 Personal history of other diseases of the musculoskeletal system and connective tissue: Secondary | ICD-10-CM | POA: Diagnosis not present

## 2020-11-06 DIAGNOSIS — N2581 Secondary hyperparathyroidism of renal origin: Secondary | ICD-10-CM | POA: Diagnosis not present

## 2020-11-06 DIAGNOSIS — E1122 Type 2 diabetes mellitus with diabetic chronic kidney disease: Secondary | ICD-10-CM | POA: Diagnosis not present

## 2020-11-06 DIAGNOSIS — E785 Hyperlipidemia, unspecified: Secondary | ICD-10-CM | POA: Diagnosis not present

## 2020-11-06 DIAGNOSIS — N183 Chronic kidney disease, stage 3 unspecified: Secondary | ICD-10-CM | POA: Diagnosis not present

## 2020-11-06 DIAGNOSIS — I129 Hypertensive chronic kidney disease with stage 1 through stage 4 chronic kidney disease, or unspecified chronic kidney disease: Secondary | ICD-10-CM | POA: Diagnosis not present

## 2020-12-03 ENCOUNTER — Other Ambulatory Visit: Payer: Self-pay | Admitting: Internal Medicine

## 2021-01-07 DIAGNOSIS — M109 Gout, unspecified: Secondary | ICD-10-CM | POA: Diagnosis not present

## 2021-01-07 DIAGNOSIS — I2510 Atherosclerotic heart disease of native coronary artery without angina pectoris: Secondary | ICD-10-CM | POA: Diagnosis not present

## 2021-01-07 DIAGNOSIS — Z6838 Body mass index (BMI) 38.0-38.9, adult: Secondary | ICD-10-CM | POA: Diagnosis not present

## 2021-01-07 DIAGNOSIS — I10 Essential (primary) hypertension: Secondary | ICD-10-CM | POA: Diagnosis not present

## 2021-01-07 DIAGNOSIS — K219 Gastro-esophageal reflux disease without esophagitis: Secondary | ICD-10-CM | POA: Diagnosis not present

## 2021-01-07 DIAGNOSIS — G8929 Other chronic pain: Secondary | ICD-10-CM | POA: Diagnosis not present

## 2021-01-07 DIAGNOSIS — Z5948 Other specified lack of adequate food: Secondary | ICD-10-CM | POA: Diagnosis not present

## 2021-01-07 DIAGNOSIS — E785 Hyperlipidemia, unspecified: Secondary | ICD-10-CM | POA: Diagnosis not present

## 2021-01-07 DIAGNOSIS — M1990 Unspecified osteoarthritis, unspecified site: Secondary | ICD-10-CM | POA: Diagnosis not present

## 2021-01-07 DIAGNOSIS — Z008 Encounter for other general examination: Secondary | ICD-10-CM | POA: Diagnosis not present

## 2021-01-20 DIAGNOSIS — H04123 Dry eye syndrome of bilateral lacrimal glands: Secondary | ICD-10-CM | POA: Diagnosis not present

## 2021-01-20 DIAGNOSIS — R7309 Other abnormal glucose: Secondary | ICD-10-CM | POA: Diagnosis not present

## 2021-01-20 DIAGNOSIS — Z961 Presence of intraocular lens: Secondary | ICD-10-CM | POA: Diagnosis not present

## 2021-01-20 DIAGNOSIS — H40023 Open angle with borderline findings, high risk, bilateral: Secondary | ICD-10-CM | POA: Diagnosis not present

## 2021-01-30 ENCOUNTER — Other Ambulatory Visit: Payer: Self-pay | Admitting: Internal Medicine

## 2021-02-24 DIAGNOSIS — Z8739 Personal history of other diseases of the musculoskeletal system and connective tissue: Secondary | ICD-10-CM | POA: Diagnosis not present

## 2021-02-24 DIAGNOSIS — E1122 Type 2 diabetes mellitus with diabetic chronic kidney disease: Secondary | ICD-10-CM | POA: Diagnosis not present

## 2021-02-24 DIAGNOSIS — I129 Hypertensive chronic kidney disease with stage 1 through stage 4 chronic kidney disease, or unspecified chronic kidney disease: Secondary | ICD-10-CM | POA: Diagnosis not present

## 2021-02-24 DIAGNOSIS — N189 Chronic kidney disease, unspecified: Secondary | ICD-10-CM | POA: Diagnosis not present

## 2021-02-24 DIAGNOSIS — N183 Chronic kidney disease, stage 3 unspecified: Secondary | ICD-10-CM | POA: Diagnosis not present

## 2021-02-24 DIAGNOSIS — N2581 Secondary hyperparathyroidism of renal origin: Secondary | ICD-10-CM | POA: Diagnosis not present

## 2021-02-24 DIAGNOSIS — E785 Hyperlipidemia, unspecified: Secondary | ICD-10-CM | POA: Diagnosis not present

## 2021-04-30 ENCOUNTER — Encounter: Payer: Self-pay | Admitting: Internal Medicine

## 2021-04-30 DIAGNOSIS — Z1231 Encounter for screening mammogram for malignant neoplasm of breast: Secondary | ICD-10-CM | POA: Diagnosis not present

## 2021-04-30 LAB — HM MAMMOGRAPHY

## 2021-05-01 ENCOUNTER — Encounter: Payer: Self-pay | Admitting: Internal Medicine

## 2021-05-06 ENCOUNTER — Encounter: Payer: Self-pay | Admitting: Internal Medicine

## 2021-05-06 ENCOUNTER — Other Ambulatory Visit: Payer: Self-pay

## 2021-05-06 ENCOUNTER — Other Ambulatory Visit: Payer: Self-pay | Admitting: Internal Medicine

## 2021-05-06 ENCOUNTER — Ambulatory Visit (INDEPENDENT_AMBULATORY_CARE_PROVIDER_SITE_OTHER): Payer: Medicare HMO | Admitting: Internal Medicine

## 2021-05-06 DIAGNOSIS — E538 Deficiency of other specified B group vitamins: Secondary | ICD-10-CM

## 2021-05-06 DIAGNOSIS — I129 Hypertensive chronic kidney disease with stage 1 through stage 4 chronic kidney disease, or unspecified chronic kidney disease: Secondary | ICD-10-CM | POA: Diagnosis not present

## 2021-05-06 DIAGNOSIS — N1831 Chronic kidney disease, stage 3a: Secondary | ICD-10-CM | POA: Diagnosis not present

## 2021-05-06 DIAGNOSIS — M1000 Idiopathic gout, unspecified site: Secondary | ICD-10-CM | POA: Diagnosis not present

## 2021-05-06 DIAGNOSIS — E1122 Type 2 diabetes mellitus with diabetic chronic kidney disease: Secondary | ICD-10-CM

## 2021-05-06 DIAGNOSIS — I119 Hypertensive heart disease without heart failure: Secondary | ICD-10-CM | POA: Diagnosis not present

## 2021-05-06 LAB — LIPID PANEL
Cholesterol: 147 mg/dL (ref 0–200)
HDL: 28.7 mg/dL — ABNORMAL LOW (ref >39.00)
LDL Cholesterol: 95 mg/dL (ref 0–99)
NonHDL: 117.91
Total CHOL/HDL Ratio: 5
Triglycerides: 116 mg/dL (ref 0.0–149.0)
VLDL: 23.2 mg/dL (ref 0.0–40.0)

## 2021-05-06 LAB — BASIC METABOLIC PANEL
BUN: 33 mg/dL — ABNORMAL HIGH (ref 6–23)
CO2: 25 mEq/L (ref 19–32)
Calcium: 9.5 mg/dL (ref 8.4–10.5)
Chloride: 106 mEq/L (ref 96–112)
Creatinine, Ser: 2.01 mg/dL — ABNORMAL HIGH (ref 0.40–1.20)
GFR: 23.27 mL/min — ABNORMAL LOW (ref >60.00)
Glucose, Bld: 102 mg/dL — ABNORMAL HIGH (ref 70–99)
Potassium: 4.5 mEq/L (ref 3.5–5.1)
Sodium: 140 mEq/L (ref 135–145)

## 2021-05-06 LAB — HEMOGLOBIN A1C: Hgb A1c MFr Bld: 5.7 % (ref 4.6–6.5)

## 2021-05-06 LAB — TSH: TSH: 3.27 u[IU]/mL (ref 0.35–4.50)

## 2021-05-06 NOTE — Assessment & Plan Note (Signed)
Cont w/Allopurinol

## 2021-05-06 NOTE — Assessment & Plan Note (Signed)
Cont w/Hydralazine, Furosemide, Labetalol, Losartan  BP Readings from Last 3 Encounters:  05/06/21 (!) 142/52  10/30/20 (!) 142/58  06/14/20 (!) 118/60   Cont w/diet and wt loss

## 2021-05-06 NOTE — Progress Notes (Signed)
Subjective:  Patient ID: MERIAH SHANDS, female    DOB: May 17, 1941  Age: 80 y.o. MRN: [id number]  CC: Follow-up (6 month f/u)   HPI YALENA COLON presents for CRF, HTN, LBP, obesity   Outpatient Medications Prior to Visit  Medication Sig Dispense Refill   acetaminophen (TYLENOL) 325 MG tablet Take 2 tablets (650 mg total) by mouth every 6 (six) hours as needed for moderate pain. 30 tablet 0   acetaminophen-codeine (TYLENOL #3) 300-30 MG tablet Take 1 tablet by mouth every 8 (eight) hours as needed for moderate pain or severe pain. 30 tablet 1   allopurinol (ZYLOPRIM) 100 MG tablet TAKE 1/2 TABLET BY MOUTH EVERY DAY 45 tablet 11   atorvastatin (LIPITOR) 40 MG tablet TAKE 1 TABLET BY MOUTH EVERY DAY 90 tablet 3   Cholecalciferol (VITAMIN D3) 2000 UNITS capsule Take 1 capsule (2,000 Units total) by mouth daily. 100 capsule 3   Coenzyme Q10 (COQ10) 100 MG CAPS Take by mouth.     diclofenac sodium (VOLTAREN) 1 % GEL Apply 4 g topically 4 (four) times daily. 1 Tube 3   Fish Oil-Cholecalciferol (FISH OIL + D3) 1200-1000 MG-UNIT CAPS Take by mouth.     furosemide (LASIX) 80 MG tablet TAKE 1 TABLET BY MOUTH EVERY DAY 90 tablet 3   hydrALAZINE (APRESOLINE) 25 MG tablet TAKE 1 TABLET BY MOUTH THREE TIMES A DAY 270 tablet 1   hydrALAZINE (APRESOLINE) 50 MG tablet Take 50 mg by mouth 3 (three) times daily.     KLOR-CON M20 20 MEQ tablet TAKE 1 TABLET BY MOUTH EVERY DAY 90 tablet 3   labetalol (NORMODYNE) 200 MG tablet Take 200 mg by mouth 2 (two) times daily.     losartan (COZAAR) 100 MG tablet TAKE 1 TABLET BY MOUTH EVERYDAY AT BEDTIME     Multiple Vitamins-Minerals (CENTRUM SILVER PO) Take 1 tablet by mouth daily.      nitroGLYCERIN (NITROSTAT) 0.4 MG SL tablet Place 0.4 mg under the tongue every 5 (five) minutes as needed for chest pain. Reported on 05/28/2016     pantoprazole (PROTONIX) 40 MG tablet TAKE 1 TABLET BY MOUTH EVERY DAY 90 tablet 3   Polyethyl Glycol-Propyl Glycol (SYSTANE FREE OP) Place 1  drop into both eyes daily as needed.     tiZANidine (ZANAFLEX) 4 MG tablet Take 1 tablet (4 mg total) by mouth every 8 (eight) hours as needed for muscle spasms. 30 tablet 0   vitamin B-12 (CYANOCOBALAMIN) 1000 MCG tablet Take 500 mcg by mouth every other day.      vitamin C (ASCORBIC ACID) 500 MG tablet Take 500 mg by mouth daily as needed (for cold).      No facility-administered medications prior to visit.    ROS: Review of Systems  Constitutional:  Negative for activity change, appetite change, chills, fatigue and unexpected weight change.  HENT:  Negative for congestion, mouth sores and sinus pressure.   Eyes:  Negative for visual disturbance.  Respiratory:  Negative for cough and chest tightness.   Gastrointestinal:  Negative for abdominal pain and nausea.  Genitourinary:  Negative for difficulty urinating, frequency and vaginal pain.  Musculoskeletal:  Positive for back pain. Negative for gait problem.  Skin:  Negative for pallor and rash.  Neurological:  Negative for dizziness, tremors, weakness, numbness and headaches.  Psychiatric/Behavioral:  Negative for confusion, sleep disturbance and suicidal ideas.    Objective:  BP (!) 142/52 (BP Location: Left Arm)   Pulse 72  Temp 98.1 F (36.7 C) (Oral)   Ht 5\' 3"  (1.6 m)   Wt 211 lb 0.6 oz (95.7 kg)   SpO2 94%   BMI 37.38 kg/m   BP Readings from Last 3 Encounters:  05/06/21 (!) 142/52  10/30/20 (!) 142/58  06/14/20 (!) 118/60    Wt Readings from Last 3 Encounters:  05/06/21 211 lb 0.6 oz (95.7 kg)  10/30/20 221 lb 12.8 oz (100.6 kg)  06/14/20 (!) 228 lb 9.6 oz (103.7 kg)    Physical Exam Constitutional:      General: She is not in acute distress.    Appearance: She is well-developed. She is obese.  HENT:     Head: Normocephalic.     Right Ear: External ear normal.     Left Ear: External ear normal.     Nose: Nose normal.  Eyes:     General:        Right eye: No discharge.        Left eye: No discharge.      Conjunctiva/sclera: Conjunctivae normal.     Pupils: Pupils are equal, round, and reactive to light.  Neck:     Thyroid: No thyromegaly.     Vascular: No JVD.     Trachea: No tracheal deviation.  Cardiovascular:     Rate and Rhythm: Normal rate and regular rhythm.     Heart sounds: Normal heart sounds.  Pulmonary:     Effort: No respiratory distress.     Breath sounds: No stridor. No wheezing.  Abdominal:     General: Bowel sounds are normal. There is no distension.     Palpations: Abdomen is soft. There is no mass.     Tenderness: There is no abdominal tenderness. There is no guarding or rebound.  Musculoskeletal:        General: No tenderness.     Cervical back: Normal range of motion and neck supple. No rigidity.  Lymphadenopathy:     Cervical: No cervical adenopathy.  Skin:    Findings: No erythema or rash.  Neurological:     Cranial Nerves: No cranial nerve deficit.     Motor: No abnormal muscle tone.     Coordination: Coordination normal.     Deep Tendon Reflexes: Reflexes normal.  Psychiatric:        Behavior: Behavior normal.        Thought Content: Thought content normal.        Judgment: Judgment normal.    Lab Results  Component Value Date   WBC 7.0 10/30/2020   HGB 11.9 (L) 10/30/2020   HCT 36.7 10/30/2020   PLT 206.0 10/30/2020   GLUCOSE 110 (H) 10/30/2020   CHOL 137 10/30/2020   TRIG 112.0 10/30/2020   HDL 28.00 (L) 10/30/2020   LDLDIRECT 127.9 07/14/2011   LDLCALC 86 10/30/2020   ALT 16 10/30/2020   AST 18 10/30/2020   NA 137 10/30/2020   K 4.3 10/30/2020   CL 104 10/30/2020   CREATININE 1.86 (H) 10/30/2020   BUN 26 (H) 10/30/2020   CO2 25 10/30/2020   TSH 3.09 10/30/2020   INR 0.98 06/07/2014   HGBA1C 5.7 10/30/2020   MICROALBUR 1.9 04/29/2020    VAS US CAROTID  Result Date: 05/18/2020 Carotid Arterial Duplex Study Indications:       Carotid artery disease and Patient denies any cerebrovascular                    symptoms. Risk Factors:  Hypertension, hyperlipidemia, Diabetes, no history of                    smoking. Limitations        Today's exam was limited due to the body habitus of the                    patient, the patient's respiratory variation and the high                    bifurcation of the carotid. Comparison Study:  In 07/2016, a carotid duplex showed a RICA velocity of 91/24                    cm/s and a LICA velocity of 161/09 cm/s. Performing Technologist: Wilkie Aye RVT  Examination Guidelines: A complete evaluation includes B-mode imaging, spectral Doppler, color Doppler, and power Doppler as needed of all accessible portions of each vessel. Bilateral testing is considered an integral part of a complete examination. Limited examinations for reoccurring indications may be performed as noted.  Right Carotid Findings: +----------+--------+--------+--------+------------------+--------+           PSV cm/sEDV cm/sStenosisPlaque DescriptionComments +----------+--------+--------+--------+------------------+--------+ CCA Prox  102     14                                         +----------+--------+--------+--------+------------------+--------+ CCA Distal103     14                                         +----------+--------+--------+--------+------------------+--------+ ICA Prox  145     31      1-39%   heterogenous               +----------+--------+--------+--------+------------------+--------+ ICA Mid   121     27                                         +----------+--------+--------+--------+------------------+--------+ ICA Distal120     35                                         +----------+--------+--------+--------+------------------+--------+ ECA       125     5                                          +----------+--------+--------+--------+------------------+--------+ +----------+--------+-------+----------------+-------------------+           PSV cm/sEDV cmsDescribe         Arm Pressure (mmHG) +----------+--------+-------+----------------+-------------------+ Subclavian172            Multiphasic, [id number]                 +----------+--------+-------+----------------+-------------------+ +---------+--------+--+--------+--+---------+ VertebralPSV cm/s51EDV cm/s13Antegrade +---------+--------+--+--------+--+---------+  Left Carotid Findings: +----------+--------+--------+--------+------------------+--------+           PSV cm/sEDV cm/sStenosisPlaque DescriptionComments +----------+--------+--------+--------+------------------+--------+ CCA Prox  126     32                                         +----------+--------+--------+--------+------------------+--------+  CCA Mid   122     21      <50%    heterogenous               +----------+--------+--------+--------+------------------+--------+ CCA Distal122     24                                         +----------+--------+--------+--------+------------------+--------+ ICA Prox  183     32              heterogenous               +----------+--------+--------+--------+------------------+--------+ ICA Mid   171     41      40-59%                             +----------+--------+--------+--------+------------------+--------+ ICA Distal130     31                                         +----------+--------+--------+--------+------------------+--------+ ECA       291     2       >50%    heterogenous               +----------+--------+--------+--------+------------------+--------+ +----------+--------+--------+----------------+-------------------+           PSV cm/sEDV cm/sDescribe        Arm Pressure (mmHG) +----------+--------+--------+----------------+-------------------+ [id number]             Multiphasic, [id number]                 +----------+--------+--------+----------------+-------------------+ +---------+--------+--+--------+-+---------+ VertebralPSV  cm/s49EDV cm/s8Antegrade +---------+--------+--+--------+-+---------+   Summary: Right Carotid: Velocities in the right ICA are consistent with a 1-39% stenosis. Left Carotid: Velocities in the left ICA are consistent with a 40-59% stenosis.               Non-hemodynamically significant plaque <50% noted in the CCA. The               ECA appears >50% stenosed. Vertebrals:  Bilateral vertebral arteries demonstrate antegrade flow. Subclavians: Normal flow hemodynamics were seen in bilateral subclavian              arteries. *See table(s) above for measurements and observations. Suggest follow up study in 12 months. Electronically signed by Larae Grooms MD on 05/18/2020 at 8:18:58 AM.    Final     Assessment & Plan:    Follow-up: No follow-ups on file.  Walker Kehr, MD

## 2021-05-06 NOTE — Assessment & Plan Note (Signed)
Off Rx On diet

## 2021-05-06 NOTE — Assessment & Plan Note (Addendum)
BP Readings from Last 3 Encounters:  05/06/21 (!) 142/52  10/30/20 (!) 142/58  06/14/20 (!) 118/60  Cont w/Hydralazine, Furosemide, Labetalol, Losartan

## 2021-05-06 NOTE — Assessment & Plan Note (Signed)
Cont w/B12 

## 2021-06-04 ENCOUNTER — Telehealth: Payer: Self-pay | Admitting: Internal Medicine

## 2021-06-04 DIAGNOSIS — M79676 Pain in unspecified toe(s): Secondary | ICD-10-CM

## 2021-06-04 NOTE — Telephone Encounter (Signed)
    Patient requesting referral to podiatry to have toe nails cut

## 2021-06-05 NOTE — Telephone Encounter (Signed)
Notified pt of PCP response.  Upon investigation in chart, [facility] lvm to schedule appt.  Pt notified of that as well.  Number given.

## 2021-06-05 NOTE — Telephone Encounter (Signed)
Ok Thx 

## 2021-06-10 ENCOUNTER — Encounter: Payer: Self-pay | Admitting: Podiatry

## 2021-06-10 ENCOUNTER — Other Ambulatory Visit: Payer: Self-pay

## 2021-06-10 ENCOUNTER — Ambulatory Visit: Payer: Medicare HMO | Admitting: Podiatry

## 2021-06-10 VITALS — BP 169/65 | Temp 98.1°F

## 2021-06-10 DIAGNOSIS — M79674 Pain in right toe(s): Secondary | ICD-10-CM | POA: Diagnosis not present

## 2021-06-10 DIAGNOSIS — M2012 Hallux valgus (acquired), left foot: Secondary | ICD-10-CM | POA: Diagnosis not present

## 2021-06-10 DIAGNOSIS — N1831 Chronic kidney disease, stage 3a: Secondary | ICD-10-CM

## 2021-06-10 DIAGNOSIS — E119 Type 2 diabetes mellitus without complications: Secondary | ICD-10-CM

## 2021-06-10 DIAGNOSIS — M79675 Pain in left toe(s): Secondary | ICD-10-CM | POA: Diagnosis not present

## 2021-06-10 DIAGNOSIS — B351 Tinea unguium: Secondary | ICD-10-CM

## 2021-06-10 DIAGNOSIS — E1122 Type 2 diabetes mellitus with diabetic chronic kidney disease: Secondary | ICD-10-CM

## 2021-06-10 DIAGNOSIS — M2011 Hallux valgus (acquired), right foot: Secondary | ICD-10-CM | POA: Diagnosis not present

## 2021-06-10 DIAGNOSIS — B353 Tinea pedis: Secondary | ICD-10-CM

## 2021-06-10 DIAGNOSIS — M2042 Other hammer toe(s) (acquired), left foot: Secondary | ICD-10-CM

## 2021-06-10 DIAGNOSIS — M2041 Other hammer toe(s) (acquired), right foot: Secondary | ICD-10-CM | POA: Diagnosis not present

## 2021-06-10 DIAGNOSIS — I10 Essential (primary) hypertension: Secondary | ICD-10-CM | POA: Insufficient documentation

## 2021-06-10 NOTE — Patient Instructions (Addendum)
Purchase Lotrimin Spray Powder and spray between toes and under toes once daily.  Diabetes Mellitus and Foot Care Foot care is an important part of your health, especially when you have diabetes. Diabetes may cause you to have problems because of poor blood flow (circulation) to your feet and legs, which can cause your skin to: Become thinner and drier. Break more easily. Heal more slowly. Peel and crack. You may also have nerve damage (neuropathy) in your legs and feet, causing decreased feeling in them. This means that you may not notice minor injuries to your feet that could lead to more serious problems. Noticing and addressing any potential problems early is the best wayto prevent future foot problems. How to care for your feet Foot hygiene  Wash your feet daily with warm water and mild soap. Do not use hot water. Then, pat your feet and the areas between your toes until they are completely dry. Do not soak your feet as this can dry your skin. Trim your toenails straight across. Do not dig under them or around the cuticle. File the edges of your nails with an emery board or nail file. Apply a moisturizing lotion or petroleum jelly to the skin on your feet and to dry, brittle toenails. Use lotion that does not contain alcohol and is unscented. Do not apply lotion between your toes.  Shoes and socks Wear clean socks or stockings every day. Make sure they are not too tight. Do not wear knee-high stockings since they may decrease blood flow to your legs. Wear shoes that fit properly and have enough cushioning. Always look in your shoes before you put them on to be sure there are no objects inside. To break in new shoes, wear them for just a few hours a day. This prevents injuries on your feet. Wounds, scrapes, corns, and calluses  Check your feet daily for blisters, cuts, bruises, sores, and redness. If you cannot see the bottom of your feet, use a mirror or ask someone for help. Do not cut  corns or calluses or try to remove them with medicine. If you find a minor scrape, cut, or break in the skin on your feet, keep it and the skin around it clean and dry. You may clean these areas with mild soap and water. Do not clean the area with peroxide, alcohol, or iodine. If you have a wound, scrape, corn, or callus on your foot, look at it several times a day to make sure it is healing and not infected. Check for: Redness, swelling, or pain. Fluid or blood. Warmth. Pus or a bad smell.  General tips Do not cross your legs. This may decrease blood flow to your feet. Do not use heating pads or hot water bottles on your feet. They may burn your skin. If you have lost feeling in your feet or legs, you may not know this is happening until it is too late. Protect your feet from hot and cold by wearing shoes, such as at the beach or on hot pavement. Schedule a complete foot exam at least once a year (annually) or more often if you have foot problems. Report any cuts, sores, or bruises to your health care provider immediately. Where to find more information American Diabetes Association: www.diabetes.org Association of Diabetes Care & Education Specialists: www.diabeteseducator.org Contact a health care provider if: You have a medical condition that increases your risk of infection and you have any cuts, sores, or bruises on your feet. You have  an injury that is not healing. You have redness on your legs or feet. You feel burning or tingling in your legs or feet. You have pain or cramps in your legs and feet. Your legs or feet are numb. Your feet always feel cold. You have pain around any toenails. Get help right away if: You have a wound, scrape, corn, or callus on your foot and: You have pain, swelling, or redness that gets worse. You have fluid or blood coming from the wound, scrape, corn, or callus. Your wound, scrape, corn, or callus feels warm to the touch. You have pus or a bad  smell coming from the wound, scrape, corn, or callus. You have a fever. You have a red line going up your leg. Summary Check your feet every day for blisters, cuts, bruises, sores, and redness. Apply a moisturizing lotion or petroleum jelly to the skin on your feet and to dry, brittle toenails. Wear shoes that fit properly and have enough cushioning. If you have foot problems, report any cuts, sores, or bruises to your health care provider immediately. Schedule a complete foot exam at least once a year (annually) or more often if you have foot problems. This information is not intended to replace advice given to you by your health care provider. Make sure you discuss any questions you have with your healthcare provider. Document Revised: 05/30/2020 Document Reviewed: 05/30/2020 Elsevier Patient Education  [place] A bunion (hallux valgus) is a bump that forms slowly on the inner side of the big toe joint. It occurs when the big toe turns toward the second toe. Bunions may be small at first,but they often get larger over time. They can make walking painful. What are the causes? This condition may be caused by: Wearing narrow or pointed shoes that force the big toe to press against the other toes. Abnormal foot development that causes the foot to roll inward. Changes in the foot that are caused by certain diseases, such as rheumatoid arthritis or polio. A foot injury. What increases the risk? The following factors may make you more likely to develop this condition: Wearing shoes that squeeze the toes together. Having certain diseases, such as: Rheumatoid arthritis. Polio. Cerebral palsy. Having family members who have bunions. Being born with abnormally shaped feet (a foot deformity), such as flat feet or low arches. Doing activities that put a lot of pressure on the feet, such as ballet dancing. What are the signs or symptoms?  The main symptom of this condition is  a bump on your big toe that you cannotice. Other symptoms may include: Pain. Redness and inflammation around your big toe. Thick or hardened skin on your big toe or between your toes. Stiffness or loss of motion in your big toe. Trouble with walking. How is this diagnosed? This condition may be diagnosed based on your symptoms, medical history, and activities. You may also have tests and imaging, such as: X-rays. These allow your health care provider to check the position of the bones in your foot and look for damage to your joint. They also help your health care provider determine the severity of your bunion and the best way to treat it. Joint aspiration. In this test, a sample of fluid is removed from the toe joint. This test may be done if you are in a lot of pain. It helps rule out diseases that cause painful swelling of the joints, such as arthritis or gout. How is this treated?  Treatment depends on the severity of your symptoms. The goal of treatment is to relieve symptoms and prevent your bunion from getting worse. Your health care provider may recommend: Wearing shoes that have a wide toe box, or using bunion pads to cushion the affected area. Taping your toes together to keep them in a normal position. Placing a device inside your shoe (orthotic device) to help reduce pressure on your toe joint. Taking medicine to ease pain and inflammation. Putting ice or heat on the affected area. Doing stretching exercises. Surgery, for severe cases. Follow these instructions at home: Managing pain, stiffness, and swelling     If directed, put ice on the painful area. To do this: Put ice in a plastic bag. Place a towel between your skin and the bag. Leave the ice on for 20 minutes, 2-3 times a day. Remove the ice if your skin turns bright red. This is very important. If you cannot feel pain, heat, or cold, you have a greater risk of damage to the area. If directed, apply heat to the affected  area before you exercise. Use the heat source that your health care provider recommends, such as a moist heat pack or a heating pad. Place a towel between your skin and the heat source. Leave the heat on for 20-30 minutes. Remove the heat if your skin turns bright red. This is especially important if you are unable to feel pain, heat, or cold. You have a greater risk of getting burned. General instructions Do exercises as told by your health care provider. Support your toe joint with proper footwear, shoe padding, or taping as told by your health care provider. Take over-the-counter and prescription medicines only as told by your health care provider. Do not use any products that contain nicotine or tobacco, such as cigarettes, e-cigarettes, and chewing tobacco. If you need help quitting, ask your health care provider. Keep all follow-up visits. This is important. Contact a health care provider if: Your symptoms get worse. Your symptoms do not improve in 2 weeks. Get help right away if: You have severe pain and trouble with walking. Summary A bunion is a bump on the inner side of the big toe joint that forms when the big toe turns toward the second toe. Bunions can make walking painful. Treatment depends on the severity of your symptoms. Support your toe joint with proper footwear, shoe padding, or taping as told by your health care provider. This information is not intended to replace advice given to you by your health care provider. Make sure you discuss any questions you have with your healthcare provider. Document Revised: 03/15/2020 Document Reviewed: 03/15/2020 Elsevier Patient Education  [place].  Onychomycosis/Fungal Toenails  WHAT IS IT? An infection that lies within the keratin of your nail plate that is caused by a fungus.  WHY ME? Fungal infections affect all ages, sexes, races, and creeds.  There may be many factors that predispose you to a fungal infection such as  age, coexisting medical conditions such as diabetes, or an autoimmune disease; stress, medications, fatigue, genetics, etc.  Bottom line: fungus thrives in a warm, moist environment and your shoes offer such a location.  IS IT CONTAGIOUS? Theoretically, yes.  You do not want to share shoes, nail clippers or files with someone who has fungal toenails.  Walking around barefoot in the same room or sleeping in the same bed is unlikely to transfer the organism.  It is important to realize, however, that fungus  can spread easily from one nail to the next on the same foot.  HOW DO WE TREAT THIS?  There are several ways to treat this condition.  Treatment may depend on many factors such as age, medications, pregnancy, liver and kidney conditions, etc.  It is best to ask your doctor which options are available to you.  No treatment.   Unlike many other medical concerns, you can live with this condition.  However for many people this can be a painful condition and may lead to ingrown toenails or a bacterial infection.  It is recommended that you keep the nails cut short to help reduce the amount of fungal nail. Topical treatment.  These range from herbal remedies to prescription strength nail lacquers.  About 40-50% effective, topicals require twice daily application for approximately 9 to 12 months or until an entirely new nail has grown out.  The most effective topicals are medical grade medications available through physicians offices. Oral antifungal medications.  With an 80-90% cure rate, the most common oral medication requires 3 to 4 months of therapy and stays in your system for a year as the new nail grows out.  Oral antifungal medications do require blood work to make sure it is a safe drug for you.  A liver function panel will be performed prior to starting the medication and after the first month of treatment.  It is important to have the blood work performed to avoid any harmful side effects.  In general,  this medication safe but blood work is required. Laser Therapy.  This treatment is performed by applying a specialized laser to the affected nail plate.  This therapy is noninvasive, fast, and non-painful.  It is not covered by insurance and is therefore, out of pocket.  The results have been very good with a 80-95% cure rate.  [facility] is the only practice in the area to offer this therapy. Permanent Nail Avulsion.  Removing the entire nail so that a new nail will not grow back.

## 2021-06-13 NOTE — Progress Notes (Signed)
Subjective: Bridget Oliver presents today referred by her good friends. She relates she is diabetic.   Patient relates 7 year history of diabetes.  Patient denies history of foot wounds.  Patient denies any symptoms of numbness in feet.  Patient denies symptoms of tingling in feet.  Patient denies any symptoms of burning in feet.  Patient denies any symptoms of pins/needles sensations in feet.  Patient does not monitory blood sugar daily.  PCP is [name], [name], MD , and last visit was 05/06/2021.  Today, patient c/o of painful, discolored, thick toenails which interfere with daily activities.  Pain is aggravated when wearing enclosed shoe gear.   Patient states she also has itching and peeling skin under her left 5th toe lately. She has been keeping it clean and dry. Has not attempted any treatment. Denies any breaks in skin or blistering.  Past Medical History:  Diagnosis Date   CRI (chronic renal insufficiency)    Stage 3   Diabetes mellitus 2011   Type II diet controlled   Diarrhea    GERD (gastroesophageal reflux disease)    Gout    Hyperglycemia    Hyperlipidemia    Hypertension    Lactose intolerance    Macular degeneration    Obesity    Swelling of lower limb     Patient Active Problem List   Diagnosis Date Noted   Hypertension 06/10/2021   Mixed hyperlipidemia 08/08/2018   Cough 06/27/2018   GERD (gastroesophageal reflux disease) 06/24/2017   LBP radiating to right leg 05/15/2016   Well adult exam 12/19/2015   History of colonic polyps 11/26/2014   Right tennis elbow 11/21/2013   Right forearm pain 11/13/2013   Wrist pain 06/06/2013   DM (diabetes mellitus), type 2 with renal complications ([place]) 79/39/0300   Hand pain, right 05/07/2011   Bursitis of elbow 05/07/2011   Atypical chest pain 03/30/2011   Abdominal pain, other specified site 03/30/2011   Hypertensive cardiomegaly 01/12/2011   Carotid artery stenosis 03/17/2010   Gout 03/10/2010    Disorder of kidney and ureter 07/20/2008   Dyslipidemia 03/21/2008   OBESITY, MORBID 03/21/2008   B12 deficiency 09/21/2007   Hypertensive renal disease 09/21/2007   Edema 09/21/2007    Past Surgical History:  Procedure Laterality Date   [place]   CATARACT EXTRACTION  2010 & 2011   bilateral   CESAREAN SECTION  1969   LUMBAR LAMINECTOMY  2007   PARS PLANA VITRECTOMY W/ REPAIR OF MACULAR HOLE  2010 & 2011   Bilateral    Current Outpatient Medications on File Prior to Visit  Medication Sig Dispense Refill   acetaminophen (TYLENOL) 325 MG tablet Take 2 tablets (650 mg total) by mouth every 6 (six) hours as needed for moderate pain. 30 tablet 0   acetaminophen-codeine (TYLENOL #3) 300-30 MG tablet Take 1 tablet by mouth every 8 (eight) hours as needed for moderate pain or severe pain. 30 tablet 1   allopurinol (ZYLOPRIM) 100 MG tablet TAKE 1/2 TABLET BY MOUTH EVERY DAY 45 tablet 11   atorvastatin (LIPITOR) 40 MG tablet TAKE 1 TABLET BY MOUTH EVERY DAY 90 tablet 3   Cholecalciferol (VITAMIN D3) 2000 UNITS capsule Take 1 capsule (2,000 Units total) by mouth daily. 100 capsule 3   Coenzyme Q10 (COQ10) 100 MG CAPS Take by mouth.     diclofenac sodium (VOLTAREN) 1 % GEL Apply 4 g topically 4 (four) times daily. 1 Tube 3  Fish Oil-Cholecalciferol (FISH OIL + D3) 1200-1000 MG-UNIT CAPS Take by mouth.     furosemide (LASIX) 80 MG tablet TAKE 1 TABLET BY MOUTH EVERY DAY 90 tablet 3   hydrALAZINE (APRESOLINE) 25 MG tablet TAKE 1 TABLET BY MOUTH THREE TIMES A DAY 270 tablet 1   hydrALAZINE (APRESOLINE) 50 MG tablet Take 50 mg by mouth 3 (three) times daily.     labetalol (NORMODYNE) 200 MG tablet Take 200 mg by mouth 2 (two) times daily.     losartan (COZAAR) 100 MG tablet TAKE 1 TABLET BY MOUTH EVERYDAY AT BEDTIME     Multiple Vitamins-Minerals (CENTRUM SILVER PO) Take 1 tablet by mouth daily.      nitroGLYCERIN (NITROSTAT) 0.4 MG SL tablet Place 0.4  mg under the tongue every 5 (five) minutes as needed for chest pain. Reported on 05/28/2016     pantoprazole (PROTONIX) 40 MG tablet TAKE 1 TABLET BY MOUTH EVERY DAY 90 tablet 3   PFIZER-BIONT COVID-19 VAC-TRIS SUSP injection      Polyethyl Glycol-Propyl Glycol (SYSTANE FREE OP) Place 1 drop into both eyes daily as needed. (Patient not taking: Reported on 06/10/2021)     potassium chloride SA (KLOR-CON M20) 20 MEQ tablet TAKE 1 TABLET BY MOUTH EVERY DAY 90 tablet 1   tiZANidine (ZANAFLEX) 4 MG tablet Take 1 tablet (4 mg total) by mouth every 8 (eight) hours as needed for muscle spasms. 30 tablet 0   vitamin B-12 (CYANOCOBALAMIN) 1000 MCG tablet Take 500 mcg by mouth every other day.      vitamin C (ASCORBIC ACID) 500 MG tablet Take 500 mg by mouth daily as needed (for cold).      No current facility-administered medications on file prior to visit.     Allergies  Allergen Reactions   Oxycodone     Nausea Can take codein    Social History   Occupational History   Not on file  Tobacco Use   Smoking status: Never   Smokeless tobacco: Never  Substance and Sexual Activity   Alcohol use: Yes    Alcohol/week: 0.0 standard drinks    Comment: rarely   Drug use: No   Sexual activity: Not on file    Family History  Problem Relation Age of Onset   Hypertension Mother    Hypertension Father    Cancer Other        Lung   Colon cancer Neg Hx    Esophageal cancer Neg Hx    Stomach cancer Neg Hx    Rectal cancer Neg Hx     Immunization History  Administered Date(s) Administered   Fluad Quad(high Dose 65+) 08/11/2019, 08/19/2020   Influenza Whole 10/07/2010, 07/25/2011, 08/26/2012   Influenza, High Dose Seasonal PF 09/14/2013, 09/11/2017, 09/13/2018   Influenza,inj,Quad PF,6+ Mos 07/25/2014, 08/16/2015   PFIZER(Purple Top)SARS-COV-2 Vaccination 12/12/2019, 01/02/2020, 08/27/2020   Pneumococcal Conjugate-13 11/26/2014   Pneumococcal Polysaccharide-23 11/10/2011   Tdap 11/10/2011    Zoster Recombinat (Shingrix) 09/08/2018, 01/10/2019   Zoster, Live 02/28/2008    Objective: Vitals:   06/10/21 1019  BP: (!) 169/65  Temp: 98.1 F (36.7 C)  SpO2: 98%    Bridget Oliver is a pleasant 80 y.o. female in NAD. AAO X 3.  Vascular Examination: Capillary refill time to digits immediate b/l. Palpable pedal pulses b/l LE. Pedal hair sparse. Lower extremity skin temperature gradient within normal limits. No pain with calf compression b/l. No edema noted b/l lower extremities.  Dermatological Examination: Pedal skin with normal  turgor, texture and tone b/l lower extremities. No open wounds b/l lower extremities. Toenails 2-5 bilaterally and L hallux elongated, discolored, dystrophic, thickened, and crumbly with subungual debris and tenderness to dorsal palpation. Anonychia noted R hallux. Nailbed(s) epithelialized.  She has peeling skin plantar aspect of left 5th digit. No breaks in skin. No signs of secondary bacterial infection, no weeping, no blistering.  Musculoskeletal Examination: Normal muscle strength 5/5 to all lower extremity muscle groups bilaterally. No pain crepitus or joint limitation noted with ROM b/l. Hallux valgus with bunion deformity noted b/l lower extremities. Hammertoe(s) noted to the L 2nd toe.  Footwear Assessment: Does the patient wear appropriate shoes? Yes. Does the patient need inserts/orthotics? No.  Neurological Examination: Protective sensation intact 5/5 intact bilaterally with 10g monofilament b/l. Vibratory sensation intact b/l. Clonus negative b/l.  Lab: Hemoglobin A1C Latest Ref Rng & Units 05/06/2021 10/30/2020  HGBA1C 4.6 - 6.5 % 5.7 5.7  Some recent data might be hidden   Assessment: 1. Pain due to onychomycosis of toenails of both feet   2. Tinea pedis of left foot   3. Type 2 diabetes mellitus with stage 3a chronic kidney disease, without long-term current use of insulin (HCC)   4. Hallux valgus, acquired, bilateral   5. Acquired  hammertoes of both feet   6. Encounter for diabetic foot exam ([place])   ADA Risk Categorization: Low Risk:  Patient has all of the following: Intact protective sensation No prior foot ulcer  No severe deformity Pedal pulses present  Plan: -Examined patient. -Diabetic foot examination performed today. -Discussed diabetic foot care principles. Literature dispensed on today. -Patient to continue soft, supportive shoe gear daily. -Toenails 2-5 bilaterally and L hallux debrided in length and girth without iatrogenic bleeding with sterile nail nipper and dremel.  -Patient to report any pedal injuries to medical professional immediately. -For tinea pedis, she was instructed to purchase Lotrimin Spray Powder between toes once daily. Call office if condition does not improve. -Patient/POA to call should there be question/concern in the interim.  Return in about 3 months (around 09/10/2021).  Marzetta Board, DPM

## 2021-06-24 DIAGNOSIS — E1122 Type 2 diabetes mellitus with diabetic chronic kidney disease: Secondary | ICD-10-CM | POA: Diagnosis not present

## 2021-06-24 DIAGNOSIS — Z8739 Personal history of other diseases of the musculoskeletal system and connective tissue: Secondary | ICD-10-CM | POA: Diagnosis not present

## 2021-06-24 DIAGNOSIS — I129 Hypertensive chronic kidney disease with stage 1 through stage 4 chronic kidney disease, or unspecified chronic kidney disease: Secondary | ICD-10-CM | POA: Diagnosis not present

## 2021-06-24 DIAGNOSIS — N183 Chronic kidney disease, stage 3 unspecified: Secondary | ICD-10-CM | POA: Diagnosis not present

## 2021-06-24 DIAGNOSIS — N189 Chronic kidney disease, unspecified: Secondary | ICD-10-CM | POA: Diagnosis not present

## 2021-06-24 DIAGNOSIS — N2581 Secondary hyperparathyroidism of renal origin: Secondary | ICD-10-CM | POA: Diagnosis not present

## 2021-06-24 DIAGNOSIS — E785 Hyperlipidemia, unspecified: Secondary | ICD-10-CM | POA: Diagnosis not present

## 2021-08-01 ENCOUNTER — Other Ambulatory Visit: Payer: Self-pay | Admitting: Internal Medicine

## 2021-08-25 ENCOUNTER — Other Ambulatory Visit: Payer: Self-pay | Admitting: Internal Medicine

## 2021-09-12 ENCOUNTER — Ambulatory Visit: Payer: Medicare HMO | Admitting: Podiatry

## 2021-09-12 ENCOUNTER — Encounter: Payer: Self-pay | Admitting: Podiatry

## 2021-09-12 ENCOUNTER — Other Ambulatory Visit: Payer: Self-pay

## 2021-09-12 DIAGNOSIS — N1831 Chronic kidney disease, stage 3a: Secondary | ICD-10-CM

## 2021-09-12 DIAGNOSIS — E1122 Type 2 diabetes mellitus with diabetic chronic kidney disease: Secondary | ICD-10-CM

## 2021-09-12 DIAGNOSIS — M79674 Pain in right toe(s): Secondary | ICD-10-CM | POA: Diagnosis not present

## 2021-09-12 DIAGNOSIS — M79675 Pain in left toe(s): Secondary | ICD-10-CM

## 2021-09-12 DIAGNOSIS — B351 Tinea unguium: Secondary | ICD-10-CM | POA: Diagnosis not present

## 2021-09-12 DIAGNOSIS — M109 Gout, unspecified: Secondary | ICD-10-CM | POA: Insufficient documentation

## 2021-09-19 NOTE — Progress Notes (Signed)
Subjective: Bridget Oliver is a 80 y.o. female patient seen today for follow up of  painful thick toenails that are difficult to trim. Pain interferes with ambulation. Aggravating factors include wearing enclosed shoe gear. Pain is relieved with periodic professional debridement.  New problems reported today: None.  Patient is diabetic. Patient did not check blood glucose on today.  PCP is Bridget Oliver, Bridget Lacks, MD. Last visit was: 05/06/2021.  Allergies  Allergen Reactions   Oxycodone     Nausea Can take codein    Objective: Physical Exam  General: Patient is a pleasant 80 y.o. African American female in NAD. AAO x 3.   Neurovascular Examination: Capillary refill time to digits immediate b/l. Palpable DP pulse(s) b/l lower extremities Palpable PT pulse(s) b/l lower extremities Pedal hair sparse. Lower extremity skin temperature gradient within normal limits. No pain with calf compression b/l. No edema noted b/l LE.  Protective sensation intact 5/5 intact bilaterally with 10g monofilament b/l.  Dermatological:  Pedal skin is warm and supple b/l LE. Toenails 2-5 bilaterally and L hallux elongated, discolored, dystrophic, thickened, and crumbly with subungual debris and tenderness to dorsal palpation. Anonychia noted R hallux. Nailbed(s) epithelialized.   Musculoskeletal:  Normal muscle strength 5/5 to all lower extremity muscle groups bilaterally. HAV with bunion deformity noted b/l LE. Hammertoe(s) noted to the L 2nd toe.  Assessment: 1. Pain due to onychomycosis of toenails of both feet   2. Type 2 diabetes mellitus with stage 3a chronic kidney disease, without long-term current use of insulin ([place])    Plan: Patient was evaluated and treated and all questions answered. Consent given for treatment as described below: -No new findings. No new orders. -Continue diabetic foot care principles: inspect feet daily, monitor glucose as recommended by PCP and/or Endocrinologist, and follow  prescribed diet per PCP, Endocrinologist and/or dietician. -Toenails 2-5 bilaterally and L hallux debrided in length and girth without iatrogenic bleeding with sterile nail nipper and dremel.  -Patient/POA to call should there be question/concern in the interim.  Return in about 3 months (around 12/13/2021).  Marzetta Board, DPM

## 2021-09-23 ENCOUNTER — Telehealth: Payer: Self-pay | Admitting: Internal Medicine

## 2021-09-23 NOTE — Telephone Encounter (Signed)
Left message for patient to call me back at [phone] to schedule Medicare Annual Wellness Visit   Last AWV  06/14/20  Please schedule at anytime with [facility] if patient calls the office back.    40 Minutes appointment   Any questions, please call me at [phone]

## 2021-09-29 ENCOUNTER — Other Ambulatory Visit: Payer: Self-pay

## 2021-09-29 ENCOUNTER — Emergency Department (HOSPITAL_COMMUNITY): Payer: Medicare HMO

## 2021-09-29 ENCOUNTER — Ambulatory Visit
Admission: EM | Admit: 2021-09-29 | Discharge: 2021-09-29 | Disposition: A | Discharge status: Home / Self Care | Payer: Medicare HMO | Attending: Physician Assistant | Admitting: Physician Assistant

## 2021-09-29 ENCOUNTER — Observation Stay (HOSPITAL_COMMUNITY)
Admission: EM | Admit: 2021-09-29 | Discharge: 2021-10-02 | Disposition: A | Discharge status: Home / Self Care | Payer: Medicare HMO | Attending: Internal Medicine | Admitting: Internal Medicine

## 2021-09-29 DIAGNOSIS — I160 Hypertensive urgency: Secondary | ICD-10-CM | POA: Diagnosis present

## 2021-09-29 DIAGNOSIS — I129 Hypertensive chronic kidney disease with stage 1 through stage 4 chronic kidney disease, or unspecified chronic kidney disease: Secondary | ICD-10-CM | POA: Insufficient documentation

## 2021-09-29 DIAGNOSIS — Z79899 Other long term (current) drug therapy: Secondary | ICD-10-CM | POA: Insufficient documentation

## 2021-09-29 DIAGNOSIS — E785 Hyperlipidemia, unspecified: Secondary | ICD-10-CM | POA: Diagnosis present

## 2021-09-29 DIAGNOSIS — N183 Chronic kidney disease, stage 3 unspecified: Secondary | ICD-10-CM | POA: Insufficient documentation

## 2021-09-29 DIAGNOSIS — R079 Chest pain, unspecified: Secondary | ICD-10-CM

## 2021-09-29 DIAGNOSIS — N184 Chronic kidney disease, stage 4 (severe): Secondary | ICD-10-CM | POA: Diagnosis present

## 2021-09-29 DIAGNOSIS — E1122 Type 2 diabetes mellitus with diabetic chronic kidney disease: Secondary | ICD-10-CM | POA: Insufficient documentation

## 2021-09-29 DIAGNOSIS — R0789 Other chest pain: Secondary | ICD-10-CM | POA: Diagnosis not present

## 2021-09-29 DIAGNOSIS — Z20822 Contact with and (suspected) exposure to covid-19: Secondary | ICD-10-CM | POA: Insufficient documentation

## 2021-09-29 DIAGNOSIS — I10 Essential (primary) hypertension: Secondary | ICD-10-CM | POA: Diagnosis not present

## 2021-09-29 DIAGNOSIS — H40023 Open angle with borderline findings, high risk, bilateral: Secondary | ICD-10-CM | POA: Diagnosis not present

## 2021-09-29 DIAGNOSIS — R072 Precordial pain: Principal | ICD-10-CM | POA: Insufficient documentation

## 2021-09-29 LAB — CBC
HCT: 37.7 % (ref 36.0–46.0)
Hemoglobin: 12.1 g/dL (ref 12.0–15.0)
MCH: 27.3 pg (ref 26.0–34.0)
MCHC: 32.1 g/dL (ref 30.0–36.0)
MCV: 85.1 fL (ref 80.0–100.0)
Platelets: 220 10*3/uL (ref 150–400)
RBC: 4.43 MIL/uL (ref 3.87–5.11)
RDW: 13.8 % (ref 11.5–15.5)
WBC: 12 10*3/uL — ABNORMAL HIGH (ref 4.0–10.5)
nRBC: 0 % (ref 0.0–0.2)

## 2021-09-29 LAB — BASIC METABOLIC PANEL
Anion gap: 9 (ref 5–15)
BUN: 20 mg/dL (ref 8–23)
CO2: 21 mmol/L — ABNORMAL LOW (ref 22–32)
Calcium: 9.3 mg/dL (ref 8.9–10.3)
Chloride: 107 mmol/L (ref 98–111)
Creatinine, Ser: 1.41 mg/dL — ABNORMAL HIGH (ref 0.44–1.00)
GFR, Estimated: 38 mL/min — ABNORMAL LOW (ref >60)
Glucose, Bld: 96 mg/dL (ref 70–99)
Potassium: 4.2 mmol/L (ref 3.5–5.1)
Sodium: 137 mmol/L (ref 135–145)

## 2021-09-29 LAB — TROPONIN I (HIGH SENSITIVITY)
Troponin I (High Sensitivity): 6 ng/L (ref <18)
Troponin I (High Sensitivity): 6 ng/L (ref <18)

## 2021-09-29 NOTE — ED Triage Notes (Signed)
Patient arrived from [facility] for chest pain and received ntg for same with some relief. Patient alert and oriented, NAD

## 2021-09-29 NOTE — ED Provider Notes (Signed)
[facility]    CSN: [id number] Arrival date & time: 09/29/21  1502      History   Chief Complaint Chief Complaint  Patient presents with   Chest Pain    HPI Bridget Oliver is a 80 y.o. female.   Patient here today for evaluation of left-sided chest pain that started a few hours ago.  She reports that she initially noticed pain in her left shoulder that then radiated to her chest.  She reports that she has taken Tylenol as well as a regular strength aspirin and she has had some improvement since that time.  She denies any shortness of breath.  She is not had any nausea or vomiting.  She does not have history of heart attack but does have past medical history significant for diabetes and hyperlipidemia as well as hypertension.  Of note blood pressure is also somewhat elevated compared to prior measurements.  The history is provided by the patient.  Chest Pain Associated symptoms: no abdominal pain, no fever, no nausea, no shortness of breath and no vomiting    Past Medical History:  Diagnosis Date   CRI (chronic renal insufficiency)    Stage 3   Diabetes mellitus 2011   Type II diet controlled   Diarrhea    GERD (gastroesophageal reflux disease)    Gout    Hyperglycemia    Hyperlipidemia    Hypertension    Lactose intolerance    Macular degeneration    Obesity    Swelling of lower limb     Patient Active Problem List   Diagnosis Date Noted   Gouty arthritis 09/12/2021   Hypertension 06/10/2021   Mixed hyperlipidemia 08/08/2018   Cough 06/27/2018   GERD (gastroesophageal reflux disease) 06/24/2017   LBP radiating to right leg 05/15/2016   Well adult exam 12/19/2015   History of colonic polyps 11/26/2014   Right tennis elbow 11/21/2013   Right forearm pain 11/13/2013   Wrist pain 06/06/2013   DM (diabetes mellitus), type 2 with renal complications ([place]) 01/74/9449   Hand pain, right 05/07/2011   Bursitis of elbow 05/07/2011   Atypical chest pain  03/30/2011   Abdominal pain, other specified site 03/30/2011   Hypertensive cardiomegaly 01/12/2011   Carotid artery stenosis 03/17/2010   Gout 03/10/2010   Disorder of kidney and ureter 07/20/2008   Dyslipidemia 03/21/2008   OBESITY, MORBID 03/21/2008   B12 deficiency 09/21/2007   Hypertensive renal disease 09/21/2007   Edema 09/21/2007    Past Surgical History:  Procedure Laterality Date   [place]   CATARACT EXTRACTION  2010 & 2011   bilateral   CESAREAN SECTION  1969   LUMBAR LAMINECTOMY  2007   PARS PLANA VITRECTOMY W/ REPAIR OF MACULAR HOLE  2010 & 2011   Bilateral    OB History   No obstetric history on file.      Home Medications    Prior to Admission medications   Medication Sig Start Date End Date Taking? Authorizing Provider  acetaminophen (TYLENOL) 325 MG tablet Take 2 tablets (650 mg total) by mouth every 6 (six) hours as needed for moderate pain. 06/08/14   Oswald Hillock, MD  acetaminophen-codeine (TYLENOL #3) 300-30 MG tablet Take 1 tablet by mouth every 8 (eight) hours as needed for moderate pain or severe pain. 05/15/16   [name], [name], MD  allopurinol (ZYLOPRIM) 100 MG tablet TAKE 1/2 TABLET BY MOUTH EVERY DAY 08/26/21  [name], [name], MD  amoxicillin (AMOXIL) 500 MG capsule Take by mouth. 07/11/21   [provider]  atorvastatin (LIPITOR) 40 MG tablet TAKE 1 TABLET BY MOUTH EVERY DAY 08/01/21   [name], [name], MD  Cholecalciferol (VITAMIN D3) 2000 UNITS capsule Take 1 capsule (2,000 Units total) by mouth daily. 08/16/15   [name], [name], MD  clindamycin (CLEOCIN) 300 MG capsule Take 300 mg by mouth every 12 (twelve) hours. 07/08/21   [provider]  Coenzyme Q10 (COQ10) 100 MG CAPS Take by mouth.    [provider]  diclofenac sodium (VOLTAREN) 1 % GEL Apply 4 g topically 4 (four) times daily. 05/28/16   Burnard Hawthorne, FNP  Fish Oil-Cholecalciferol (FISH OIL + D3)  1200-1000 MG-UNIT CAPS Take by mouth.    [provider]  furosemide (LASIX) 80 MG tablet TAKE 1 TABLET BY MOUTH EVERY DAY 12/03/20   [name], [name], MD  hydrALAZINE (APRESOLINE) 25 MG tablet TAKE 1 TABLET BY MOUTH THREE TIMES A DAY 11/27/19   [name], [name], MD  hydrALAZINE (APRESOLINE) 50 MG tablet Take 50 mg by mouth 3 (three) times daily. 09/13/19   [provider]  KLOR-CON M20 20 MEQ tablet TAKE 1 TABLET BY MOUTH EVERY DAY 08/26/21   [name], [name], MD  labetalol (NORMODYNE) 200 MG tablet Take 200 mg by mouth 2 (two) times daily. 09/27/18   [provider]  losartan (COZAAR) 100 MG tablet TAKE 1 TABLET BY MOUTH EVERYDAY AT BEDTIME 11/18/18   [provider]  Multiple Vitamins-Minerals (CENTRUM SILVER PO) Take 1 tablet by mouth daily.     [provider]  nitroGLYCERIN (NITROSTAT) 0.4 MG SL tablet Place 0.4 mg under the tongue every 5 (five) minutes as needed for chest pain. Reported on 05/28/2016    [provider]  pantoprazole (PROTONIX) 40 MG tablet TAKE 1 TABLET BY MOUTH EVERY DAY 01/31/21   [name], [name], MD  PFIZER-BIONT COVID-19 VAC-TRIS SUSP injection  03/26/21   [provider]  Polyethyl Glycol-Propyl Glycol (SYSTANE FREE OP) Place 1 drop into both eyes daily as needed.    [provider]  tiZANidine (ZANAFLEX) 4 MG tablet Take 1 tablet (4 mg total) by mouth every 8 (eight) hours as needed for muscle spasms. 05/15/16   [name], [name], MD  vitamin B-12 (CYANOCOBALAMIN) 1000 MCG tablet Take 500 mcg by mouth every other day.     [provider]  vitamin C (ASCORBIC ACID) 500 MG tablet Take 500 mg by mouth daily as needed (for cold).     [provider]    Family History Family History  Problem Relation Age of Onset   Hypertension Mother    Hypertension Father    Cancer Other        Lung   Colon cancer Neg Hx    Esophageal cancer Neg Hx    Stomach cancer Neg Hx     Rectal cancer Neg Hx     Social History Social History   Tobacco Use   Smoking status: Never   Smokeless tobacco: Never  Substance Use Topics   Alcohol use: Yes    Alcohol/week: 0.0 standard drinks    Comment: rarely   Drug use: No     Allergies   Oxycodone   Review of Systems Review of Systems  Constitutional:  Negative for chills and fever.  Eyes:  Negative for discharge and redness.  Respiratory:  Negative for shortness of breath.  Cardiovascular:  Positive for chest pain.  Gastrointestinal:  Negative for abdominal pain, nausea and vomiting.  Genitourinary:  Positive for vaginal bleeding and vaginal discharge.    Physical Exam Triage Vital Signs ED Triage Vitals  Enc Vitals Group     BP 09/29/21 1518 (!) 183/66     Pulse Rate 09/29/21 1518 75     Resp 09/29/21 1518 18     Temp 09/29/21 1518 97.7 F (36.5 C)     Temp Source 09/29/21 1518 Oral     SpO2 09/29/21 1518 95 %     Weight --      Height --      Head Circumference --      Peak Flow --      Pain Score 09/29/21 1519 7     Pain Loc --      Pain Edu? --      Excl. in [place]? --    No data found.  Updated Vital Signs BP (!) 183/66 (BP Location: Left Arm)   Pulse 75   Temp 97.7 F (36.5 C) (Oral)   Resp 18   SpO2 95%   Physical Exam Vitals and nursing note reviewed.  Constitutional:      General: She is not in acute distress.    Appearance: Normal appearance. She is not ill-appearing.  HENT:     Head: Normocephalic and atraumatic.  Eyes:     Conjunctiva/sclera: Conjunctivae normal.  Cardiovascular:     Rate and Rhythm: Normal rate and regular rhythm.     Heart sounds: Normal heart sounds. No murmur heard. Pulmonary:     Effort: Pulmonary effort is normal. No respiratory distress.     Breath sounds: Normal breath sounds. No wheezing, rhonchi or rales.  Neurological:     Mental Status: She is alert.  Psychiatric:        Mood and Affect: Mood normal.        Behavior: Behavior normal.         Thought Content: Thought content normal.     UC Treatments / Results  Labs (all labs ordered are listed, but only abnormal results are displayed) Labs Reviewed - No data to display  EKG   Radiology No results found.  Procedures Procedures (including critical care time)  Medications Ordered in UC Medications - No data to display  Initial Impression / Assessment and Plan / UC Course  I have reviewed the triage vital signs and the nursing notes.  Pertinent labs & imaging results that were available during my care of the patient were reviewed by me and considered in my medical decision making (see chart for details).  Patient appears very stable in office however there is slight EKG changes in lead V3.  Given complaints, past medical history, and EKG changes recommended further evaluation in the emergency department and EMS was called for transport.  Final Clinical Impressions(s) / UC Diagnoses   Final diagnoses:  Chest pain, unspecified type   Discharge Instructions   None    ED Prescriptions   None    PDMP not reviewed this encounter.   Francene Finders, PA-C 09/29/21 [phone]

## 2021-09-29 NOTE — ED Triage Notes (Signed)
Pt c/o lt sided chest pain radiating to lt shoulder since noon. Denies SOB or diaphoresis.

## 2021-09-29 NOTE — ED Notes (Signed)
Patient is being discharged from the Urgent Care and sent to the Emergency Department via EMS . Per Doyle Askew, [place], patient is in need of higher level of care due to EKG changes. Patient is aware and verbalizes understanding of plan of care.  Vitals:   09/29/21 1518  BP: (!) 183/66  Pulse: 75  Resp: 18  Temp: 97.7 F (36.5 C)  SpO2: 95%

## 2021-09-30 ENCOUNTER — Encounter (HOSPITAL_COMMUNITY): Payer: Self-pay | Admitting: Family Medicine

## 2021-09-30 ENCOUNTER — Observation Stay (HOSPITAL_BASED_OUTPATIENT_CLINIC_OR_DEPARTMENT_OTHER): Payer: Medicare HMO

## 2021-09-30 DIAGNOSIS — N183 Chronic kidney disease, stage 3 unspecified: Secondary | ICD-10-CM | POA: Diagnosis present

## 2021-09-30 DIAGNOSIS — N1831 Chronic kidney disease, stage 3a: Secondary | ICD-10-CM

## 2021-09-30 DIAGNOSIS — R079 Chest pain, unspecified: Secondary | ICD-10-CM

## 2021-09-30 DIAGNOSIS — I160 Hypertensive urgency: Secondary | ICD-10-CM

## 2021-09-30 DIAGNOSIS — N184 Chronic kidney disease, stage 4 (severe): Secondary | ICD-10-CM | POA: Diagnosis present

## 2021-09-30 LAB — TROPONIN I (HIGH SENSITIVITY)
Troponin I (High Sensitivity): 6 ng/L (ref <18)
Troponin I (High Sensitivity): 8 ng/L (ref <18)

## 2021-09-30 LAB — LIPID PANEL
Cholesterol: 151 mg/dL (ref 0–200)
HDL: 38 mg/dL — ABNORMAL LOW (ref >40)
LDL Cholesterol: 103 mg/dL — ABNORMAL HIGH (ref 0–99)
Total CHOL/HDL Ratio: 4 RATIO
Triglycerides: 50 mg/dL (ref <150)
VLDL: 10 mg/dL (ref 0–40)

## 2021-09-30 LAB — RESP PANEL BY RT-PCR (FLU A&B, COVID) ARPGX2
Influenza A by PCR: NEGATIVE
Influenza B by PCR: NEGATIVE
SARS Coronavirus 2 by RT PCR: NEGATIVE

## 2021-09-30 MED ORDER — ASPIRIN EC 81 MG PO TBEC
81.0000 mg | DELAYED_RELEASE_TABLET | Freq: Every day | ORAL | Status: DC
  Administered 2021-10-01 – 2021-10-02 (×2): 81 mg via ORAL
  Filled 2021-09-30 (×2): qty 1

## 2021-09-30 MED ORDER — ASPIRIN 325 MG PO TABS
325.0000 mg | ORAL_TABLET | Freq: Every day | ORAL | Status: DC
  Filled 2021-09-30: qty 1

## 2021-09-30 MED ORDER — ACETAMINOPHEN 325 MG PO TABS: 650.0000 mg | ORAL_TABLET | ORAL | Status: DC | PRN

## 2021-09-30 MED ORDER — HYDRALAZINE HCL 25 MG PO TABS: 25.0000 mg | ORAL_TABLET | Freq: Three times a day (TID) | ORAL | Status: DC

## 2021-09-30 MED ORDER — HYDRALAZINE HCL 25 MG PO TABS: 50.0000 mg | ORAL_TABLET | Freq: Three times a day (TID) | ORAL | Status: DC

## 2021-09-30 MED ORDER — REGADENOSON 0.4 MG/5ML IV SOLN
INTRAVENOUS | Status: AC
  Administered 2021-10-01: 0.4 mg via INTRAVENOUS
  Filled 2021-09-30: qty 5

## 2021-09-30 MED ORDER — REGADENOSON 0.4 MG/5ML IV SOLN
0.4000 mg | Freq: Once | INTRAVENOUS | Status: AC
  Filled 2021-09-30: qty 5

## 2021-09-30 MED ORDER — LOSARTAN POTASSIUM 50 MG PO TABS
100.0000 mg | ORAL_TABLET | Freq: Every day | ORAL | Status: DC
  Administered 2021-09-30 – 2021-10-02 (×3): 100 mg via ORAL
  Filled 2021-09-30 (×3): qty 2

## 2021-09-30 MED ORDER — CARVEDILOL 25 MG PO TABS
25.0000 mg | ORAL_TABLET | Freq: Two times a day (BID) | ORAL | Status: DC
  Administered 2021-09-30 – 2021-10-02 (×5): 25 mg via ORAL
  Filled 2021-09-30 (×2): qty 1
  Filled 2021-09-30: qty 8
  Filled 2021-09-30 (×2): qty 1

## 2021-09-30 MED ORDER — NITROGLYCERIN 2 % TD OINT
1.0000 [in_us] | TOPICAL_OINTMENT | Freq: Once | TRANSDERMAL | Status: AC
  Administered 2021-09-30: 1 [in_us] via TOPICAL
  Filled 2021-09-30: qty 1

## 2021-09-30 MED ORDER — ONDANSETRON HCL 4 MG/2ML IJ SOLN: 4.0000 mg | Freq: Four times a day (QID) | INTRAMUSCULAR | Status: DC | PRN

## 2021-09-30 MED ORDER — NICARDIPINE HCL IN NACL 20-0.86 MG/200ML-% IV SOLN: 3.0000 mg/h | INTRAVENOUS | Status: DC

## 2021-09-30 MED ORDER — ATORVASTATIN CALCIUM 40 MG PO TABS
40.0000 mg | ORAL_TABLET | Freq: Every day | ORAL | Status: DC
  Administered 2021-10-01 – 2021-10-02 (×2): 40 mg via ORAL
  Filled 2021-09-30 (×2): qty 1

## 2021-09-30 MED ORDER — TECHNETIUM TC 99M TETROFOSMIN IV KIT
11.0000 | PACK | Freq: Once | INTRAVENOUS | Status: AC | PRN
  Administered 2021-09-30: 11 via INTRAVENOUS

## 2021-09-30 MED ORDER — LABETALOL HCL 5 MG/ML IV SOLN
10.0000 mg | Freq: Once | INTRAVENOUS | Status: AC
  Administered 2021-09-30: 10 mg via INTRAVENOUS
  Filled 2021-09-30: qty 4

## 2021-09-30 MED ORDER — ENOXAPARIN SODIUM 40 MG/0.4ML IJ SOSY
40.0000 mg | PREFILLED_SYRINGE | Freq: Every day | INTRAMUSCULAR | Status: DC
  Administered 2021-10-01 – 2021-10-02 (×2): 40 mg via SUBCUTANEOUS
  Filled 2021-09-30 (×2): qty 0.4

## 2021-09-30 MED ORDER — HYDRALAZINE HCL 50 MG PO TABS
75.0000 mg | ORAL_TABLET | Freq: Three times a day (TID) | ORAL | Status: DC
  Administered 2021-09-30 – 2021-10-02 (×6): 75 mg via ORAL
  Filled 2021-09-30 (×6): qty 1

## 2021-09-30 MED ORDER — HYDRALAZINE HCL 20 MG/ML IJ SOLN
10.0000 mg | Freq: Once | INTRAMUSCULAR | Status: AC
  Administered 2021-09-30: 10 mg via INTRAVENOUS
  Filled 2021-09-30: qty 1

## 2021-09-30 NOTE — Progress Notes (Signed)
This patient is a 80 yr old woman who presented to [facility] ED on 09/29/2021 with complaints of chest pain. In the ED her EKG demonstrated NSR with flattened T's laterally. Earlier EKG performed during admission demonstrated inverted T's laterally. However, these changes are also present on an EKG from 07/2018. Troponins were negative.  The patient carries a medical history significant for CKD, DM II, Hypertension and hyperlipidemia. For this reason Cardiology was consulted and a NM stress was ordered.   The patient was admitted this morning by my colleague, Dr. Tonie Griffith.  She states that she is compliant with her BP meds, but that she does not check her BP at home. It is unknown how long it has been out of control.  Since her admission, despite administration of Losartan 100 mg daily, Coreg 25 mg bid, and labetalol, the patient's blood pressure has continued to range between 182/63 and 227/76. For this reason only the resting portion of the stress was able to be performed today. She was returned to [facility].   When I saw the patient she was resting comfortably. She stated that she still had CP, but that it was much better. She was awake, alert, and oriented x 3. No acute distress. Family was at bedside. Heart and lung exam were within normal limites. Abdomen soft, non-tender, non-distended. Bowel sounds are normoactive. Extremities are negative for cyanosis, clubbing, or edema.  I have discussed the patient with ICU. She will be transferred there and will be started on a nicardipine drip. Goal SBP will be 170-180 initially.

## 2021-09-30 NOTE — Progress Notes (Signed)
At 1817, Patient's BP 144/45. In speaking to ICU RN, questioned if patient was ICU appropriate. Spoke to MD about whether she was still ICU appropriate. MD stated that since her SBP was 144, she should stay on 3E.  1900: Patient BP 183/48. Patient in bed resting.

## 2021-09-30 NOTE — H&P (Signed)
History and Physical    Bridget Oliver:[id number] DOB: 1941-03-08 DOA: 09/29/2021  PCP: Cassandria Anger, MD   Patient coming from: Home  Chief Complaint: Chest pain  HPI: Bridget Oliver is a 80 y.o. female with medical history significant for HTN, HLD, CKD, DMT2 diet controlled, obesity who presents for evaluation of chest pain.  She reports that yesterday morning around 11 AM she began to experience substernal chest pressure and pain.  She reports that it is a tightness that would radiate into her left shoulder and into her left upper arm at times.  It is associated with shortness of breath but no nausea vomiting or jaw pain.  She took aspirin and Tylenol with only minimal improvement.  She states at its worst it was a 7 out of 10 on the pain scale.  EMS gave her nitroglycerin sublingual tablets which eased the chest pain.  When she arrived in the emergency room she had nitroglycerin paste placed on her which did ease off the chest pain.  She states she was just sitting when the chest pain began was not doing any type of activity.  She reports her last stress test was 9 to 10 years ago.  She does not remember anything making the chest pain worse or helping it at home.  Denies tobacco alcohol or illicit drug use.  ED Course: In the emergency room Ms. Kontos has elevated blood pressure readings in the 180-205/65-70 range.  She is now chest pain-free.  She had an EKG that did not show a acute ST elevation or depression.  Troponins in the emergency room were 6 on 2 occasions.  She was given hydralazine and labetalol IV for her blood pressure.  Lab work reveals a WBC of 12,000 hemoglobin 12.1 hematocrit 37.7 platelets 220,000 sodium 137 potassium 4.2 chloride 107 bicarb 21 creatinine 1.41 BUN 20 calcium 9.3 glucose 96.  COVID swab is being obtained and is pending.  Hospitalist service asked to evaluate and manage patient for further work-up  Review of Systems:  General: Denies fever, chills, weight  loss, night sweats.  Denies dizziness.  Denies change in appetite HENT: Denies head trauma, headache, denies change in hearing, tinnitus.  Denies nasal congestion or bleeding.  Denies sore throat.  Denies difficulty swallowing Eyes: Denies blurry vision, pain in eye, drainage.  Denies discoloration of eyes. Neck: Denies pain.  Denies swelling.  Denies pain with movement. Cardiovascular: Reports chest pain. Denies palpitations.  Denies edema.  Denies orthopnea Respiratory: Reports shortness of breath.  Denies wheezing.  Denies sputum production Gastrointestinal: Denies abdominal pain, swelling.  Denies nausea, vomiting, diarrhea.  Denies melena.  Denies hematemesis. Musculoskeletal: Denies limitation of movement.  Denies deformity or swelling. Denies arthralgias or myalgias. Genitourinary: Denies pelvic pain.  Denies urinary frequency or hesitancy.  Denies dysuria.  Skin: Denies rash.  Denies petechiae, purpura, ecchymosis. Neurological: Denies syncope.  Denies seizure activity. Denies slurred speech, drooping face. Denies visual change. Psychiatric: Denies depression, anxiety. Denies hallucinations.  Past Medical History:  Diagnosis Date   CRI (chronic renal insufficiency)    Stage 3   Diabetes mellitus 2011   Type II diet controlled   Diarrhea    GERD (gastroesophageal reflux disease)    Gout    Hyperglycemia    Hyperlipidemia    Hypertension    Lactose intolerance    Macular degeneration    Obesity    Swelling of lower limb     Past Surgical History:  Procedure Laterality Date  [place]   CATARACT EXTRACTION  2010 & 2011   bilateral   CESAREAN SECTION  1969   LUMBAR LAMINECTOMY  2007   PARS PLANA VITRECTOMY W/ REPAIR OF MACULAR HOLE  2010 & 2011   Bilateral    Social History  reports that she has never smoked. She has never used smokeless tobacco. She reports current alcohol use. She reports that she does not use  drugs.  Allergies  Allergen Reactions   Oxycodone     Nausea Can take codein    Family History  Problem Relation Age of Onset   Hypertension Mother    Hypertension Father    Cancer Other        Lung   Colon cancer Neg Hx    Esophageal cancer Neg Hx    Stomach cancer Neg Hx    Rectal cancer Neg Hx      Prior to Admission medications   Medication Sig Start Date End Date Taking? Authorizing Provider  acetaminophen (TYLENOL) 325 MG tablet Take 2 tablets (650 mg total) by mouth every 6 (six) hours as needed for moderate pain. 06/08/14   Oswald Hillock, MD  acetaminophen-codeine (TYLENOL #3) 300-30 MG tablet Take 1 tablet by mouth every 8 (eight) hours as needed for moderate pain or severe pain. 05/15/16   [name], [name], MD  allopurinol (ZYLOPRIM) 100 MG tablet TAKE 1/2 TABLET BY MOUTH EVERY DAY 08/26/21   [name], [name], MD  amoxicillin (AMOXIL) 500 MG capsule Take by mouth. 07/11/21   [provider]  atorvastatin (LIPITOR) 40 MG tablet TAKE 1 TABLET BY MOUTH EVERY DAY 08/01/21   [name], [name], MD  Cholecalciferol (VITAMIN D3) 2000 UNITS capsule Take 1 capsule (2,000 Units total) by mouth daily. 08/16/15   [name], [name], MD  clindamycin (CLEOCIN) 300 MG capsule Take 300 mg by mouth every 12 (twelve) hours. 07/08/21   [provider]  Coenzyme Q10 (COQ10) 100 MG CAPS Take by mouth.    [provider]  diclofenac sodium (VOLTAREN) 1 % GEL Apply 4 g topically 4 (four) times daily. 05/28/16   Burnard Hawthorne, FNP  Fish Oil-Cholecalciferol (FISH OIL + D3) 1200-1000 MG-UNIT CAPS Take by mouth.    [provider]  furosemide (LASIX) 80 MG tablet TAKE 1 TABLET BY MOUTH EVERY DAY 12/03/20   [name], [name], MD  hydrALAZINE (APRESOLINE) 25 MG tablet TAKE 1 TABLET BY MOUTH THREE TIMES A DAY 11/27/19   [name], [name], MD  hydrALAZINE (APRESOLINE) 50 MG tablet Take 50 mg by mouth 3 (three) times daily. 09/13/19   [provider]  KLOR-CON M20 20 MEQ tablet TAKE 1 TABLET BY MOUTH EVERY DAY 08/26/21   [name], [name], MD  labetalol (NORMODYNE) 200 MG tablet Take 200 mg by mouth 2 (two) times daily. 09/27/18   [provider]  losartan (COZAAR) 100 MG tablet TAKE 1 TABLET BY MOUTH EVERYDAY AT BEDTIME 11/18/18   [provider]  Multiple Vitamins-Minerals (CENTRUM SILVER PO) Take 1 tablet by mouth daily.     [provider]  nitroGLYCERIN (NITROSTAT) 0.4 MG SL tablet Place 0.4 mg under the tongue every 5 (five) minutes as needed for chest pain. Reported on 05/28/2016    [provider]  pantoprazole (PROTONIX) 40 MG tablet TAKE 1 TABLET BY MOUTH EVERY DAY 01/31/21   [name], [name], MD  PFIZER-BIONT COVID-19 VAC-TRIS SUSP injection  03/26/21   [provider]  Polyethyl Glycol-Propyl Glycol (SYSTANE FREE OP) Place 1 drop into both eyes daily as needed.    [provider]  tiZANidine (ZANAFLEX) 4 MG tablet Take 1 tablet (4 mg total) by mouth every 8 (eight) hours as needed for muscle spasms. 05/15/16   [name], [name], MD  vitamin B-12 (CYANOCOBALAMIN) 1000 MCG tablet Take 500 mcg by mouth every other day.     [provider]  vitamin C (ASCORBIC ACID) 500 MG tablet Take 500 mg by mouth daily as needed (for cold).     [provider]    Physical Exam: Vitals:   09/30/21 0430 09/30/21 0500 09/30/21 0523 09/30/21 0530  BP: (!) 203/69 (!) 207/63 (!) 200/68 (!) 204/63  Pulse: 77 79 82 80  Resp: (!) 23 19 (!) 21 (!) 28  Temp:      SpO2: 99% 98% 97% 98%    Constitutional: NAD, calm, comfortable Vitals:   09/30/21 0430 09/30/21 0500 09/30/21 0523 09/30/21 0530  BP: (!) 203/69 (!) 207/63 (!) 200/68 (!) 204/63  Pulse: 77 79 82 80  Resp: (!) 23 19 (!) 21 (!) 28  Temp:      SpO2: 99% 98% 97% 98%   General: WDWN, Alert and oriented x3.  Eyes: EOMI, PERRL, conjunctivae normal.  Sclera nonicteric HENT:  [place]/AT, external ears normal.  Nares  patent without epistasis.  Mucous membranes are moist. Posterior pharynx clear Neck: Soft, normal range of motion, supple, no masses, Trachea midline Respiratory: clear to auscultation bilaterally, no wheezing, no crackles. Normal respiratory effort. No accessory muscle use.  Cardiovascular: Regular rate and rhythm, no murmurs / rubs / gallops. No extremity edema. 2+ pedal pulses. Abdomen: Soft, no tenderness, nondistended, no rebound or guarding.  No masses palpated. Obese. Bowel sounds normoactive Musculoskeletal: FROM. no  cyanosis. No joint deformity upper and lower extremities. Normal muscle tone.  Skin: Warm, dry, intact no rashes, lesions, ulcers. No induration Neurologic: CN 2-12 grossly intact.  Normal speech.  Sensation intact to touch. Strength 5/5 in all extremities.   Psychiatric: Normal judgment and insight.  Normal mood.    Labs on Admission: I have personally reviewed following labs and imaging studies  CBC: Recent Labs  Lab 09/29/21 1737  WBC 12.0*  HGB 12.1  HCT 37.7  MCV 85.1  PLT 341    Basic Metabolic Panel: Recent Labs  Lab 09/29/21 1737  NA 137  K 4.2  CL 107  CO2 21*  GLUCOSE 96  BUN 20  CREATININE 1.41*  CALCIUM 9.3    GFR: CrCl cannot be calculated (Unknown ideal weight.).  Liver Function Tests: No results for input(s): AST, ALT, ALKPHOS, BILITOT, PROT, ALBUMIN in the last 168 hours.  Urine analysis:    Component Value Date/Time   COLORURINE YELLOW 10/30/2020 1117   APPEARANCEUR CLEAR 10/30/2020 1117   LABSPEC 1.015 10/30/2020 1117   PHURINE 5.5 10/30/2020 1117   [place] 10/30/2020 1117   [place] 10/30/2020 1117   [place] 10/30/2020 1117   KETONESUR NEGATIVE 10/30/2020 1117   UROBILINOGEN 0.2 10/30/2020 1117   NITRITE NEGATIVE 10/30/2020 1117   LEUKOCYTESUR TRACE (A) 10/30/2020 1117    Radiological Exams on Admission: DG Chest 2 View  Result Date: 09/29/2021 CLINICAL DATA:  Chest pain EXAM: CHEST -  2 VIEW COMPARISON:  Chest x-ray 06/27/2018, CT chest 06/08/2014 FINDINGS: The heart and mediastinal contours are unchanged. Aortic calcification. No focal consolidation. No pulmonary edema. No pleural effusion. No pneumothorax. No acute osseous  abnormality. IMPRESSION: No active cardiopulmonary disease. Electronically Signed   By: Iven Finn M.D.   On: 09/29/2021 18:28    EKG: Independently reviewed.  EKG shows normal sinus rhythm with lateral T wave inversions that are old on previous EKGs.  Elevation or depression.  QTc 439  Assessment/Plan Principal Problem:   Chest pain Patient replaced on cardiac telemetry for observation for chest pain.  Obtain serial troponin levels.  If troponins remain negative we will proceed with stress test.  If troponins become positive will consult cardiology.  Antiplatelet therapy with aspirin daily.  Check lipid panel.  Continue statin therapy.  Monitor blood pressure.  Nitroglycerin patch in place.  Supplemental oxygen as needed to maintain O2 sat between 92-96%.   Active Problems:   Hypertensive urgency Continue hydralazine and cozaar. Start Coreg in place of labetalol.    CKD (chronic kidney disease) stage 3, GFR 30-59 ml/min  Stable.    Dyslipidemia Continue Lipitor.  Check lipid panel    DVT prophylaxis: Lovenox for DVT prophylaxis Code Status:   Full code Family Communication:  Diagnosis plan discussed with patient and her daughters at the bedside.  Electrolytes understanding the plan.  Further recommendations as clinical indicated Disposition Plan:   Patient is from:  Home  Anticipated DC to:  Home  Anticipated DC date:  Anticipate less than 2 midnight stay   Admission status:  Observation   Yevonne Aline Jazlen Ogarro MD [facility]  How to contact the [facility] Attending or Consulting provider [place] or covering provider during after hours [place], for this patient?   Check the care team in [facility] and look for a) attending/consulting [facility]  provider listed and b) the [facility] team listed Log into [email or web address] and use 's universal password to access. If you do not have the password, please contact the hospital operator. Locate the [facility] provider you are looking for under [facility] and page to a number that you can be directly reached. If you still have difficulty reaching the provider, please page the [facility] (Director on Call) for the Hospitalists listed on amion for assistance.  09/30/2021, 6:24 AM

## 2021-09-30 NOTE — ED Notes (Signed)
This RN walked to Stress test lab & did pt's covid swab & the lab tech there will be bringing pt up to her assigned Ready Bed.

## 2021-09-30 NOTE — ED Provider Notes (Signed)
[facility] EMERGENCY DEPARTMENT Provider Note   CSN: [id number] Arrival date & time: 09/29/21  1630     History Chief Complaint  Patient presents with   Chest Pain    Bridget Oliver is a 80 y.o. female.  HPI     This is a 80 year old female with a history of diabetes, hypertension, hyperlipidemia, obesity who presents with chest pain.  Patient reports onset of chest pain around 11 AM yesterday.  She describes it as tightness and radiating into her left shoulder.  She took some Tylenol and aspirin with some improvement.  No shortness of breath, nausea, vomiting, fever, cough.  Patient noted her blood pressure to be elevated.  She has a history of hypertension and reports compliance with her medications.  Currently she is rating 7 out of 10 pressure-like chest pain.  It is nonexertional and she does not think it is gets better or worse with anything specific.  Patient was initially seen in urgent care.  She received nitroglycerin by EMS and reported improvement of her pain with nitroglycerin.  Past Medical History:  Diagnosis Date   CRI (chronic renal insufficiency)    Stage 3   Diabetes mellitus 2011   Type II diet controlled   Diarrhea    GERD (gastroesophageal reflux disease)    Gout    Hyperglycemia    Hyperlipidemia    Hypertension    Lactose intolerance    Macular degeneration    Obesity    Swelling of lower limb     Patient Active Problem List   Diagnosis Date Noted   Gouty arthritis 09/12/2021   Hypertension 06/10/2021   Mixed hyperlipidemia 08/08/2018   Cough 06/27/2018   GERD (gastroesophageal reflux disease) 06/24/2017   LBP radiating to right leg 05/15/2016   Well adult exam 12/19/2015   History of colonic polyps 11/26/2014   Right tennis elbow 11/21/2013   Right forearm pain 11/13/2013   Wrist pain 06/06/2013   DM (diabetes mellitus), type 2 with renal complications ([place]) 71/24/5809   Hand pain, right 05/07/2011   Bursitis of elbow  05/07/2011   Atypical chest pain 03/30/2011   Abdominal pain, other specified site 03/30/2011   Hypertensive cardiomegaly 01/12/2011   Carotid artery stenosis 03/17/2010   Gout 03/10/2010   Disorder of kidney and ureter 07/20/2008   Dyslipidemia 03/21/2008   OBESITY, MORBID 03/21/2008   B12 deficiency 09/21/2007   Hypertensive renal disease 09/21/2007   Edema 09/21/2007    Past Surgical History:  Procedure Laterality Date   [place]   CATARACT EXTRACTION  2010 & 2011   bilateral   CESAREAN SECTION  1969   LUMBAR LAMINECTOMY  2007   PARS PLANA VITRECTOMY W/ REPAIR OF MACULAR HOLE  2010 & 2011   Bilateral     OB History   No obstetric history on file.     Family History  Problem Relation Age of Onset   Hypertension Mother    Hypertension Father    Cancer Other        Lung   Colon cancer Neg Hx    Esophageal cancer Neg Hx    Stomach cancer Neg Hx    Rectal cancer Neg Hx     Social History   Tobacco Use   Smoking status: Never   Smokeless tobacco: Never  Substance Use Topics   Alcohol use: Yes    Alcohol/week: 0.0 standard drinks    Comment: rarely  Drug use: No    Home Medications Prior to Admission medications   Medication Sig Start Date End Date Taking? Authorizing Provider  acetaminophen (TYLENOL) 325 MG tablet Take 2 tablets (650 mg total) by mouth every 6 (six) hours as needed for moderate pain. 06/08/14   Oswald Hillock, MD  acetaminophen-codeine (TYLENOL #3) 300-30 MG tablet Take 1 tablet by mouth every 8 (eight) hours as needed for moderate pain or severe pain. 05/15/16   [name], [name], MD  allopurinol (ZYLOPRIM) 100 MG tablet TAKE 1/2 TABLET BY MOUTH EVERY DAY 08/26/21   [name], [name], MD  amoxicillin (AMOXIL) 500 MG capsule Take by mouth. 07/11/21   [provider]  atorvastatin (LIPITOR) 40 MG tablet TAKE 1 TABLET BY MOUTH EVERY DAY 08/01/21   [name], [name], MD  Cholecalciferol  (VITAMIN D3) 2000 UNITS capsule Take 1 capsule (2,000 Units total) by mouth daily. 08/16/15   [name], [name], MD  clindamycin (CLEOCIN) 300 MG capsule Take 300 mg by mouth every 12 (twelve) hours. 07/08/21   [provider]  Coenzyme Q10 (COQ10) 100 MG CAPS Take by mouth.    [provider]  diclofenac sodium (VOLTAREN) 1 % GEL Apply 4 g topically 4 (four) times daily. 05/28/16   Burnard Hawthorne, FNP  Fish Oil-Cholecalciferol (FISH OIL + D3) 1200-1000 MG-UNIT CAPS Take by mouth.    [provider]  furosemide (LASIX) 80 MG tablet TAKE 1 TABLET BY MOUTH EVERY DAY 12/03/20   [name], [name], MD  hydrALAZINE (APRESOLINE) 25 MG tablet TAKE 1 TABLET BY MOUTH THREE TIMES A DAY 11/27/19   [name], [name], MD  hydrALAZINE (APRESOLINE) 50 MG tablet Take 50 mg by mouth 3 (three) times daily. 09/13/19   [provider]  KLOR-CON M20 20 MEQ tablet TAKE 1 TABLET BY MOUTH EVERY DAY 08/26/21   [name], [name], MD  labetalol (NORMODYNE) 200 MG tablet Take 200 mg by mouth 2 (two) times daily. 09/27/18   [provider]  losartan (COZAAR) 100 MG tablet TAKE 1 TABLET BY MOUTH EVERYDAY AT BEDTIME 11/18/18   [provider]  Multiple Vitamins-Minerals (CENTRUM SILVER PO) Take 1 tablet by mouth daily.     [provider]  nitroGLYCERIN (NITROSTAT) 0.4 MG SL tablet Place 0.4 mg under the tongue every 5 (five) minutes as needed for chest pain. Reported on 05/28/2016    [provider]  pantoprazole (PROTONIX) 40 MG tablet TAKE 1 TABLET BY MOUTH EVERY DAY 01/31/21   [name], [name], MD  PFIZER-BIONT COVID-19 VAC-TRIS SUSP injection  03/26/21   [provider]  Polyethyl Glycol-Propyl Glycol (SYSTANE FREE OP) Place 1 drop into both eyes daily as needed.    [provider]  tiZANidine (ZANAFLEX) 4 MG tablet Take 1 tablet (4 mg total) by mouth every 8 (eight) hours as needed for muscle spasms. 05/15/16   [name], [name],  MD  vitamin B-12 (CYANOCOBALAMIN) 1000 MCG tablet Take 500 mcg by mouth every other day.     [provider]  vitamin C (ASCORBIC ACID) 500 MG tablet Take 500 mg by mouth daily as needed (for cold).     [provider]    Allergies    Oxycodone  Review of Systems   Review of Systems  Constitutional:  Negative for fever.  Respiratory:  Positive for chest tightness. Negative for cough and shortness of breath.   Cardiovascular:  Positive for chest pain.  Gastrointestinal:  Negative for abdominal pain,  nausea and vomiting.  Genitourinary:  Negative for flank pain.  All other systems reviewed and are negative.  Physical Exam Updated Vital Signs BP (!) 200/68   Pulse 82   Temp 97.9 F (36.6 C)   Resp (!) 21   SpO2 97%   Physical Exam Vitals and nursing note reviewed.  Constitutional:      Appearance: She is well-developed. She is obese. She is not ill-appearing.  HENT:     Head: Normocephalic and atraumatic.  Eyes:     Pupils: Pupils are equal, round, and reactive to light.  Cardiovascular:     Rate and Rhythm: Normal rate and regular rhythm.     Heart sounds: Normal heart sounds.  Pulmonary:     Effort: Pulmonary effort is normal. No respiratory distress.     Breath sounds: No wheezing.  Chest:     Chest wall: No tenderness.  Abdominal:     General: Bowel sounds are normal.     Palpations: Abdomen is soft.  Musculoskeletal:     Cervical back: Neck supple.     Right lower leg: No edema.     Left lower leg: No edema.  Skin:    General: Skin is warm and dry.  Neurological:     Mental Status: She is alert and oriented to person, place, and time.  Psychiatric:        Mood and Affect: Mood normal.    ED Results / Procedures / Treatments   Labs (all labs ordered are listed, but only abnormal results are displayed) Labs Reviewed  BASIC METABOLIC PANEL - Abnormal; Notable for the following components:      Result Value   CO2 21 (*)    Creatinine,  Ser 1.41 (*)    GFR, Estimated 38 (*)    All other components within normal limits  CBC - Abnormal; Notable for the following components:   WBC 12.0 (*)    All other components within normal limits  TROPONIN I (HIGH SENSITIVITY)  TROPONIN I (HIGH SENSITIVITY)    EKG EKG Interpretation  Date/Time:  Monday September 29 2021 17:25:35 EST Ventricular Rate:  74 PR Interval:  200 QRS Duration: 78 QT Interval:  396 QTC Calculation: 439 R Axis:   16 Text Interpretation: Normal sinus rhythm  Lateral T wave inversions (lead I, aVL), similar to July 2015, no significant changes from 2015 ecg Confirmed by Octaviano Glow [phone]) on 09/29/2021 5:33:32 PM  Radiology DG Chest 2 View  Result Date: 09/29/2021 CLINICAL DATA:  Chest pain EXAM: CHEST - 2 VIEW COMPARISON:  Chest x-ray 06/27/2018, CT chest 06/08/2014 FINDINGS: The heart and mediastinal contours are unchanged. Aortic calcification. No focal consolidation. No pulmonary edema. No pleural effusion. No pneumothorax. No acute osseous abnormality. IMPRESSION: No active cardiopulmonary disease. Electronically Signed   By: Iven Finn M.D.   On: 09/29/2021 18:28    Procedures .Critical Care Performed by: Merryl Hacker, MD Authorized by: Merryl Hacker, MD   Critical care provider statement:    Critical care time (minutes):  45   Critical care was necessary to treat or prevent imminent or life-threatening deterioration of the following conditions:  Cardiac failure   Critical care was time spent personally by me on the following activities:  Development of treatment plan with patient or surrogate, ordering and review of laboratory studies, ordering and review of radiographic studies, pulse oximetry, re-evaluation of patient's condition, review of old charts, evaluation of patient's response to treatment and discussions with consultants  Medications Ordered in ED Medications  labetalol (NORMODYNE) injection 10 mg (has no  administration in time range)  aspirin tablet 325 mg (has no administration in time range)  hydrALAZINE (APRESOLINE) injection 10 mg (10 mg Intravenous Given 09/30/21 0434)  nitroGLYCERIN (NITROGLYN) 2 % ointment 1 inch (1 inch Topical Given 09/30/21 0432)    ED Course  I have reviewed the triage vital signs and the nursing notes.  Pertinent labs & imaging results that were available during my care of the patient were reviewed by me and considered in my medical decision making (see chart for details).  Clinical Course as of 09/30/21 0532  Tue Sep 30, 2021  0531 Minimal improvement with blood pressure with IV hydralazine.  Dose of IV labetalol.  Has some ongoing chest discomfort but improved.  Suspect hypertensive urgency.  Last stress test was in 2015 which was normal at that time.  However she is high risk with a heart score 6.  Will admit for further evaluation and blood pressure control. [CH]    Clinical Course User Index [CH] Nanie Dunkleberger, Barbette Hair, MD   MDM Rules/Calculators/A&P                           Patient presents with chest pain and high blood pressure.  She is overall nontoxic-appearing.  Blood pressures elevated in the 005R systolic range.  Initially reported some improvement with aspirin and nitroglycerin.  She was seen and evaluated at urgent care initially and reported to the ED.  EKG shows no evidence of acute ischemic or arrhythmic changes.  She does have lateral T wave inversions which are similar to prior.  Troponin x2 negative.  This is overall reassuring although her heart score is 6 and she is high risk.  She is having ongoing chest pain and ongoing elevated blood pressures.  Question hypertensive urgency.  Patient was initially given IV hydralazine.  This did not seem to affect her blood pressure much.  She was subsequently given IV labetalol.  Given her high risk and ongoing high blood pressures, will admit for hypertensive urgency and ongoing chest pain  evaluation.   Final Clinical Impression(s) / ED Diagnoses Final diagnoses:  Hypertensive urgency  Precordial pain    Rx / DC Orders ED Discharge Orders     None        Dwana Garin, Barbette Hair, MD 09/30/21 [phone]

## 2021-09-30 NOTE — ED Notes (Signed)
Patient transported to Nuc Med 

## 2021-09-30 NOTE — ED Notes (Signed)
Pt ambulated to the RR well  

## 2021-09-30 NOTE — Consult Note (Signed)
NAME:  Bridget Oliver, MRN:  [id number], DOB:  [birth year], LOS: 0 ADMISSION DATE:  09/29/2021, CONSULTATION DATE:  11/8 REFERRING MD:  Benny Lennert, CHIEF COMPLAINT:  hypertensive urgency    History of Present Illness:  This is a 80 year old female who presented to the ER 11/8 w/cc: chest pain. First noted around 11 am on 11/7. Reported as substernal in nature & at times would radiate to left shoulder and jaw. No improvement w/ASA or tylenol. EMS called. They administered NTG SL w/ some relief of pain. In ER BP ranging from 180-205. Was pain free on arrival to ER. She was treated w/ PRN doses of IV labetolol and hydralazine. Was to get nuclear stress test but BP 240/80. She received additional oral antihypertensives.  After repeated efforts using PRN agents w/ on-going SBP in 240 so PCCM asked to eval for antihypertensive gtt.   Pertinent  Medical History   HTN, HLD, CKD (stg III) DM type II, Obesity, GERD, Gout, lactose intol,  Significant Hospital Events: Including procedures, antibiotic start and stop dates in addition to other pertinent events   Admitted 11/8 w/ cc Chest pain and hypertensive urgency. PCCM asked to see for nicardipine gtt  Interim History / Subjective:  Chest pain estimated at 1.5 out of 10  Objective   Blood pressure (Abnormal) 197/56, pulse 65, temperature 98.5 F (36.9 C), temperature source Oral, resp. rate 18, height 5\' 3"  (1.6 m), weight 93 kg, SpO2 98 %.       No intake or output data in the 24 hours ending 09/30/21 1542 Filed Weights   09/30/21 1524  Weight: 93 kg    Examination: General: 80 year old female resting in bed HENT: NCAT no JVD MMM Lungs: clear and no accessory use  Cardiovascular: RRR no MRG Abdomen: soft not tender  Extremities: warm and dry  Neuro: awake and oriented  GU: voids   Resolved Hospital Problem list     Assessment & Plan:  Hypertensive Urgency  SBP max 240 but trend in 220 range. Will aim for about 20-25% reduction for now and  then in am aim for more normal BP Plan Transfer to ICU  Start Nicardipine gtt Goal <170  today; in am will transition for more Normal BP <140 Cont oral Coreg, oral apresoline, losartan, and back daily lasix  Am chemistry  Tele   Chest pain. Negative CEs. NML EKG Plan Cont tele Cont asa  Stress test once BP controlled  CKD stage III Plan BP control Avoid too rapid decline in BP Am chem Renal adjust meds   DM type II  Plan Ssi  GERD Plan PPI    Best Practice (right click and "Reselect all SmartList Selections" daily)   Diet/type: Regular consistency (see orders) DVT prophylaxis: prophylactic heparin  GI prophylaxis: PPI Lines: N/A Foley:  N/A Code Status:  full code Last date of multidisciplinary goals of care discussion [NA]  Labs   CBC: Recent Labs  Lab 09/29/21 1737  WBC 12.0*  HGB 12.1  HCT 37.7  MCV 85.1  PLT 154    Basic Metabolic Panel: Recent Labs  Lab 09/29/21 1737  NA 137  K 4.2  CL 107  CO2 21*  GLUCOSE 96  BUN 20  CREATININE 1.41*  CALCIUM 9.3   GFR: Estimated Creatinine Clearance: 35 mL/min (A) (by C-G formula based on SCr of 1.41 mg/dL (H)). Recent Labs  Lab 09/29/21 1737  WBC 12.0*    Liver Function Tests: No results for  input(s): AST, ALT, ALKPHOS, BILITOT, PROT, ALBUMIN in the last 168 hours. No results for input(s): LIPASE, AMYLASE in the last 168 hours. No results for input(s): AMMONIA in the last 168 hours.  ABG No results found for: PHART, PCO2ART, PO2ART, HCO3, TCO2, ACIDBASEDEF, O2SAT   Coagulation Profile: No results for input(s): INR, PROTIME in the last 168 hours.  Cardiac Enzymes: No results for input(s): CKTOTAL, CKMB, CKMBINDEX, TROPONINI in the last 168 hours.  HbA1C: Hgb A1c MFr Bld  Date/Time Value Ref Range Status  05/06/2021 09:01 AM 5.7 4.6 - 6.5 % Final    Comment:    Glycemic Control Guidelines for People with Diabetes:Non Diabetic:  <6%Goal of Therapy: <7%Additional Action Suggested:  >8%    10/30/2020 11:17 AM 5.7 4.6 - 6.5 % Final    Comment:    Glycemic Control Guidelines for People with Diabetes:Non Diabetic:  <6%Goal of Therapy: <7%Additional Action Suggested:  >8%     CBG: No results for input(s): GLUCAP in the last 168 hours.  Review of Systems:   Review of Systems  Constitutional:  Negative for chills, fever, malaise/fatigue and weight loss.  HENT: Negative.    Eyes: Negative.   Respiratory:  Positive for shortness of breath.   Cardiovascular:  Positive for chest pain.  Gastrointestinal: Negative.   Genitourinary: Negative.   Musculoskeletal: Negative.   Skin: Negative.   Neurological: Negative.   Endo/Heme/Allergies: Negative.   Psychiatric/Behavioral: Negative.     Past Medical History:  She,  has a past medical history of CRI (chronic renal insufficiency), Diabetes mellitus (2011), Diarrhea, GERD (gastroesophageal reflux disease), Gout, Hyperglycemia, Hyperlipidemia, Hypertension, Lactose intolerance, Macular degeneration, Obesity, and Swelling of lower limb.   Surgical History:   Past Surgical History:  Procedure Laterality Date   [place]   CATARACT EXTRACTION  2010 & 2011   bilateral   CESAREAN SECTION  1969   LUMBAR LAMINECTOMY  2007   PARS PLANA VITRECTOMY W/ REPAIR OF MACULAR HOLE  2010 & 2011   Bilateral     Social History:   reports that she has never smoked. She has never used smokeless tobacco. She reports current alcohol use. She reports that she does not use drugs.   Family History:  Her family history includes Cancer in an other family member; Hypertension in her father and mother. There is no history of Colon cancer, Esophageal cancer, Stomach cancer, or Rectal cancer.   Allergies Allergies  Allergen Reactions   Oxycodone     Nausea Can take codein     Home Medications  Prior to Admission medications   Medication Sig Start Date End Date Taking? Authorizing Provider  acetaminophen  (TYLENOL) 325 MG tablet Take 2 tablets (650 mg total) by mouth every 6 (six) hours as needed for moderate pain. 06/08/14   Oswald Hillock, MD  acetaminophen-codeine (TYLENOL #3) 300-30 MG tablet Take 1 tablet by mouth every 8 (eight) hours as needed for moderate pain or severe pain. 05/15/16   [name], [name], MD  allopurinol (ZYLOPRIM) 100 MG tablet TAKE 1/2 TABLET BY MOUTH EVERY DAY 08/26/21   [name], [name], MD  amoxicillin (AMOXIL) 500 MG capsule Take by mouth. 07/11/21   [provider]  atorvastatin (LIPITOR) 40 MG tablet TAKE 1 TABLET BY MOUTH EVERY DAY 08/01/21   [name], [name], MD  Cholecalciferol (VITAMIN D3) 2000 UNITS capsule Take 1 capsule (2,000 Units total) by mouth daily. 08/16/15   [name], [name], MD  clindamycin (CLEOCIN) 300 MG capsule Take 300 mg by mouth every 12 (twelve) hours. 07/08/21   [provider]  Coenzyme Q10 (COQ10) 100 MG CAPS Take by mouth.    [provider]  diclofenac sodium (VOLTAREN) 1 % GEL Apply 4 g topically 4 (four) times daily. 05/28/16   Burnard Hawthorne, FNP  Fish Oil-Cholecalciferol (FISH OIL + D3) 1200-1000 MG-UNIT CAPS Take by mouth.    [provider]  furosemide (LASIX) 80 MG tablet TAKE 1 TABLET BY MOUTH EVERY DAY 12/03/20   [name], [name], MD  hydrALAZINE (APRESOLINE) 25 MG tablet TAKE 1 TABLET BY MOUTH THREE TIMES A DAY 11/27/19   [name], [name], MD  hydrALAZINE (APRESOLINE) 50 MG tablet Take 50 mg by mouth 3 (three) times daily. 09/13/19   [provider]  KLOR-CON M20 20 MEQ tablet TAKE 1 TABLET BY MOUTH EVERY DAY 08/26/21   [name], [name], MD  labetalol (NORMODYNE) 200 MG tablet Take 200 mg by mouth 2 (two) times daily. 09/27/18   [provider]  losartan (COZAAR) 100 MG tablet TAKE 1 TABLET BY MOUTH EVERYDAY AT BEDTIME 11/18/18   [provider]  Multiple Vitamins-Minerals (CENTRUM SILVER PO) Take 1 tablet by mouth daily.     [provider]   nitroGLYCERIN (NITROSTAT) 0.4 MG SL tablet Place 0.4 mg under the tongue every 5 (five) minutes as needed for chest pain. Reported on 05/28/2016    [provider]  pantoprazole (PROTONIX) 40 MG tablet TAKE 1 TABLET BY MOUTH EVERY DAY 01/31/21   [name], [name], MD  PFIZER-BIONT COVID-19 VAC-TRIS SUSP injection  03/26/21   [provider]  Polyethyl Glycol-Propyl Glycol (SYSTANE FREE OP) Place 1 drop into both eyes daily as needed.    [provider]  tiZANidine (ZANAFLEX) 4 MG tablet Take 1 tablet (4 mg total) by mouth every 8 (eight) hours as needed for muscle spasms. 05/15/16   [name], [name], MD  vitamin B-12 (CYANOCOBALAMIN) 1000 MCG tablet Take 500 mcg by mouth every other day.     [provider]  vitamin C (ASCORBIC ACID) 500 MG tablet Take 500 mg by mouth daily as needed (for cold).     [provider]     Critical care time: 32 min     Erick Colace ACNP-BC [place] Pager # [phone] OR # [phone] if no answer

## 2021-09-30 NOTE — Progress Notes (Signed)
      Bridget Oliver presented for a nuclear stress test today. BP 240/80, similar values on recheck.  Pt had not had am BP rx. Coreg 21.875 mg given (I dropped one of the 8 3.125 mg tabs) and losartan 100 mg. Plan is to recheck in 1 hour and decide if she is stable enough for test. No chest pain or SOB, ez neg MI, ECG not acute.   Pt BP recheck 1.5 hr later. 212/76. I feel her BP is too high to do the stress test safely.   I explained the situation to the pt and her dtr.   I explained (and Radiology Tech aware) that they can save the resting images done today and use them to complete the stress test after stress images are done tomorrow.   Dr Benny Lennert made aware of the situation.    Joden Bonsall, PA-C 09/30/2021, 1:32 PM

## 2021-09-30 NOTE — ED Notes (Signed)
[facility] called, they will plan to come and get the pt around 8:30am.  She should be kept NPO.  Pt and daughter reports she last ate some crackers around 1:30am

## 2021-10-01 DIAGNOSIS — I160 Hypertensive urgency: Secondary | ICD-10-CM | POA: Diagnosis not present

## 2021-10-01 DIAGNOSIS — I208 Other forms of angina pectoris: Secondary | ICD-10-CM | POA: Diagnosis not present

## 2021-10-01 DIAGNOSIS — R079 Chest pain, unspecified: Secondary | ICD-10-CM | POA: Diagnosis not present

## 2021-10-01 DIAGNOSIS — N1831 Chronic kidney disease, stage 3a: Secondary | ICD-10-CM | POA: Diagnosis not present

## 2021-10-01 DIAGNOSIS — E785 Hyperlipidemia, unspecified: Secondary | ICD-10-CM | POA: Diagnosis not present

## 2021-10-01 LAB — NM MYOCAR MULTI W/SPECT W/WALL MOTION / EF
Base ST Depression (mm): 0 mm
Estimated workload: 1
MPHR: 141 {beats}/min
Nuc Stress EF: 67 %
Peak HR: 88 {beats}/min
Percent HR: 62 %
Rest HR: 71 {beats}/min
Rest Nuclear Isotope Dose: 11 mCi
ST Depression (mm): 0 mm
Stress Nuclear Isotope Dose: 33 mCi
T Wave inversion (mm): 1 mm

## 2021-10-01 LAB — BASIC METABOLIC PANEL
Anion gap: 7 (ref 5–15)
BUN: 20 mg/dL (ref 8–23)
CO2: 23 mmol/L (ref 22–32)
Calcium: 8.8 mg/dL — ABNORMAL LOW (ref 8.9–10.3)
Chloride: 106 mmol/L (ref 98–111)
Creatinine, Ser: 1.5 mg/dL — ABNORMAL HIGH (ref 0.44–1.00)
GFR, Estimated: 35 mL/min — ABNORMAL LOW (ref >60)
Glucose, Bld: 107 mg/dL — ABNORMAL HIGH (ref 70–99)
Potassium: 3.9 mmol/L (ref 3.5–5.1)
Sodium: 136 mmol/L (ref 135–145)

## 2021-10-01 MED ORDER — AMLODIPINE BESYLATE 10 MG PO TABS
10.0000 mg | ORAL_TABLET | Freq: Every day | ORAL | Status: DC
  Administered 2021-10-01 – 2021-10-02 (×2): 10 mg via ORAL
  Filled 2021-10-01 (×2): qty 1

## 2021-10-01 MED ORDER — REGADENOSON 0.4 MG/5ML IV SOLN
INTRAVENOUS | Status: AC
  Filled 2021-10-01: qty 5

## 2021-10-01 MED ORDER — TECHNETIUM TC 99M TETROFOSMIN IV KIT
33.0000 | PACK | Freq: Once | INTRAVENOUS | Status: AC | PRN
  Administered 2021-10-01: 33 via INTRAVENOUS

## 2021-10-01 MED ORDER — MINOXIDIL 2.5 MG PO TABS
2.5000 mg | ORAL_TABLET | Freq: Every day | ORAL | Status: DC
  Administered 2021-10-01: 2.5 mg via ORAL
  Filled 2021-10-01 (×3): qty 1

## 2021-10-01 MED ORDER — HYDRALAZINE HCL 20 MG/ML IJ SOLN: 10.0000 mg | INTRAMUSCULAR | Status: DC | PRN

## 2021-10-01 MED ORDER — REGADENOSON 0.4 MG/5ML IV SOLN
0.4000 mg | Freq: Once | INTRAVENOUS | Status: AC
  Administered 2021-10-01: 0.4 mg via INTRAVENOUS
  Filled 2021-10-01: qty 5

## 2021-10-01 NOTE — Progress Notes (Signed)
TR[facility] night cross cover note:  I was notified by RN that patient's blood pressure around 3 AM was 182/55 with associated heart rate 59.  Per my review of most recent [place] documentation, it appears that target systolic blood pressure for this pt is less than 180 mmHg.  She is currently on the following scheduled oral antihypertensive medications: Coreg, losartan, hydralazine, with next hydralazine due at 6 AM, next Coreg at 8 AM, and the next losartan at 10 AM.  I asked that next dose of hydralazine be administered early (now), and also placed order for as needed IV hydralazine for systolic blood pressure greater than 180 mmHg. I conveyed this plan to patient's RN, and asked that at least 30 minutes elapse from time of administration of next oral hydralazine dose until evaluating for eligibility for prn IV Hydralazine.      Babs Bertin, DO Hospitalist

## 2021-10-01 NOTE — Consult Note (Signed)
   NAME:  Bridget Oliver, MRN:  [id number], DOB:  Apr 07, 1941, LOS: 0 ADMISSION DATE:  09/29/2021, CONSULTATION DATE:  11/8 REFERRING MD:  Benny Lennert, CHIEF COMPLAINT:  hypertensive urgency    History of Present Illness:  This is a 80 year old female who presented to the ER 11/8 w/cc: chest pain. First noted around 11 am on 11/7. Reported as substernal in nature & at times would radiate to left shoulder and jaw. No improvement w/ASA or tylenol. EMS called. They administered NTG SL w/ some relief of pain. In ER BP ranging from 180-205. Was pain free on arrival to ER. She was treated w/ PRN doses of IV labetolol and hydralazine. Was to get nuclear stress test but BP 240/80. She received additional oral antihypertensives.  After repeated efforts using PRN agents w/ on-going SBP in 240 so PCCM asked to eval for antihypertensive gtt.   Pertinent  Medical History   HTN, HLD, CKD (stg III) DM type II, Obesity, GERD, Gout, lactose intol,  Significant Hospital Events: Including procedures, antibiotic start and stop dates in addition to other pertinent events   Admitted 11/8 w/ cc Chest pain and hypertensive urgency. PCCM asked to see for nicardipine gtt 11/9 Did not require ICU transfer overnight with SBP ranging 180 or less   Interim History / Subjective:  Did not require ICU transfer overnight with SBP ranging 180 or less  She denies chest pain, headaches, visual changes, shortness of breath Took BP meds this morning  Objective   Blood pressure (!) 171/82, pulse 65, temperature 98.7 F (37.1 C), temperature source Oral, resp. rate 20, height 5\' 3"  (1.6 m), weight 89.7 kg, SpO2 96 %.        Intake/Output Summary (Last 24 hours) at 10/01/2021 1004 Last data filed at 10/01/2021 0600 Gross per 24 hour  Intake 240 ml  Output 650 ml  Net -410 ml   Filed Weights   09/30/21 1524 10/01/21 0326  Weight: 93 kg 89.7 kg   Physical Exam: General: Well-appearing, no acute distress HENT: [place], AT, OP clear,  MMM Eyes: EOMI, no scleral icterus Respiratory: Clear to auscultation bilaterally.  No crackles, wheezing or rales Cardiovascular: RRR, -M/R/G, no JVD Extremities:-Edema,-tenderness Neuro: AAO x4, CNII-XII grossly intact   Resolved Hospital Problem list     Assessment & Plan:  Hypertensive Urgency  SBP max 240 but trend in 220 range. Will aim for about 20-25% reduction for now and then in am aim for more normal BP Plan Hold on transfer to the ICU Telemetry If BP persistently high above 200s, can consider  Nicardipine gtt Goal <170   Continue PO regimen: Hydralazine 75 mg TID, Coreg 25 mg BID, amlodipine 10 mg QD  Pulmonary team will sign off. Please call for clinical changes requiring ICU transfer. Communicated plan with team   Best Practice (right click and "Reselect all SmartList Selections" daily)   Diet/type: Regular consistency (see orders) DVT prophylaxis: prophylactic heparin  GI prophylaxis: PPI Lines: N/A Foley:  N/A Code Status:  full code Last date of multidisciplinary goals of care discussion [NA]  Critical care time: N/A   Care Time: 62 min  Rodman Pickle, M.D. [facility] Pulmonary/Critical Care Medicine 10/01/2021 10:09 AM   See Amion for personal pager For hours between 7 PM to 7 AM, please call [facility] for urgent questions

## 2021-10-01 NOTE — Progress Notes (Signed)
Patient BP slowly starting to increase above 180. MD notified and gave verbal order to give scheduled PO hydralazine due at 0600 now.

## 2021-10-01 NOTE — Care Management Obs Status (Signed)
[place] NOTIFICATION   Patient Details  Name: Bridget Oliver MRN: [id number] Date of Birth: 31-Jul-1941   Medicare Observation Status Notification Given:  Yes    Bethena Roys, RN 10/01/2021, 3:12 PM

## 2021-10-01 NOTE — Assessment & Plan Note (Signed)
Patient's blood pressure has been very high since admission. NM stress was attempted on 09/30/2021, but stress portion of the test could not be performed due to SBP 200-227. Despite continuing the patient on home meds. BP was improved on Coreg 25 mg bid, amlodipine 10 mg QD, hydralazine 75 mg Q 8 hours, and losartan 100 mg daily, and plans to transfer the patient to the ICU for nicardipine gtt were able to be cancelled.  However, this morning again SBP was 204. I have added minoxidil 2.5 mg daily. Hopefully stress portion of NM study can be performed today.

## 2021-10-01 NOTE — Progress Notes (Signed)
Mobility Specialist Progress Note:   10/01/21 1713  Mobility  Activity Ambulated to bathroom  Level of Assistance Standby assist, set-up cues, supervision of patient - no hands on  Assistive Device None  Distance Ambulated (ft) 20 ft  Mobility Out of bed for toileting  Mobility Response Tolerated well  Mobility performed by Mobility specialist   Pt requesting to use restroom. Returned to bed with call bell in reach and all needs met.   [facility] Health and safety inspector Phone [phone]

## 2021-10-01 NOTE — Progress Notes (Signed)
PROGRESS NOTE  STEPHEN TURNBAUGH OZY:[id number] DOB: 12-29-1940 DOA: 09/29/2021 PCP: Cassandria Anger, MD  Brief History   This patient is a 80 yr old woman who presented to [facility] ED on 09/29/2021 with complaints of chest pain. In the ED her EKG demonstrated NSR with flattened T's laterally. Earlier EKG performed during admission demonstrated inverted T's laterally. However, these changes are also present on an EKG from 07/2018. Troponins were negative.   The patient carries a medical history significant for CKD, DM II, Hypertension and hyperlipidemia. For this reason Cardiology was consulted and a NM stress was ordered.    The patient was admitted this morning by my colleague, Dr. Tonie Griffith.   She states that she is compliant with her BP meds, but that she does not check her BP at home. It is unknown how long it has been out of control.   Since her admission, despite administration of Losartan 100 mg daily, Coreg 25 mg bid, and labetalol, the patient's blood pressure has continued to range between 182/63 and 227/76. For this reason only the resting portion of the stress was able to be performed today. She was returned to [facility].    When I saw the patient she was resting comfortably. She stated that she still had CP, but that it was much better.    I discussed the patient with ICU. She was to be transferred there to be started on a nicardipine drip to better control her BP. However, it was brought under control prior to transfer. Transfer was cancelled. PCCM assistance is appreciated.  This morning 90 minutes after receiving her Coreg, losartan, and hydralazine, the patient's blood pressure was 204/89. I added 2.5 mg of minoxidil. I anticipate that she will complete her stress today. Should we have more difficulty with her BP will consult nephrology for assistance.   Consultants  Cardiology  Procedures  NM stress - pending  Subjective  The patient is resting quietly. No acute distress.    Objective   Vitals:  Vitals:   10/01/21 0818 10/01/21 1009  BP: (!) 171/82 (!) 167/71  Pulse: 65 72  Resp: 20 13  Temp: 98.7 F (37.1 C)   SpO2: 96% 96%    Exam:  Constitutional:  The patient is awake, alert, and oriented x 3. No acute distress. Respiratory:  CTA bilaterally, no w/r/r.  Respiratory effort normal. No retractions or accessory muscle use Cardiovascular:  RRR, no m/r/g No LE extremity edema   Normal pedal pulses Abdomen:  Abdomen appears normal; no tenderness or masses No hernias No HSM Musculoskeletal:  Digits/nails BUE: no clubbing, cyanosis, petechiae, infection exam of joints, bones, muscles of at least one of following: head/neck, RUE, LUE, RLE, LLE   strength and tone normal, no atrophy, no abnormal movements No tenderness, masses Normal ROM, no contractures  gait and station Skin:  No rashes, lesions, ulcers palpation of skin: no induration or nodules Neurologic:  CN 2-12 intact Sensation all 4 extremities intact Psychiatric:  Mental status Mood, affect appropriate Orientation to person, place, time  judgment and insight appear intact     I have personally reviewed the following:   Today's Data  Vitals  Lab Data  BMP pending   Cardiology Data  NM stress pending  Scheduled Meds:  amLODipine  10 mg Oral Daily   aspirin EC  81 mg Oral Daily   atorvastatin  40 mg Oral Daily   carvedilol  25 mg Oral BID WC   enoxaparin (LOVENOX) injection  40 mg Subcutaneous Daily   hydrALAZINE  75 mg Oral Q8H   losartan  100 mg Oral Daily   minoxidil  2.5 mg Oral Daily   regadenoson  0.4 mg Intravenous Once    Principal Problem:   Chest pain Active Problems:   Dyslipidemia   Hypertensive urgency   CKD (chronic kidney disease) stage 3, GFR 30-59 ml/min (HCC)   LOS: 0 days    A & P   Chest pain She reports that yesterday morning around 11 AM she began to experience substernal chest pressure and pain.  She reports that it is a  tightness that would radiate into her left shoulder and into her left upper arm at times.  It is associated with shortness of breath but no nausea vomiting or jaw pain.  She took aspirin and Tylenol with only minimal improvement.  She states at its worst it was a 7 out of 10 on the pain scale.  EMS gave her nitroglycerin sublingual tablets which eased the chest pain.  When she arrived in the emergency room she had nitroglycerin paste placed on her which did ease off the chest pain.  She states she was just sitting when the chest pain began was not doing any type of activity.  She reports her last stress test was 9 to 10 years ago.  She does not remember anything making the chest pain worse or helping it at home.   Her chest pain did improve with improvement in BP. EKG demonstrated lateral ST segment changes such as flattening of T waves and inverted T's. However these were also present on EKG performed on 08/08/2018.  Troponins are negative. The patient underwent the resting portion of NM stress on 09/30/2021. Stress portion could not be done due to SBP in 200-227 range.   BP was improved on Coreg 25 mg bid, amlodipine 10 mg QD, hydralazine 75 mg Q 8 hours, and losartan 100 mg daily, but this morning again SBP was 204. I have added minoxidil 2.5 mg daily. Hopefully stress portion of NM study can be performed today.  Hypertensive urgency Patient's blood pressure has been very high since admission. NM stress was attempted on 09/30/2021, but stress portion of the test could not be performed due to SBP 200-227. Despite continuing the patient on home meds. BP was improved on Coreg 25 mg bid, amlodipine 10 mg QD, hydralazine 75 mg Q 8 hours, and losartan 100 mg daily, and plans to transfer the patient to the ICU for nicardipine gtt were able to be cancelled.  However, this morning again SBP was 204. I have added minoxidil 2.5 mg daily. Hopefully stress portion of NM study can be performed today.   CKD (chronic  kidney disease) stage 3, GFR 30-59 ml/min (HCC) It appears that the patient's creatinine is at or near baseline at 1.41. GFR 38. Will recheck today. Continue to monitor creatinine, electrolytes, and volume status. Avoid nephrotoxic medications, and extreme hypertension.  Dyslipidemia Continue lipitor 40 mg daily as at home.  OBESITY, MORBID Noted complicates all cares.  DVT prophylaxis: Lovenox Code Status: Full Code Family Communication: Family at bedside Disposition Plan: Home    Avonda Toso, DO [facility] Direct contact: see [email or web address]  7PM-7AM contact night coverage as above 10/01/2021, 12:47 PM  LOS: 0 days

## 2021-10-01 NOTE — Assessment & Plan Note (Signed)
She reports that yesterday morning around 11 AM she began to experience substernal chest pressure and pain.  She reports that it is a tightness that would radiate into her left shoulder and into her left upper arm at times.  It is associated with shortness of breath but no nausea vomiting or jaw pain.  She took aspirin and Tylenol with only minimal improvement.  She states at its worst it was a 7 out of 10 on the pain scale.  EMS gave her nitroglycerin sublingual tablets which eased the chest pain.  When she arrived in the emergency room she had nitroglycerin paste placed on her which did ease off the chest pain.  She states she was just sitting when the chest pain began was not doing any type of activity.  She reports her last stress test was 9 to 10 years ago.  She does not remember anything making the chest pain worse or helping it at home.   Her chest pain did improve with improvement in BP. EKG demonstrated lateral ST segment changes such as flattening of T waves and inverted T's. However these were also present on EKG performed on 08/08/2018.  Troponins are negative. The patient underwent the resting portion of NM stress on 09/30/2021. Stress portion could not be done due to SBP in 200-227 range.   BP was improved on Coreg 25 mg bid, amlodipine 10 mg QD, hydralazine 75 mg Q 8 hours, and losartan 100 mg daily, but this morning again SBP was 204. I have added minoxidil 2.5 mg daily. Hopefully stress portion of NM study can be performed today.

## 2021-10-01 NOTE — Assessment & Plan Note (Signed)
It appears that the patient's creatinine is at or near baseline at 1.41. GFR 38. Will recheck today. Continue to monitor creatinine, electrolytes, and volume status. Avoid nephrotoxic medications, and extreme hypertension.

## 2021-10-01 NOTE — Assessment & Plan Note (Signed)
Continue lipitor 40 mg daily as at home. 

## 2021-10-01 NOTE — Assessment & Plan Note (Signed)
Noted complicates all cares.

## 2021-10-02 DIAGNOSIS — N1831 Chronic kidney disease, stage 3a: Secondary | ICD-10-CM | POA: Diagnosis not present

## 2021-10-02 DIAGNOSIS — I160 Hypertensive urgency: Secondary | ICD-10-CM | POA: Diagnosis not present

## 2021-10-02 DIAGNOSIS — E785 Hyperlipidemia, unspecified: Secondary | ICD-10-CM | POA: Diagnosis not present

## 2021-10-02 LAB — BASIC METABOLIC PANEL
Anion gap: 7 (ref 5–15)
BUN: 26 mg/dL — ABNORMAL HIGH (ref 8–23)
CO2: 23 mmol/L (ref 22–32)
Calcium: 8.8 mg/dL — ABNORMAL LOW (ref 8.9–10.3)
Chloride: 106 mmol/L (ref 98–111)
Creatinine, Ser: 1.84 mg/dL — ABNORMAL HIGH (ref 0.44–1.00)
GFR, Estimated: 28 mL/min — ABNORMAL LOW (ref >60)
Glucose, Bld: 103 mg/dL — ABNORMAL HIGH (ref 70–99)
Potassium: 4.2 mmol/L (ref 3.5–5.1)
Sodium: 136 mmol/L (ref 135–145)

## 2021-10-02 LAB — CBC WITH DIFFERENTIAL/PLATELET
Abs Immature Granulocytes: 0.03 10*3/uL (ref 0.00–0.07)
Basophils Absolute: 0 10*3/uL (ref 0.0–0.1)
Basophils Relative: 0 %
Eosinophils Absolute: 0 10*3/uL (ref 0.0–0.5)
Eosinophils Relative: 1 %
HCT: 33 % — ABNORMAL LOW (ref 36.0–46.0)
Hemoglobin: 11 g/dL — ABNORMAL LOW (ref 12.0–15.0)
Immature Granulocytes: 0 %
Lymphocytes Relative: 24 %
Lymphs Abs: 2.1 10*3/uL (ref 0.7–4.0)
MCH: 27.8 pg (ref 26.0–34.0)
MCHC: 33.3 g/dL (ref 30.0–36.0)
MCV: 83.3 fL (ref 80.0–100.0)
Monocytes Absolute: 1 10*3/uL (ref 0.1–1.0)
Monocytes Relative: 12 %
Neutro Abs: 5.5 10*3/uL (ref 1.7–7.7)
Neutrophils Relative %: 63 %
Platelets: 210 10*3/uL (ref 150–400)
RBC: 3.96 MIL/uL (ref 3.87–5.11)
RDW: 13.6 % (ref 11.5–15.5)
WBC: 8.7 10*3/uL (ref 4.0–10.5)
nRBC: 0 % (ref 0.0–0.2)

## 2021-10-02 MED ORDER — CARVEDILOL 25 MG PO TABS: 25.0000 mg | ORAL_TABLET | Freq: Two times a day (BID) | ORAL | 0 refills | Status: DC

## 2021-10-02 MED ORDER — AMLODIPINE BESYLATE 10 MG PO TABS: 10.0000 mg | ORAL_TABLET | Freq: Every day | ORAL | 0 refills | Status: DC

## 2021-10-02 MED ORDER — MINOXIDIL 2.5 MG PO TABS
2.5000 mg | ORAL_TABLET | Freq: Every day | ORAL | 0 refills | Status: DC
Start: 2021-10-02 — End: 2021-11-05

## 2021-10-02 MED ORDER — ASPIRIN 81 MG PO TBEC: 81.0000 mg | DELAYED_RELEASE_TABLET | Freq: Every day | ORAL | 11 refills | Status: AC

## 2021-10-02 NOTE — Progress Notes (Signed)
Patient given discharge instructions and stated understanding. 

## 2021-10-02 NOTE — Plan of Care (Signed)

## 2021-10-07 NOTE — Discharge Summary (Signed)
Physician Discharge Summary  NYKIRA REDDIX Oliver:[id number] DOB: 05-24-1941 DOA: 09/29/2021  PCP: Cassandria Anger, MD  Admit date: 09/29/2021 Discharge date: 10/02/2021  Recommendations for Outpatient Follow-up:  Discharge to home. Check BP daily and write down pressures and take in with you when you visit PCP. Follow up with PCP in 7-10 days. Check chemistry when visit PCP. Report results to PCP.   Follow-up Information     [name], Bridget Lacks, MD. Go on 10/08/2021.   Specialty: Internal Medicine Why: @10 :40 am with Dr.Burns Contact information: [place] [address] [phone]                Discharge Diagnoses: Principal diagnosis is #1 Chest pain, low risk NM stress 10/01/2021 Dyslipidemia   Hypertensive urgency   CKD (chronic kidney disease) stage 3, GFR 30-59 ml/min (HCC)  Discharge Condition: Fair  Disposition: Home  Diet recommendation: Heart healthy  Filed Weights   09/30/21 1524 10/01/21 0326 10/02/21 0336  Weight: 93 kg 89.7 kg 92.8 kg    History of present illness:  Bridget Oliver is a 80 y.o. female with medical history significant for HTN, HLD, CKD, DMT2 diet controlled, obesity who presents for evaluation of chest pain.  She reports that yesterday morning around 11 AM she began to experience substernal chest pressure and pain.  She reports that it is a tightness that would radiate into her left shoulder and into her left upper arm at times.  It is associated with shortness of breath but no nausea vomiting or jaw pain.  She took aspirin and Tylenol with only minimal improvement.  She states at its worst it was a 7 out of 10 on the pain scale.  EMS gave her nitroglycerin sublingual tablets which eased the chest pain.  When she arrived in the emergency room she had nitroglycerin paste placed on her which did ease off the chest pain.  She states she was just sitting when the chest pain began was not doing any type of activity.  She  reports her last stress test was 9 to 10 years ago.  She does not remember anything making the chest pain worse or helping it at home.  Denies tobacco alcohol or illicit drug use.   ED Course: In the emergency room [name] has elevated blood pressure readings in the 180-205/65-70 range.  She is now chest pain-free.  She had an EKG that did not show a acute ST elevation or depression.  Troponins in the emergency room were 6 on 2 occasions.  She was given hydralazine and labetalol IV for her blood pressure.  Lab work reveals a WBC of 12,000 hemoglobin 12.1 hematocrit 37.7 platelets 220,000 sodium 137 potassium 4.2 chloride 107 bicarb 21 creatinine 1.41 BUN 20 calcium 9.3 glucose 96.  COVID swab is being obtained and is pending.  Hospitalist service asked to evaluate and manage patient for further work-up  Hospital Course:  This patient is a 80 yr old woman who presented to [facility] ED on 09/29/2021 with complaints of chest pain. In the ED her EKG demonstrated NSR with flattened T's laterally. Earlier EKG performed during admission demonstrated inverted T's laterally. However, these changes are also present on an EKG from 07/2018. Troponins were negative.   The patient carries a medical history significant for CKD, DM II, Hypertension and hyperlipidemia. For this reason Cardiology was consulted and a NM stress was ordered.    The patient was admitted this morning by my colleague, Dr. Tonie Griffith.  She states that she is compliant with her BP meds, but that she does not check her BP at home. It is unknown how long it has been out of control.   Since her admission, despite administration of Losartan 100 mg daily, Coreg 25 mg bid, and labetalol, the patient's blood pressure has continued to range between 182/63 and 227/76. For this reason only the resting portion of the stress was able to be performed today. She was returned to [facility].    When I saw the patient she was resting comfortably. She stated that she still  had CP, but that it was much better.    I discussed the patient with ICU. She was to be transferred there to be started on a nicardipine drip to better control her BP. However, it was brought under control prior to transfer. Transfer was cancelled. PCCM assistance is appreciated.   This morning 90 minutes after receiving her Coreg, losartan, and hydralazine, the patient's blood pressure was 204/89. I added 2.5 mg of minoxidil. Over the course of the next 24 hours the patient's blood pressure was brought under control.  The patient's NM stress was found to be negative for ischemia. The patient was discharged to home in fair condition.   Today's assessment: S: The patient is resting comfortably. O: Vitals:  Vitals:   10/02/21 0530 10/02/21 0957  BP: (!) 149/37 (!) 123/32  Pulse: 74   Resp:    Temp:    SpO2: 95%     Exam:  Constitutional:  The patient is awake, alert, and oriented x 3. No acute distress. ERespiratory:  No increased work of breathing. No wheezes, rales, or rhonchi No tactile fremitus Cardiovascular:  Regular rate and rhythm No murmurs, ectopy, or gallups. No lateral PMI. No thrills. Abdomen:  Abdomen is soft, non-tender, non-distended No hernias, masses, or organomegaly Normoactive bowel sounds.  Musculoskeletal:  No cyanosis, clubbing, or edema Skin:  No rashes, lesions, ulcers palpation of skin: no induration or nodules Neurologic:  CN 2-12 intact Sensation all 4 extremities intact Psychiatric:  Mental status Mood, affect appropriate Orientation to person, place, time  judgment and insight appear intact  Discharge Instructions  Discharge Instructions     Activity as tolerated - No restrictions   Complete by: As directed    Call MD for:  difficulty breathing, headache or visual disturbances   Complete by: As directed    Call MD for:  extreme fatigue   Complete by: As directed    Call MD for:  persistant dizziness or light-headedness    Complete by: As directed    Call MD for:  severe uncontrolled pain   Complete by: As directed    Diet - low sodium heart healthy   Complete by: As directed    Discharge instructions   Complete by: As directed    Discharge to home. Check BP daily and write down pressures and take in with you when you visit PCP. Follow up with PCP in 7-10 days. Check chemistry when visit PCP. Report results to PCP.   Increase activity slowly   Complete by: As directed       Allergies as of 10/02/2021       Reactions   Oxycodone    Nausea Can take codein        Medication List     STOP taking these medications    acetaminophen-codeine 300-30 MG tablet Commonly known as: TYLENOL #3   allopurinol 100 MG tablet Commonly known as: ZYLOPRIM  diclofenac sodium 1 % Gel Commonly known as: VOLTAREN   labetalol 200 MG tablet Commonly known as: NORMODYNE   Pfizer-BioNT COVID-19 Vac-TriS Susp injection Generic drug: COVID-19 mRNA Vac-TriS (Pfizer)   tiZANidine 4 MG tablet Commonly known as: Zanaflex       TAKE these medications    acetaminophen 325 MG tablet Commonly known as: Tylenol Take 2 tablets (650 mg total) by mouth every 6 (six) hours as needed for moderate pain.   amLODipine 10 MG tablet Commonly known as: NORVASC Take 1 tablet (10 mg total) by mouth daily.   aspirin 81 MG EC tablet Take 1 tablet (81 mg total) by mouth daily. Swallow whole.   atorvastatin 40 MG tablet Commonly known as: LIPITOR TAKE 1 TABLET BY MOUTH EVERY DAY   carvedilol 25 MG tablet Commonly known as: COREG Take 1 tablet (25 mg total) by mouth 2 (two) times daily with a meal.   CENTRUM SILVER PO Take 1 tablet by mouth daily.   CoQ10 100 MG Caps Take 1 capsule by mouth daily.   Fish Oil + D3 1200-1000 MG-UNIT Caps Take 1 capsule by mouth daily.   furosemide 80 MG tablet Commonly known as: LASIX TAKE 1 TABLET BY MOUTH EVERY DAY   hydrALAZINE 50 MG tablet Commonly known as:  APRESOLINE Take 50 mg by mouth 3 (three) times daily. What changed: Another medication with the same name was removed. Continue taking this medication, and follow the directions you see here.   Klor-Con M20 20 MEQ tablet Generic drug: potassium chloride SA TAKE 1 TABLET BY MOUTH EVERY DAY   losartan 100 MG tablet Commonly known as: COZAAR TAKE 1 TABLET BY MOUTH EVERYDAY AT BEDTIME   minoxidil 2.5 MG tablet Commonly known as: LONITEN Take 1 tablet (2.5 mg total) by mouth daily.   nitroGLYCERIN 0.4 MG SL tablet Commonly known as: NITROSTAT Place 0.4 mg under the tongue every 5 (five) minutes as needed for chest pain. Reported on 05/28/2016   pantoprazole 40 MG tablet Commonly known as: PROTONIX TAKE 1 TABLET BY MOUTH EVERY DAY   SYSTANE FREE OP Place 1 drop into both eyes daily as needed (dry eyes).   vitamin B-12 1000 MCG tablet Commonly known as: CYANOCOBALAMIN Take 500 mcg by mouth every other day.   Vitamin D3 50 MCG (2000 UT) capsule Take 1 capsule (2,000 Units total) by mouth daily.       Allergies  Allergen Reactions   Oxycodone     Nausea Can take codein    The results of significant diagnostics from this hospitalization (including imaging, microbiology, ancillary and laboratory) are listed below for reference.    Significant Diagnostic Studies: DG Chest 2 View  Result Date: 09/29/2021 CLINICAL DATA:  Chest pain EXAM: CHEST - 2 VIEW COMPARISON:  Chest x-ray 06/27/2018, CT chest 06/08/2014 FINDINGS: The heart and mediastinal contours are unchanged. Aortic calcification. No focal consolidation. No pulmonary edema. No pleural effusion. No pneumothorax. No acute osseous abnormality. IMPRESSION: No active cardiopulmonary disease. Electronically Signed   By: Iven Finn M.D.   On: 09/29/2021 18:28   NM Myocar Multi W/Spect W/Wall Motion / EF  Result Date: 10/01/2021   The study is normal. The study is low risk.   No ST deviation was noted.   LV perfusion is  normal. There is no evidence of ischemia. There is no evidence of infarction.   Left ventricular function is normal. Nuclear stress EF: 67 %. The left ventricular ejection fraction is hyperdynamic (>65%). End  diastolic cavity size is normal. End systolic cavity size is normal.   Prior study available for comparison from 06/08/2014. No changes compared to prior study.    Microbiology: Recent Results (from the past 240 hour(s))  Resp Panel by RT-PCR (Flu A&B, Covid) Nasopharyngeal Swab     Status: None   Collection Time: 09/30/21  1:18 PM   Specimen: Nasopharyngeal Swab; Nasopharyngeal(NP) swabs in vial transport medium  Result Value Ref Range Status   SARS Coronavirus 2 by RT PCR NEGATIVE NEGATIVE Final    Comment: (NOTE) SARS-CoV-2 target nucleic acids are NOT DETECTED.  The SARS-CoV-2 RNA is generally detectable in upper respiratory specimens during the acute phase of infection. The lowest concentration of SARS-CoV-2 viral copies this assay can detect is 138 copies/mL. A negative result does not preclude SARS-Cov-2 infection and should not be used as the sole basis for treatment or other patient management decisions. A negative result may occur with  improper specimen collection/handling, submission of specimen other than nasopharyngeal swab, presence of viral mutation(s) within the areas targeted by this assay, and inadequate number of viral copies(<138 copies/mL). A negative result must be combined with clinical observations, patient history, and epidemiological information. The expected result is Negative.  Fact Sheet for Patients:  EntrepreneurPulse.com.au  Fact Sheet for Healthcare Providers:  IncredibleEmployment.be  This test is no t yet approved or cleared by the [place] FDA and  has been authorized for detection and/or diagnosis of SARS-CoV-2 by FDA under an Emergency Use Authorization (EUA). This EUA will remain  in effect (meaning  this test can be used) for the duration of the COVID-19 declaration under Section 564(b)(1) of the Act, 21 U.S.C.section 360bbb-3(b)(1), unless the authorization is terminated  or revoked sooner.       Influenza A by PCR NEGATIVE NEGATIVE Final   Influenza B by PCR NEGATIVE NEGATIVE Final    Comment: (NOTE) The Xpert Xpress SARS-CoV-2/FLU/RSV plus assay is intended as an aid in the diagnosis of influenza from Nasopharyngeal swab specimens and should not be used as a sole basis for treatment. Nasal washings and aspirates are unacceptable for Xpert Xpress SARS-CoV-2/FLU/RSV testing.  Fact Sheet for Patients: EntrepreneurPulse.com.au  Fact Sheet for Healthcare Providers: IncredibleEmployment.be  This test is not yet approved or cleared by the [place] FDA and has been authorized for detection and/or diagnosis of SARS-CoV-2 by FDA under an Emergency Use Authorization (EUA). This EUA will remain in effect (meaning this test can be used) for the duration of the COVID-19 declaration under Section 564(b)(1) of the Act, 21 U.S.C. section 360bbb-3(b)(1), unless the authorization is terminated or revoked.  Performed at [facility], [place] [address] [place], [place] [address]      Labs: Basic Metabolic Panel: Recent Labs  Lab 10/01/21 1436 10/02/21 0423  NA 136 136  K 3.9 4.2  CL 106 106  CO2 23 23  GLUCOSE 107* 103*  BUN 20 26*  CREATININE 1.50* 1.84*  CALCIUM 8.8* 8.8*   Liver Function Tests: No results for input(s): AST, ALT, ALKPHOS, BILITOT, PROT, ALBUMIN in the last 168 hours. No results for input(s): LIPASE, AMYLASE in the last 168 hours. No results for input(s): AMMONIA in the last 168 hours. CBC: Recent Labs  Lab 10/02/21 0423  WBC 8.7  NEUTROABS 5.5  HGB 11.0*  HCT 33.0*  MCV 83.3  PLT 210   Cardiac Enzymes: No results for input(s): CKTOTAL, CKMB, CKMBINDEX, TROPONINI in the last 168 hours. BNP: BNP (last  3 results)  No results for input(s): BNP in the last 8760 hours.  ProBNP (last 3 results) No results for input(s): PROBNP in the last 8760 hours.  CBG: No results for input(s): GLUCAP in the last 168 hours.  Principal Problem:   Chest pain Active Problems:   Dyslipidemia   OBESITY, MORBID   Hypertensive urgency   CKD (chronic kidney disease) stage 3, GFR 30-59 ml/min (HCC)   Time coordinating discharge: 38 minutes  Signed:        Cordarryl Monrreal, DO [facility]  10/07/2021, 4:42 PM

## 2021-10-07 NOTE — Progress Notes (Signed)
Subjective:    Patient ID: Bridget Oliver, female    DOB: March 01, 1941, 80 y.o.   MRN: [id number]  This visit occurred during the SARS-CoV-2 public health emergency.  Safety protocols were in place, including screening questions prior to the visit, additional usage of staff PPE, and extensive cleaning of exam room while observing appropriate contact time as indicated for disinfecting solutions.     HPI The patient is here for follow up from the hospital.   Urgent care 11/7 - chest pain - left sided chest pain that started a few hours ago.  The pain was initially in the left shoulder and it radiated to the chest.  It started after her eye doctor appointment and she was in an awkward position having her eyes examined.  She took tylenol and ASA which has helped. No SOB.  Blood pressure was elevated.  There were slight EKG changes need V3  She was transported to the ED  Hospital 11/7-11/10 for chest pain  Discharge to home-advised to check blood pressure daily and bring log in for review Needs CMP rechecked  She was admitted for chest pain, hypertensive urgency.  Chest pain was left-sided and started the morning prior to admission and radiated to the left shoulder and arm.  Was associated with shortness of breath, but no other symptoms.  Rates a 7/10 in intensity.  EMS gave her sublingual nitroglycerin, which help relieve pain.  In the emergency room no EKG changes were noted.  Troponins were negative.  Blood pressure remained elevated on home medications.  She had a nuclear medicine stress test that was found to be negative for ischemia and she was discharged home.  On the day of discharge her blood pressure was 159/37, 123/32.  Some of her blood pressure medications were changed-she was placed on minoxidil and her labetalol was changed to carvedilol.  She was also started on amlodipine.  She is still having some pain, but now it is focused in the central mid back.  It has improved.  She does  not think that anything makes it worse such as movement or changes in position.  She thinks either Tylenol or Gas-X helped.      At home her BP has been 153/56, 172/54, 136/41, 154/64, 127/43 with HR 66-76  She has had a flare of external hemorrhoids and wondered what she could do for that.  She states a little bit of constipation.  She denies any bleeding.  She does has discomfort.  Medications and allergies reviewed with patient and updated if appropriate.  Patient Active Problem List   Diagnosis Date Noted   Chest pain 09/30/2021   Hypertensive urgency 09/30/2021   CKD (chronic kidney disease) stage 3, GFR 30-59 ml/min (HCC) 09/30/2021   Gouty arthritis 09/12/2021   Hypertension 06/10/2021   Mixed hyperlipidemia 08/08/2018   Cough 06/27/2018   GERD (gastroesophageal reflux disease) 06/24/2017   LBP radiating to right leg 05/15/2016   Well adult exam 12/19/2015   History of colonic polyps 11/26/2014   Right tennis elbow 11/21/2013   Right forearm pain 11/13/2013   Wrist pain 06/06/2013   DM (diabetes mellitus), type 2 with renal complications ([place]) 83/15/1761   Hand pain, right 05/07/2011   Bursitis of elbow 05/07/2011   Atypical chest pain 03/30/2011   Abdominal pain, other specified site 03/30/2011   Hypertensive cardiomegaly 01/12/2011   Carotid artery stenosis 03/17/2010   Gout 03/10/2010   Disorder of kidney and ureter 07/20/2008  Dyslipidemia 03/21/2008   OBESITY, MORBID 03/21/2008   B12 deficiency 09/21/2007   Hypertensive renal disease 09/21/2007   Edema 09/21/2007    Current Outpatient Medications on File Prior to Visit  Medication Sig Dispense Refill   acetaminophen (TYLENOL) 325 MG tablet Take 2 tablets (650 mg total) by mouth every 6 (six) hours as needed for moderate pain. 30 tablet 0   amLODipine (NORVASC) 10 MG tablet Take 1 tablet (10 mg total) by mouth daily. 30 tablet 0   aspirin EC 81 MG EC tablet Take 1 tablet (81 mg total) by mouth daily. Swallow  whole. 30 tablet 11   atorvastatin (LIPITOR) 40 MG tablet TAKE 1 TABLET BY MOUTH EVERY DAY 90 tablet 2   carvedilol (COREG) 25 MG tablet Take 1 tablet (25 mg total) by mouth 2 (two) times daily with a meal. 60 tablet 0   Cholecalciferol (VITAMIN D3) 2000 UNITS capsule Take 1 capsule (2,000 Units total) by mouth daily. 100 capsule 3   Coenzyme Q10 (COQ10) 100 MG CAPS Take 1 capsule by mouth daily.     Fish Oil-Cholecalciferol (FISH OIL + D3) 1200-1000 MG-UNIT CAPS Take 1 capsule by mouth daily.     furosemide (LASIX) 80 MG tablet TAKE 1 TABLET BY MOUTH EVERY DAY 90 tablet 3   hydrALAZINE (APRESOLINE) 50 MG tablet Take 50 mg by mouth 3 (three) times daily.     KLOR-CON M20 20 MEQ tablet TAKE 1 TABLET BY MOUTH EVERY DAY 90 tablet 3   losartan (COZAAR) 100 MG tablet TAKE 1 TABLET BY MOUTH EVERYDAY AT BEDTIME     minoxidil (LONITEN) 2.5 MG tablet Take 1 tablet (2.5 mg total) by mouth daily. 30 tablet 0   Multiple Vitamins-Minerals (CENTRUM SILVER PO) Take 1 tablet by mouth daily.      nitroGLYCERIN (NITROSTAT) 0.4 MG SL tablet Place 0.4 mg under the tongue every 5 (five) minutes as needed for chest pain. Reported on 05/28/2016     pantoprazole (PROTONIX) 40 MG tablet TAKE 1 TABLET BY MOUTH EVERY DAY 90 tablet 3   Polyethyl Glycol-Propyl Glycol (SYSTANE FREE OP) Place 1 drop into both eyes daily as needed (dry eyes).     vitamin B-12 (CYANOCOBALAMIN) 1000 MCG tablet Take 500 mcg by mouth every other day.      No current facility-administered medications on file prior to visit.    Past Medical History:  Diagnosis Date   CRI (chronic renal insufficiency)    Stage 3   Diabetes mellitus 2011   Type II diet controlled   Diarrhea    GERD (gastroesophageal reflux disease)    Gout    Hyperglycemia    Hyperlipidemia    Hypertension    Lactose intolerance    Macular degeneration    Obesity    Swelling of lower limb     Past Surgical History:  Procedure Laterality Date   [place]   CATARACT EXTRACTION  2010 & 2011   bilateral   CESAREAN SECTION  1969   LUMBAR LAMINECTOMY  2007   PARS PLANA VITRECTOMY W/ REPAIR OF MACULAR HOLE  2010 & 2011   Bilateral    Social History   Socioeconomic History   Marital status: Married    Spouse name: Not on file   Number of children: Not on file   Years of education: Not on file   Highest education level: Not on file  Occupational History   Not on file  Tobacco Use   Smoking status: Never   Smokeless tobacco: Never  Substance and Sexual Activity   Alcohol use: Yes    Alcohol/week: 0.0 standard drinks    Comment: rarely   Drug use: No   Sexual activity: Not on file  Other Topics Concern   Not on file  Social History Narrative   Not on file   Social Determinants of Health   Financial Resource Strain: Not on file  Food Insecurity: Not on file  Transportation Needs: Not on file  Physical Activity: Not on file  Stress: Not on file  Social Connections: Not on file    Family History  Problem Relation Age of Onset   Hypertension Mother    Hypertension Father    Cancer Other        Lung   Colon cancer Neg Hx    Esophageal cancer Neg Hx    Stomach cancer Neg Hx    Rectal cancer Neg Hx     Review of Systems  Constitutional:  Negative for fever.  Respiratory:  Positive for shortness of breath (with walking long distances). Negative for cough and wheezing.   Cardiovascular:  Negative for chest pain, palpitations and leg swelling.  Gastrointestinal:  Positive for constipation (mild). Negative for anal bleeding.  Musculoskeletal:  Positive for back pain.  Neurological:  Negative for dizziness, light-headedness and headaches.      Objective:   Vitals:   10/08/21 1054  BP: (!) 140/52  Pulse: 73  Temp: 98.1 F (36.7 C)  SpO2: 97%   BP Readings from Last 3 Encounters:  10/08/21 (!) 140/52  10/02/21 (!) 123/32  09/29/21 (!) 183/66   Wt Readings from Last 3 Encounters:   10/08/21 210 lb (95.3 kg)  10/02/21 204 lb 9.6 oz (92.8 kg)  05/06/21 211 lb 0.6 oz (95.7 kg)   Body mass index is 37.2 kg/m.   Physical Exam    Constitutional: Appears well-developed and well-nourished. No distress.  HENT:  Head: Normocephalic and atraumatic.  Neck: Neck supple. No tracheal deviation present. No thyromegaly present.  No cervical lymphadenopathy Cardiovascular: Normal rate, regular rhythm and normal heart sounds.   No murmur heard. No carotid bruit .  No edema Pulmonary/Chest: Effort normal and breath sounds normal. No respiratory distress. No has no wheezes. No rales. Musculoskeletal: No chest wall tenderness.  No left shoulder or arm tenderness with movement or palpation, area of pain is mid central back in the area is not tender to palpation in your painful with movement. Skin: Skin is warm and dry. Not diaphoretic.  Psychiatric: Normal mood and affect. Behavior is normal.   Lab Results  Component Value Date   WBC 8.7 10/02/2021   HGB 11.0 (L) 10/02/2021   HCT 33.0 (L) 10/02/2021   PLT 210 10/02/2021   GLUCOSE 103 (H) 10/02/2021   CHOL 151 09/30/2021   TRIG 50 09/30/2021   HDL 38 (L) 09/30/2021   LDLDIRECT 127.9 07/14/2011   LDLCALC 103 (H) 09/30/2021   ALT 16 10/30/2020   AST 18 10/30/2020   NA 136 10/02/2021   K 4.2 10/02/2021   CL 106 10/02/2021   CREATININE 1.84 (H) 10/02/2021   BUN 26 (H) 10/02/2021   CO2 23 10/02/2021   TSH 3.27 05/06/2021   INR 0.98 06/07/2014   HGBA1C 5.7 05/06/2021   MICROALBUR 1.9 04/29/2020    NM Myocar Multi W/Spect W/Wall Motion / EF   The study is normal. The study is low risk.   No  ST deviation was noted.   LV perfusion is normal. There is no evidence of ischemia. There is no  evidence of infarction.   Left ventricular function is normal. Nuclear stress EF: 67 %. The left  ventricular ejection fraction is hyperdynamic (>65%). End diastolic cavity  size is normal. End systolic cavity size is normal.   Prior  study available for comparison from 06/08/2014. No changes  compared to prior study.    Assessment & Plan:    Chest pain: Evaluation in the hospital including EKG, troponin x2, chest x-ray and stress test all negative for cardiac cause Pain was likely muscular skeletal in nature-possibly from being in an awkward position at the eye doctors appointment that morning She is still having some discomfort in her mid back and we will treat that symptomatically  Hypertension, chronic kidney disease: Chronic CMP today Better controlled here today, but seems to be variable at home and not ideally controlled-May need to bring cuff in to make sure it is accurate Continue current medications today-amlodipine 10 mg daily, Coreg 25 mg twice daily, hydralazine 50 mg 3 times daily, losartan 100 mg daily and minoxidil 2.5 mg daily Continue to monitor blood pressure at home Stressed low-sodium diet, regular exercise and ideally weight loss Has an appointment with Dr. Alain Marion next month for follow-up and is due for follow-up with her kidney doctor-Dr. Johnnye Sima, middle back pain: Likely musculoskeletal-the pain she was experiencing has settled here It is improving and now mild Can take Tylenol as needed  External hemorrhoids: Acute This is a recent flare and she wanted to know what she can take Will prescribe hydrocortisone 2.5% cream to use twice daily as needed Discussed increasing fiber, taking a stool softener or adjusting her diet to prevent constipation, which is probably the most common cause of a flare   I spent 30 minutes dedicated to the care of this patient on the date of this encounter including review of recent labs, imaging and procedures, speciality notes, obtaining history, communicating with the patient, ordering medications, tests, and documenting clinical information in the EHR

## 2021-10-08 ENCOUNTER — Ambulatory Visit (INDEPENDENT_AMBULATORY_CARE_PROVIDER_SITE_OTHER): Payer: Medicare HMO | Admitting: Internal Medicine

## 2021-10-08 ENCOUNTER — Other Ambulatory Visit: Payer: Self-pay

## 2021-10-08 ENCOUNTER — Encounter: Payer: Self-pay | Admitting: Internal Medicine

## 2021-10-08 VITALS — BP 140/52 | HR 73 | Temp 98.1°F | Ht 63.0 in | Wt 210.0 lb

## 2021-10-08 DIAGNOSIS — I208 Other forms of angina pectoris: Secondary | ICD-10-CM

## 2021-10-08 DIAGNOSIS — R0789 Other chest pain: Secondary | ICD-10-CM | POA: Diagnosis not present

## 2021-10-08 DIAGNOSIS — I10 Essential (primary) hypertension: Secondary | ICD-10-CM

## 2021-10-08 DIAGNOSIS — K644 Residual hemorrhoidal skin tags: Secondary | ICD-10-CM

## 2021-10-08 DIAGNOSIS — M546 Pain in thoracic spine: Secondary | ICD-10-CM

## 2021-10-08 LAB — COMPREHENSIVE METABOLIC PANEL
ALT: 13 U/L (ref 0–35)
AST: 15 U/L (ref 0–37)
Albumin: 3.6 g/dL (ref 3.5–5.2)
Alkaline Phosphatase: 64 U/L (ref 39–117)
BUN: 41 mg/dL — ABNORMAL HIGH (ref 6–23)
CO2: 21 mEq/L (ref 19–32)
Calcium: 8.8 mg/dL (ref 8.4–10.5)
Chloride: 103 mEq/L (ref 96–112)
Creatinine, Ser: 2.48 mg/dL — ABNORMAL HIGH (ref 0.40–1.20)
GFR: 18.03 mL/min — ABNORMAL LOW (ref >60.00)
Glucose, Bld: 113 mg/dL — ABNORMAL HIGH (ref 70–99)
Potassium: 4.3 mEq/L (ref 3.5–5.1)
Sodium: 134 mEq/L — ABNORMAL LOW (ref 135–145)
Total Bilirubin: 0.5 mg/dL (ref 0.2–1.2)
Total Protein: 6.2 g/dL (ref 6.0–8.3)

## 2021-10-08 MED ORDER — HYDROCORTISONE 2.5 % EX CREA: TOPICAL_CREAM | Freq: Two times a day (BID) | CUTANEOUS | 0 refills | Status: DC

## 2021-10-08 NOTE — Patient Instructions (Addendum)
  Blood work was ordered.     Medications changes include :   hydrocortisone 2.5 % cream for the hemorrhoids  Your prescription(s) have been submitted to your pharmacy. Please take as directed and contact our office if you believe you are having problem(s) with the medication(s).

## 2021-10-20 DIAGNOSIS — H40022 Open angle with borderline findings, high risk, left eye: Secondary | ICD-10-CM | POA: Diagnosis not present

## 2021-11-05 ENCOUNTER — Encounter: Payer: Self-pay | Admitting: Internal Medicine

## 2021-11-05 ENCOUNTER — Ambulatory Visit (INDEPENDENT_AMBULATORY_CARE_PROVIDER_SITE_OTHER): Payer: Medicare HMO | Admitting: Internal Medicine

## 2021-11-05 VITALS — BP 132/48 | HR 66 | Temp 98.1°F | Ht 63.0 in | Wt 209.0 lb

## 2021-11-05 DIAGNOSIS — I208 Other forms of angina pectoris: Secondary | ICD-10-CM

## 2021-11-05 DIAGNOSIS — Z23 Encounter for immunization: Secondary | ICD-10-CM | POA: Diagnosis not present

## 2021-11-05 DIAGNOSIS — R0789 Other chest pain: Secondary | ICD-10-CM | POA: Diagnosis not present

## 2021-11-05 DIAGNOSIS — E538 Deficiency of other specified B group vitamins: Secondary | ICD-10-CM | POA: Diagnosis not present

## 2021-11-05 DIAGNOSIS — N184 Chronic kidney disease, stage 4 (severe): Secondary | ICD-10-CM | POA: Diagnosis not present

## 2021-11-05 DIAGNOSIS — I129 Hypertensive chronic kidney disease with stage 1 through stage 4 chronic kidney disease, or unspecified chronic kidney disease: Secondary | ICD-10-CM

## 2021-11-05 DIAGNOSIS — Z0000 Encounter for general adult medical examination without abnormal findings: Secondary | ICD-10-CM

## 2021-11-05 MED ORDER — AMLODIPINE BESYLATE 10 MG PO TABS: 10.0000 mg | ORAL_TABLET | Freq: Every day | ORAL | 3 refills | Status: AC

## 2021-11-05 MED ORDER — CARVEDILOL 25 MG PO TABS
25.0000 mg | ORAL_TABLET | Freq: Two times a day (BID) | ORAL | 3 refills | Status: DC
Start: 2021-11-05 — End: 2022-10-26

## 2021-11-05 MED ORDER — MINOXIDIL 2.5 MG PO TABS: 2.5000 mg | ORAL_TABLET | Freq: Every day | ORAL | 3 refills | Status: DC

## 2021-11-05 MED ORDER — ALLOPURINOL 100 MG PO TABS: 50.0000 mg | ORAL_TABLET | Freq: Every day | ORAL | 3 refills | Status: DC

## 2021-11-05 NOTE — Assessment & Plan Note (Signed)
Hydrate well 

## 2021-11-05 NOTE — Progress Notes (Signed)
Subjective:  Patient ID: Bridget Oliver, female    DOB: Apr 20, 1941  Age: 80 y.o. MRN: [id number]  CC: Annual Exam (Pt states hosp took her off her allopurinol want to discuss)   HPI SYRAI GLADWIN presents for annual physical  Per recent hx:  "Admit date: 09/29/2021 Discharge date: 10/02/2021   Recommendations for Outpatient Follow-up:  Discharge to home. Check BP daily and write down pressures and take in with you when you visit PCP. Follow up with PCP in 7-10 days. Check chemistry when visit PCP. Report results to PCP.     Follow-up Information       Savita Runner, [name], MD. Go on 10/08/2021.   Specialty: Internal Medicine Why: @10 :40 am with Dr.Burns Contact information: [place] [address] [phone]                        Discharge Diagnoses: Principal diagnosis is #1 Chest pain, low risk NM stress 10/01/2021 Dyslipidemia   Hypertensive urgency   CKD (chronic kidney disease) stage 3, GFR 30-59 ml/min (HCC)   Discharge Condition: Fair   Disposition: Home   Diet recommendation: Heart healthy        Filed Weights    09/30/21 1524 10/01/21 0326 10/02/21 0336  Weight: 93 kg 89.7 kg 92.8 kg      History of present illness:  Bridget Oliver is a 80 y.o. female with medical history significant for HTN, HLD, CKD, DMT2 diet controlled, obesity who presents for evaluation of chest pain.  She reports that yesterday morning around 11 AM she began to experience substernal chest pressure and pain.  She reports that it is a tightness that would radiate into her left shoulder and into her left upper arm at times.  It is associated with shortness of breath but no nausea vomiting or jaw pain.  She took aspirin and Tylenol with only minimal improvement.  She states at its worst it was a 7 out of 10 on the pain scale.  EMS gave her nitroglycerin sublingual tablets which eased the chest pain.  When she arrived in the emergency room she had nitroglycerin paste  placed on her which did ease off the chest pain.  She states she was just sitting when the chest pain began was not doing any type of activity.  She reports her last stress test was 9 to 10 years ago.  She does not remember anything making the chest pain worse or helping it at home.  Denies tobacco alcohol or illicit drug use.   ED Course: In the emergency room Ms. Finamore has elevated blood pressure readings in the 180-205/65-70 range.  She is now chest pain-free.  She had an EKG that did not show a acute ST elevation or depression.  Troponins in the emergency room were 6 on 2 occasions.  She was given hydralazine and labetalol IV for her blood pressure.  Lab work reveals a WBC of 12,000 hemoglobin 12.1 hematocrit 37.7 platelets 220,000 sodium 137 potassium 4.2 chloride 107 bicarb 21 creatinine 1.41 BUN 20 calcium 9.3 glucose 96.  COVID swab is being obtained and is pending.  Hospitalist service asked to evaluate and manage patient for further work-up   Hospital Course:  This patient is a 80 yr old woman who presented to [facility] ED on 09/29/2021 with complaints of chest pain. In the ED her EKG demonstrated NSR with flattened T's laterally. Earlier EKG performed during admission demonstrated inverted T's  laterally. However, these changes are also present on an EKG from 07/2018. Troponins were negative.   The patient carries a medical history significant for CKD, DM II, Hypertension and hyperlipidemia. For this reason Cardiology was consulted and a NM stress was ordered.    The patient was admitted this morning by my colleague, Dr. Tonie Griffith.   She states that she is compliant with her BP meds, but that she does not check her BP at home. It is unknown how long it has been out of control.   Since her admission, despite administration of Losartan 100 mg daily, Coreg 25 mg bid, and labetalol, the patient's blood pressure has continued to range between 182/63 and 227/76. For this reason only the resting portion of  the stress was able to be performed today. She was returned to [facility].    When I saw the patient she was resting comfortably. She stated that she still had CP, but that it was much better.    I discussed the patient with ICU. She was to be transferred there to be started on a nicardipine drip to better control her BP. However, it was brought under control prior to transfer. Transfer was cancelled. PCCM assistance is appreciated.   This morning 90 minutes after receiving her Coreg, losartan, and hydralazine, the patient's blood pressure was 204/89. I added 2.5 mg of minoxidil. Over the course of the next 24 hours the patient's blood pressure was brought under control.   The patient's NM stress was found to be negative for ischemia. The patient was discharged to home in fair condition. "   Immunization History  Administered Date(s) Administered   Fluad Quad(high Dose 65+) 08/11/2019, 08/19/2020   Influenza Whole 10/07/2010, 07/25/2011, 08/26/2012   Influenza, High Dose Seasonal PF 09/14/2013, 09/11/2017, 09/13/2018, 08/23/2021, 09/10/2021   Influenza,inj,Quad PF,6+ Mos 07/25/2014, 08/16/2015   PFIZER Comirnaty(Gray Top)Covid-19 Tri-Sucrose Vaccine 03/26/2021   PFIZER(Purple Top)SARS-COV-2 Vaccination 12/12/2019, 01/02/2020, 08/27/2020   Pneumococcal Conjugate-13 11/26/2014   Pneumococcal Polysaccharide-23 11/10/2011   Tdap 11/10/2011, 06/10/2021   Zoster Recombinat (Shingrix) 09/08/2018, 01/10/2019   Zoster, Live 02/28/2008     Outpatient Medications Prior to Visit  Medication Sig Dispense Refill   acetaminophen (TYLENOL) 325 MG tablet Take 2 tablets (650 mg total) by mouth every 6 (six) hours as needed for moderate pain. 30 tablet 0   amLODipine (NORVASC) 10 MG tablet Take 1 tablet (10 mg total) by mouth daily. 30 tablet 0   aspirin EC 81 MG EC tablet Take 1 tablet (81 mg total) by mouth daily. Swallow whole. 30 tablet 11   atorvastatin (LIPITOR) 40 MG tablet TAKE 1 TABLET BY MOUTH EVERY DAY  90 tablet 2   carvedilol (COREG) 25 MG tablet Take 1 tablet (25 mg total) by mouth 2 (two) times daily with a meal. 60 tablet 0   Cholecalciferol (VITAMIN D3) 2000 UNITS capsule Take 1 capsule (2,000 Units total) by mouth daily. 100 capsule 3   Coenzyme Q10 (COQ10) 100 MG CAPS Take 1 capsule by mouth daily.     Fish Oil-Cholecalciferol (FISH OIL + D3) 1200-1000 MG-UNIT CAPS Take 1 capsule by mouth daily.     furosemide (LASIX) 80 MG tablet TAKE 1 TABLET BY MOUTH EVERY DAY 90 tablet 3   hydrALAZINE (APRESOLINE) 50 MG tablet Take 50 mg by mouth 3 (three) times daily.     hydrocortisone 2.5 % cream Apply topically 2 (two) times daily. 30 g 0   KLOR-CON M20 20 MEQ tablet TAKE 1 TABLET BY  MOUTH EVERY DAY 90 tablet 3   losartan (COZAAR) 100 MG tablet TAKE 1 TABLET BY MOUTH EVERYDAY AT BEDTIME     minoxidil (LONITEN) 2.5 MG tablet Take 1 tablet (2.5 mg total) by mouth daily. 30 tablet 0   Multiple Vitamins-Minerals (CENTRUM SILVER PO) Take 1 tablet by mouth daily.      nitroGLYCERIN (NITROSTAT) 0.4 MG SL tablet Place 0.4 mg under the tongue every 5 (five) minutes as needed for chest pain. Reported on 05/28/2016     pantoprazole (PROTONIX) 40 MG tablet TAKE 1 TABLET BY MOUTH EVERY DAY 90 tablet 3   Polyethyl Glycol-Propyl Glycol (SYSTANE FREE OP) Place 1 drop into both eyes daily as needed (dry eyes).     vitamin B-12 (CYANOCOBALAMIN) 1000 MCG tablet Take 500 mcg by mouth every other day.      No facility-administered medications prior to visit.    ROS: Review of Systems  Constitutional:  Positive for fatigue. Negative for activity change, appetite change, chills and unexpected weight change.  HENT:  Negative for congestion, mouth sores and sinus pressure.   Eyes:  Negative for visual disturbance.  Respiratory:  Positive for shortness of breath. Negative for cough and chest tightness.   Gastrointestinal:  Negative for abdominal pain and nausea.  Genitourinary:  Negative for difficulty urinating,  frequency and vaginal pain.  Musculoskeletal:  Negative for back pain and gait problem.  Skin:  Negative for pallor and rash.  Neurological:  Negative for dizziness, tremors, weakness, numbness and headaches.  Psychiatric/Behavioral:  Negative for confusion and sleep disturbance.    Objective:  BP (!) 132/48 (BP Location: Left Arm)    Pulse 66    Temp 98.1 F (36.7 C) (Oral)    Ht 5\' 3"  (1.6 m)    Wt 209 lb (94.8 kg)    SpO2 97%    BMI 37.02 kg/m   BP Readings from Last 3 Encounters:  11/05/21 (!) 132/48  10/08/21 (!) 140/52  10/02/21 (!) 123/32    Wt Readings from Last 3 Encounters:  11/05/21 209 lb (94.8 kg)  10/08/21 210 lb (95.3 kg)  10/02/21 204 lb 9.6 oz (92.8 kg)    Physical Exam Constitutional:      General: She is not in acute distress.    Appearance: She is well-developed. She is obese.  HENT:     Head: Normocephalic.     Right Ear: External ear normal.     Left Ear: External ear normal.     Nose: Nose normal.  Eyes:     General:        Right eye: No discharge.        Left eye: No discharge.     Conjunctiva/sclera: Conjunctivae normal.     Pupils: Pupils are equal, round, and reactive to light.  Neck:     Thyroid: No thyromegaly.     Vascular: No JVD.     Trachea: No tracheal deviation.  Cardiovascular:     Rate and Rhythm: Normal rate and regular rhythm.     Heart sounds: Normal heart sounds.  Pulmonary:     Effort: No respiratory distress.     Breath sounds: No stridor. No wheezing.  Abdominal:     General: Bowel sounds are normal. There is no distension.     Palpations: Abdomen is soft. There is no mass.     Tenderness: There is no abdominal tenderness. There is no guarding or rebound.  Musculoskeletal:        General:  No tenderness.     Cervical back: Normal range of motion and neck supple. No rigidity.  Lymphadenopathy:     Cervical: No cervical adenopathy.  Skin:    Findings: No erythema or rash.  Neurological:     Mental Status: She is  oriented to person, place, and time.     Cranial Nerves: No cranial nerve deficit.     Motor: No abnormal muscle tone.     Coordination: Coordination normal.     Deep Tendon Reflexes: Reflexes normal.  Psychiatric:        Behavior: Behavior normal.        Thought Content: Thought content normal.        Judgment: Judgment normal.    Lab Results  Component Value Date   WBC 8.7 10/02/2021   HGB 11.0 (L) 10/02/2021   HCT 33.0 (L) 10/02/2021   PLT 210 10/02/2021   GLUCOSE 113 (H) 10/08/2021   CHOL 151 09/30/2021   TRIG 50 09/30/2021   HDL 38 (L) 09/30/2021   LDLDIRECT 127.9 07/14/2011   LDLCALC 103 (H) 09/30/2021   ALT 13 10/08/2021   AST 15 10/08/2021   NA 134 (L) 10/08/2021   K 4.3 10/08/2021   CL 103 10/08/2021   CREATININE 2.48 (H) 10/08/2021   BUN 41 (H) 10/08/2021   CO2 21 10/08/2021   TSH 3.27 05/06/2021   INR 0.98 06/07/2014   HGBA1C 5.7 05/06/2021   MICROALBUR 1.9 04/29/2020    DG Chest 2 View  Result Date: 09/29/2021 CLINICAL DATA:  Chest pain EXAM: CHEST - 2 VIEW COMPARISON:  Chest x-ray 06/27/2018, CT chest 06/08/2014 FINDINGS: The heart and mediastinal contours are unchanged. Aortic calcification. No focal consolidation. No pulmonary edema. No pleural effusion. No pneumothorax. No acute osseous abnormality. IMPRESSION: No active cardiopulmonary disease. Electronically Signed   By: Iven Finn M.D.   On: 09/29/2021 18:28    Assessment & Plan:   Health Maintenance  Topic Date Due   OPHTHALMOLOGY EXAM  03/25/2017   COVID-19 Vaccine (5 - Booster for Pfizer series) 05/21/2021   HEMOGLOBIN A1C  11/05/2021   FOOT EXAM  06/10/2022   TETANUS/TDAP  06/11/2031   Pneumonia Vaccine 15+ Years old  Completed   INFLUENZA VACCINE  Completed   DEXA SCAN  Completed   Hepatitis C Screening  Completed   Zoster Vaccines- Shingrix  Completed   HPV VACCINES  Aged Out     Problem List Items Addressed This Visit     Atypical chest pain    No relapse      B12  deficiency    Cont on Vit B12       Chest pain    No relapse      CKD (chronic kidney disease) stage 4, GFR 15-29 ml/min ([facility])    Hydrate well. F/u w/dr Justin Mend Monitor GFR      Hypertensive renal disease    Hydrate well      Well adult exam     We discussed age appropriate health related issues, including available/recomended screening tests and vaccinations. Labs were ordered to be later reviewed . All questions were answered. We discussed one or more of the following - seat belt use, use of sunscreen/sun exposure exercise, fall risk reduction, second hand smoke exposure, firearm use and storage, seat belt use, a need for adhering to healthy diet and exercise. Labs were ordered.  All questions were answered.  cardiac CT scan for calcium scoring offered before  No orders of the defined types were placed in this encounter.     Follow-up: No follow-ups on file.  Walker Kehr, MD

## 2021-11-05 NOTE — Assessment & Plan Note (Signed)
No relapse 

## 2021-11-05 NOTE — Assessment & Plan Note (Signed)
°  We discussed age appropriate health related issues, including available/recomended screening tests and vaccinations. Labs were ordered to be later reviewed . All questions were answered. We discussed one or more of the following - seat belt use, use of sunscreen/sun exposure exercise, fall risk reduction, second hand smoke exposure, firearm use and storage, seat belt use, a need for adhering to healthy diet and exercise. Labs were ordered.  All questions were answered.  cardiac CT scan for calcium scoring offered before

## 2021-11-05 NOTE — Assessment & Plan Note (Signed)
Cont on Vit B12 

## 2021-11-05 NOTE — Assessment & Plan Note (Addendum)
Hydrate well. F/u w/dr Justin Mend Monitor GFR Pt will do labs w/Dr Justin Mend

## 2021-11-05 NOTE — Addendum Note (Signed)
Addended by: Earnstine Regal on: 11/05/2021 09:48 AM   Modules accepted: Orders

## 2021-11-17 ENCOUNTER — Other Ambulatory Visit: Payer: Self-pay | Admitting: Internal Medicine

## 2021-11-22 ENCOUNTER — Other Ambulatory Visit: Payer: Self-pay | Admitting: Internal Medicine

## 2021-12-12 DIAGNOSIS — E785 Hyperlipidemia, unspecified: Secondary | ICD-10-CM | POA: Diagnosis not present

## 2021-12-12 DIAGNOSIS — N2581 Secondary hyperparathyroidism of renal origin: Secondary | ICD-10-CM | POA: Diagnosis not present

## 2021-12-12 DIAGNOSIS — E1122 Type 2 diabetes mellitus with diabetic chronic kidney disease: Secondary | ICD-10-CM | POA: Diagnosis not present

## 2021-12-12 DIAGNOSIS — N183 Chronic kidney disease, stage 3 unspecified: Secondary | ICD-10-CM | POA: Diagnosis not present

## 2021-12-12 DIAGNOSIS — I129 Hypertensive chronic kidney disease with stage 1 through stage 4 chronic kidney disease, or unspecified chronic kidney disease: Secondary | ICD-10-CM | POA: Diagnosis not present

## 2021-12-12 DIAGNOSIS — N189 Chronic kidney disease, unspecified: Secondary | ICD-10-CM | POA: Diagnosis not present

## 2021-12-12 DIAGNOSIS — Z8739 Personal history of other diseases of the musculoskeletal system and connective tissue: Secondary | ICD-10-CM | POA: Diagnosis not present

## 2021-12-24 ENCOUNTER — Other Ambulatory Visit: Payer: Self-pay

## 2021-12-24 ENCOUNTER — Ambulatory Visit: Payer: Medicare HMO | Admitting: Podiatry

## 2021-12-24 ENCOUNTER — Encounter: Payer: Self-pay | Admitting: Podiatry

## 2021-12-24 DIAGNOSIS — M79675 Pain in left toe(s): Secondary | ICD-10-CM

## 2021-12-24 DIAGNOSIS — B351 Tinea unguium: Secondary | ICD-10-CM

## 2021-12-24 DIAGNOSIS — M79674 Pain in right toe(s): Secondary | ICD-10-CM | POA: Diagnosis not present

## 2021-12-29 NOTE — Progress Notes (Signed)
°  Subjective:  Patient ID: Bridget Oliver, female    DOB: 03/22/1941,  MRN: [id number]  Bridget Oliver presents to clinic today for painful elongated mycotic toenails 1-5 bilaterally which are tender when wearing enclosed shoe gear. Pain is relieved with periodic professional debridement..  New problem(s): None.   PCP is [name], [name], MD , and last visit was 11/05/2021.  Allergies  Allergen Reactions   Oxycodone     Nausea Can take codein    Review of Systems: Negative except as noted in the HPI. Objective:   Constitutional Bridget Oliver is a pleasant 81 y.o. African American female, in NAD. AAO x 3.   Vascular CFT immediate b/l LE. Palpable DP/PT pulses b/l LE. Digital hair sparse b/l. Skin temperature gradient WNL b/l. No pain with calf compression b/l. No edema noted b/l. No cyanosis or clubbing noted b/l LE.  Neurologic Normal speech. Oriented to person, place, and time. Protective sensation intact 5/5 intact bilaterally with 10g monofilament b/l.  Dermatologic Pedal integument with normal turgor, texture and tone b/l LE. No open wounds b/l. No interdigital macerations b/l. Toenails 2-5 bilaterally and L hallux elongated, thickened, discolored with subungual debris. +Tenderness with dorsal palpation of nailplates. No hyperkeratotic or porokeratotic lesions present.  Orthopedic: Normal muscle strength 5/5 to all lower extremity muscle groups bilaterally. HAV with bunion deformity noted b/l LE. Hammertoe(s) noted to the L 2nd toe.Marland Kitchen No pain, crepitus or joint limitation noted with ROM b/l LE.  Patient ambulates independently without assistive aids.   Radiographs: None  Last A1c:  Hemoglobin A1C Latest Ref Rng & Units 05/06/2021  HGBA1C 4.6 - 6.5 % 5.7  Some recent data might be hidden   Assessment:   1. Pain due to onychomycosis of toenails of both feet    Plan:  Patient was evaluated and treated and all questions answered. Consent given for treatment as described  below: -Mycotic toenails 2-5 bilaterally and L hallux were debrided in length and girth with sterile nail nippers and dremel without iatrogenic bleeding. -Patient/POA to call should there be question/concern in the interim.  Return in about 3 months (around 03/23/2022).  Marzetta Board, DPM

## 2022-01-16 DIAGNOSIS — I10 Essential (primary) hypertension: Secondary | ICD-10-CM | POA: Diagnosis not present

## 2022-01-16 DIAGNOSIS — Z809 Family history of malignant neoplasm, unspecified: Secondary | ICD-10-CM | POA: Diagnosis not present

## 2022-01-16 DIAGNOSIS — E785 Hyperlipidemia, unspecified: Secondary | ICD-10-CM | POA: Diagnosis not present

## 2022-01-16 DIAGNOSIS — Z6836 Body mass index (BMI) 36.0-36.9, adult: Secondary | ICD-10-CM | POA: Diagnosis not present

## 2022-01-16 DIAGNOSIS — Z823 Family history of stroke: Secondary | ICD-10-CM | POA: Diagnosis not present

## 2022-01-16 DIAGNOSIS — Z8249 Family history of ischemic heart disease and other diseases of the circulatory system: Secondary | ICD-10-CM | POA: Diagnosis not present

## 2022-01-16 DIAGNOSIS — K219 Gastro-esophageal reflux disease without esophagitis: Secondary | ICD-10-CM | POA: Diagnosis not present

## 2022-01-16 DIAGNOSIS — Z7982 Long term (current) use of aspirin: Secondary | ICD-10-CM | POA: Diagnosis not present

## 2022-01-16 DIAGNOSIS — Z008 Encounter for other general examination: Secondary | ICD-10-CM | POA: Diagnosis not present

## 2022-01-16 DIAGNOSIS — M109 Gout, unspecified: Secondary | ICD-10-CM | POA: Diagnosis not present

## 2022-01-16 DIAGNOSIS — R600 Localized edema: Secondary | ICD-10-CM | POA: Diagnosis not present

## 2022-01-16 DIAGNOSIS — I739 Peripheral vascular disease, unspecified: Secondary | ICD-10-CM | POA: Diagnosis not present

## 2022-01-19 ENCOUNTER — Ambulatory Visit (INDEPENDENT_AMBULATORY_CARE_PROVIDER_SITE_OTHER): Payer: Medicare HMO | Admitting: Internal Medicine

## 2022-01-19 ENCOUNTER — Encounter: Payer: Self-pay | Admitting: Internal Medicine

## 2022-01-19 ENCOUNTER — Other Ambulatory Visit: Payer: Self-pay

## 2022-01-19 DIAGNOSIS — I739 Peripheral vascular disease, unspecified: Secondary | ICD-10-CM | POA: Insufficient documentation

## 2022-01-19 NOTE — Progress Notes (Signed)
Subjective:  Patient ID: Bridget Oliver, female    DOB: 05/27/1941  Age: 81 y.o. MRN: [id number]  CC: No chief complaint on file.   HPI AIRA SALLADE presents for probable LE PVD - abn PAD screening at home   Outpatient Medications Prior to Visit  Medication Sig Dispense Refill   acetaminophen (TYLENOL) 325 MG tablet Take 2 tablets (650 mg total) by mouth every 6 (six) hours as needed for moderate pain. 30 tablet 0   allopurinol (ZYLOPRIM) 100 MG tablet Take 0.5 tablets (50 mg total) by mouth daily. 45 tablet 3   amLODipine (NORVASC) 10 MG tablet Take 1 tablet (10 mg total) by mouth daily. 90 tablet 3   aspirin EC 81 MG EC tablet Take 1 tablet (81 mg total) by mouth daily. Swallow whole. 30 tablet 11   atorvastatin (LIPITOR) 40 MG tablet TAKE 1 TABLET BY MOUTH EVERY DAY 90 tablet 2   carvedilol (COREG) 25 MG tablet Take 1 tablet (25 mg total) by mouth 2 (two) times daily with a meal. 180 tablet 3   Cholecalciferol (VITAMIN D3) 2000 UNITS capsule Take 1 capsule (2,000 Units total) by mouth daily. 100 capsule 3   Coenzyme Q10 (COQ10) 100 MG CAPS Take 1 capsule by mouth daily.     Fish Oil-Cholecalciferol (FISH OIL + D3) 1200-1000 MG-UNIT CAPS Take 1 capsule by mouth daily.     furosemide (LASIX) 80 MG tablet TAKE 1 TABLET BY MOUTH EVERY DAY 90 tablet 3   hydrALAZINE (APRESOLINE) 25 MG tablet Take 25 mg by mouth 3 (three) times daily.     hydrALAZINE (APRESOLINE) 50 MG tablet Take 50 mg by mouth 3 (three) times daily.     hydrocortisone 2.5 % cream Apply topically 2 (two) times daily. 30 g 0   KLOR-CON M20 20 MEQ tablet TAKE 1 TABLET BY MOUTH EVERY DAY 90 tablet 3   labetalol (NORMODYNE) 200 MG tablet      losartan (COZAAR) 100 MG tablet TAKE 1 TABLET BY MOUTH EVERYDAY AT BEDTIME 90 tablet 3   minoxidil (LONITEN) 2.5 MG tablet Take 1 tablet (2.5 mg total) by mouth daily. 90 tablet 3   Multiple Vitamins-Minerals (CENTRUM SILVER PO) Take 1 tablet by mouth daily.      nitroGLYCERIN (NITROSTAT)  0.4 MG SL tablet Place 0.4 mg under the tongue every 5 (five) minutes as needed for chest pain. Reported on 05/28/2016     pantoprazole (PROTONIX) 40 MG tablet TAKE 1 TABLET BY MOUTH EVERY DAY 90 tablet 3   Polyethyl Glycol-Propyl Glycol (SYSTANE FREE OP) Place 1 drop into both eyes daily as needed (dry eyes).     tiZANidine (ZANAFLEX) 4 MG capsule      vitamin B-12 (CYANOCOBALAMIN) 1000 MCG tablet Take 500 mcg by mouth every other day.      No facility-administered medications prior to visit.    ROS: Review of Systems  Musculoskeletal:  Positive for back pain, gait problem and myalgias.   Objective:  BP 138/62 (BP Location: Left Arm, Patient Position: Sitting, Cuff Size: Large)    Pulse 78    Temp 98.7 F (37.1 C) (Oral)    Ht 5\' 3"  (1.6 m)    Wt 209 lb (94.8 kg)    SpO2 93%    BMI 37.02 kg/m   BP Readings from Last 3 Encounters:  01/19/22 138/62  11/05/21 (!) 132/48  10/08/21 (!) 140/52    Wt Readings from Last 3 Encounters:  01/19/22 209 lb (94.8  kg)  11/05/21 209 lb (94.8 kg)  10/08/21 210 lb (95.3 kg)    Physical Exam Constitutional:      General: She is not in acute distress.    Appearance: She is well-developed. She is obese.  HENT:     Head: Normocephalic.     Right Ear: External ear normal.     Left Ear: External ear normal.     Nose: Nose normal.  Eyes:     General:        Right eye: No discharge.        Left eye: No discharge.     Conjunctiva/sclera: Conjunctivae normal.     Pupils: Pupils are equal, round, and reactive to light.  Neck:     Thyroid: No thyromegaly.     Vascular: No JVD.     Trachea: No tracheal deviation.  Cardiovascular:     Rate and Rhythm: Normal rate and regular rhythm.     Heart sounds: Normal heart sounds.  Pulmonary:     Effort: No respiratory distress.     Breath sounds: No stridor. No wheezing.  Abdominal:     General: Bowel sounds are normal. There is no distension.     Palpations: Abdomen is soft. There is no mass.      Tenderness: There is no abdominal tenderness. There is no guarding or rebound.  Musculoskeletal:        General: Swelling present. No tenderness.     Cervical back: Normal range of motion and neck supple. No rigidity.  Lymphadenopathy:     Cervical: No cervical adenopathy.  Skin:    Findings: No erythema or rash.  Neurological:     Cranial Nerves: No cranial nerve deficit.     Motor: No abnormal muscle tone.     Coordination: Coordination normal.     Deep Tendon Reflexes: Reflexes normal.  Psychiatric:        Behavior: Behavior normal.        Thought Content: Thought content normal.        Judgment: Judgment normal.  Pedal pulses are [facility]  Lab Results  Component Value Date   WBC 8.7 10/02/2021   HGB 11.0 (L) 10/02/2021   HCT 33.0 (L) 10/02/2021   PLT 210 10/02/2021   GLUCOSE 113 (H) 10/08/2021   CHOL 151 09/30/2021   TRIG 50 09/30/2021   HDL 38 (L) 09/30/2021   LDLDIRECT 127.9 07/14/2011   LDLCALC 103 (H) 09/30/2021   ALT 13 10/08/2021   AST 15 10/08/2021   NA 134 (L) 10/08/2021   K 4.3 10/08/2021   CL 103 10/08/2021   CREATININE 2.48 (H) 10/08/2021   BUN 41 (H) 10/08/2021   CO2 21 10/08/2021   TSH 3.27 05/06/2021   INR 0.98 06/07/2014   HGBA1C 5.7 05/06/2021   MICROALBUR 1.9 04/29/2020    DG Chest 2 View  Result Date: 09/29/2021 CLINICAL DATA:  Chest pain EXAM: CHEST - 2 VIEW COMPARISON:  Chest x-ray 06/27/2018, CT chest 06/08/2014 FINDINGS: The heart and mediastinal contours are unchanged. Aortic calcification. No focal consolidation. No pulmonary edema. No pleural effusion. No pneumothorax. No acute osseous abnormality. IMPRESSION: No active cardiopulmonary disease. Electronically Signed   By: Iven Finn M.D.   On: 09/29/2021 18:28    Assessment & Plan:   Problem List Items Addressed This Visit     Claudication [facility])    Recent abn PAD screening at home Will order vasc Doppler      Relevant Medications   tiZANidine (  ZANAFLEX) 4 MG capsule   Other  Relevant Orders   VAS [place] LOWER EXT ART SEG MULTI (SEGMENTALS & LE RAYNAUDS)      No orders of the defined types were placed in this encounter.     Follow-up: Return in about 6 weeks (around 03/02/2022) for a follow-up visit.  Walker Kehr, MD

## 2022-01-19 NOTE — Assessment & Plan Note (Signed)
Recent abn PAD screening at home Will order vasc Doppler

## 2022-01-21 DIAGNOSIS — H26493 Other secondary cataract, bilateral: Secondary | ICD-10-CM | POA: Diagnosis not present

## 2022-01-21 DIAGNOSIS — H524 Presbyopia: Secondary | ICD-10-CM | POA: Diagnosis not present

## 2022-01-21 DIAGNOSIS — H40023 Open angle with borderline findings, high risk, bilateral: Secondary | ICD-10-CM | POA: Diagnosis not present

## 2022-01-21 DIAGNOSIS — H35343 Macular cyst, hole, or pseudohole, bilateral: Secondary | ICD-10-CM | POA: Diagnosis not present

## 2022-01-21 DIAGNOSIS — H04123 Dry eye syndrome of bilateral lacrimal glands: Secondary | ICD-10-CM | POA: Diagnosis not present

## 2022-01-21 LAB — HM DIABETES EYE EXAM

## 2022-01-29 ENCOUNTER — Ambulatory Visit (HOSPITAL_COMMUNITY)
Admission: RE | Admit: 2022-01-29 | Discharge: 2022-01-29 | Disposition: A | Discharge status: Home / Self Care | Payer: Medicare HMO | Source: Ambulatory Visit | Attending: Internal Medicine | Admitting: Internal Medicine

## 2022-01-29 ENCOUNTER — Other Ambulatory Visit: Payer: Self-pay

## 2022-01-29 DIAGNOSIS — I739 Peripheral vascular disease, unspecified: Secondary | ICD-10-CM

## 2022-01-30 ENCOUNTER — Other Ambulatory Visit: Payer: Self-pay | Admitting: Internal Medicine

## 2022-01-30 DIAGNOSIS — I119 Hypertensive heart disease without heart failure: Secondary | ICD-10-CM

## 2022-01-30 DIAGNOSIS — I739 Peripheral vascular disease, unspecified: Secondary | ICD-10-CM

## 2022-01-30 DIAGNOSIS — E785 Hyperlipidemia, unspecified: Secondary | ICD-10-CM

## 2022-02-09 ENCOUNTER — Other Ambulatory Visit: Payer: Self-pay | Admitting: Internal Medicine

## 2022-02-13 ENCOUNTER — Other Ambulatory Visit: Payer: Self-pay

## 2022-02-13 ENCOUNTER — Encounter: Payer: Self-pay | Admitting: Internal Medicine

## 2022-02-13 ENCOUNTER — Ambulatory Visit: Payer: Medicare HMO | Admitting: Internal Medicine

## 2022-02-13 VITALS — BP 92/40 | HR 65 | Ht 63.0 in | Wt 202.6 lb

## 2022-02-13 DIAGNOSIS — R55 Syncope and collapse: Secondary | ICD-10-CM | POA: Diagnosis not present

## 2022-02-13 DIAGNOSIS — R42 Dizziness and giddiness: Secondary | ICD-10-CM

## 2022-02-13 DIAGNOSIS — E785 Hyperlipidemia, unspecified: Secondary | ICD-10-CM

## 2022-02-13 DIAGNOSIS — I951 Orthostatic hypotension: Secondary | ICD-10-CM

## 2022-02-13 DIAGNOSIS — I739 Peripheral vascular disease, unspecified: Secondary | ICD-10-CM | POA: Diagnosis not present

## 2022-02-13 MED ORDER — EZETIMIBE 10 MG PO TABS: 10.0000 mg | ORAL_TABLET | Freq: Every day | ORAL | 3 refills | Status: DC

## 2022-02-13 MED ORDER — HYDRALAZINE HCL 25 MG PO TABS: 25.0000 mg | ORAL_TABLET | Freq: Three times a day (TID) | ORAL | 3 refills | Status: DC

## 2022-02-13 NOTE — Progress Notes (Signed)
? ?OFFICE CONSULT NOTE ? ?Chief Complaint:  ?Re-establish care ? ?Primary Care Physician: ?[name], [name], MD ? ?HPI:  ?Bridget Oliver is a 81 y.o. female who is being seen today for the evaluation of chest pain at the request of [name], [name], MD. This is a 81 year old female who previously saw Dr. Marlou Porch in the hospital in 2015 for chest pain.  This was felt to be atypical at the time and she underwent Lexiscan Myoview stress testing which was negative for ischemia.  Recently she describes chest pain which is at rest.  She says is worse laying down or being in bed but not associated with exertion.  She said she could take aspirin with complete resolution of her symptoms.  It was also felt that she might have reflux and she was started on Protonix.  She says she has had no further episodes since that was started a few months ago.  She also struggles from gout and has high blood pressure which is primarily on her mother side of the family as well as dyslipidemia on statin.  Family history is also significant for lung and pancreatic cancer.  Currently she says her chest pain symptoms have completely resolved. ? ?02/13/2022 ? ?Bridget Oliver is seen today to re-establish care.  I last saw her in 2015 at which time she had had chest pain which is felt to be atypical for ischemia.  She underwent Lexiscan Myoview stress testing which was negative.  She has had issues with difficult to control hypertension.  She was admitted in November with hypertensive emergency and had stress testing at that time which was negative for ischemia.  Subsequently she had developed symptoms of claudication and underwent lower extremity arterial Dopplers which indicated moderate right and lower extremity arterial disease.  She does say that she can walk about 200 feet and then gets pain in her legs which improves after she rests.  She is on aspirin and a statin.  Her last lipid profile showed total cholesterol 151, triglycerides 50, HDL  38 and LDL 103 on atorvastatin 40 mg daily.  She also takes quite a few medicines for hypertension including amlodipine 10 mg daily, carvedilol 25 mg twice daily, hydralazine 75 mg 3 times daily, losartan 100 mg daily and minoxidil 2.5 mg daily.  Despite this, blood pressure in the office today actually was quite low in fact 2 manual blood pressures were measured at 92/40.  I personally took the blood pressure it was 100/40 and the Dinamap blood pressure was 103/52.  She reported lightheadedness and while examining her had a near syncopal episode. ? ?PMHx:  ?Past Medical History:  ?Diagnosis Date  ? CRI (chronic renal insufficiency)   ? Stage 3  ? Diabetes mellitus 2011  ? Type II diet controlled  ? Diarrhea   ? GERD (gastroesophageal reflux disease)   ? Gout   ? Hyperglycemia   ? Hyperlipidemia   ? Hypertension   ? Lactose intolerance   ? Macular degeneration   ? Obesity   ? Swelling of lower limb   ? ? ?Past Surgical History:  ?Procedure Laterality Date  ? ABDOMINAL HYSTERECTOMY  1985  ? APPENDECTOMY  1985  ? CATARACT EXTRACTION  2010 & 2011  ? bilateral  ? [place]  ? LUMBAR LAMINECTOMY  2007  ? PARS PLANA VITRECTOMY W/ REPAIR OF MACULAR HOLE  2010 & 2011  ? Bilateral  ? ? ?FAMHx:  ?Family History  ?Problem Relation Age of  Onset  ? Hypertension Mother   ? Hypertension Father   ? Cancer Other   ?     Lung  ? Colon cancer Neg Hx   ? Esophageal cancer Neg Hx   ? Stomach cancer Neg Hx   ? Rectal cancer Neg Hx   ? ? ?SOCHx:  ? reports that she has never smoked. She has never used smokeless tobacco. She reports current alcohol use. She reports that she does not use drugs. ? ?ALLERGIES:  ?Allergies  ?Allergen Reactions  ? Oxycodone   ?  Nausea ?Can take codein  ? ? ?ROS: ?Pertinent items noted in HPI and remainder of comprehensive ROS otherwise negative. ? ?HOME MEDS: ?Current Outpatient Medications on File Prior to Visit  ?Medication Sig Dispense Refill  ? allopurinol (ZYLOPRIM) 100 MG tablet Take 0.5  tablets (50 mg total) by mouth daily. 45 tablet 3  ? amLODipine (NORVASC) 10 MG tablet Take 1 tablet (10 mg total) by mouth daily. 90 tablet 3  ? aspirin EC 81 MG EC tablet Take 1 tablet (81 mg total) by mouth daily. Swallow whole. 30 tablet 11  ? atorvastatin (LIPITOR) 40 MG tablet TAKE 1 TABLET BY MOUTH EVERY DAY 90 tablet 2  ? carvedilol (COREG) 25 MG tablet Take 1 tablet (25 mg total) by mouth 2 (two) times daily with a meal. 180 tablet 3  ? Cholecalciferol (VITAMIN D3) 2000 UNITS capsule Take 1 capsule (2,000 Units total) by mouth daily. 100 capsule 3  ? Coenzyme Q10 (COQ10) 100 MG CAPS Take 1 capsule by mouth daily.    ? Fish Oil-Cholecalciferol (FISH OIL + D3) 1200-1000 MG-UNIT CAPS Take 1 capsule by mouth daily.    ? furosemide (LASIX) 80 MG tablet TAKE 1 TABLET BY MOUTH EVERY DAY 90 tablet 3  ? hydrALAZINE (APRESOLINE) 25 MG tablet Take 25 mg by mouth 3 (three) times daily.    ? hydrALAZINE (APRESOLINE) 50 MG tablet Take 50 mg by mouth 3 (three) times daily.    ? KLOR-CON M20 20 MEQ tablet TAKE 1 TABLET BY MOUTH EVERY DAY 90 tablet 3  ? losartan (COZAAR) 100 MG tablet TAKE 1 TABLET BY MOUTH EVERYDAY AT BEDTIME 90 tablet 3  ? minoxidil (LONITEN) 2.5 MG tablet Take 1 tablet (2.5 mg total) by mouth daily. 90 tablet 3  ? Multiple Vitamins-Minerals (CENTRUM SILVER PO) Take 1 tablet by mouth daily.     ? pantoprazole (PROTONIX) 40 MG tablet TAKE 1 TABLET BY MOUTH EVERY DAY 90 tablet 3  ? Polyethyl Glycol-Propyl Glycol (SYSTANE FREE OP) Place 1 drop into both eyes daily as needed (dry eyes).    ? vitamin B-12 (CYANOCOBALAMIN) 1000 MCG tablet Take 500 mcg by mouth every other day.     ? acetaminophen (TYLENOL) 325 MG tablet Take 2 tablets (650 mg total) by mouth every 6 (six) hours as needed for moderate pain. (Patient not taking: Reported on 02/13/2022) 30 tablet 0  ? hydrocortisone 2.5 % cream Apply topically 2 (two) times daily. (Patient not taking: Reported on 02/13/2022) 30 g 0  ? labetalol (NORMODYNE) 200 MG  tablet  (Patient not taking: Reported on 02/13/2022)    ? nitroGLYCERIN (NITROSTAT) 0.4 MG SL tablet Place 0.4 mg under the tongue every 5 (five) minutes as needed for chest pain. Reported on 05/28/2016 (Patient not taking: Reported on 02/13/2022)    ? tiZANidine (ZANAFLEX) 4 MG capsule  (Patient not taking: Reported on 02/13/2022)    ? ?No current facility-administered medications on file prior  to visit.  ? ? ?LABS/IMAGING: ?No results found for this or any previous visit (from the past 48 hour(s)). ?No results found. ? ?LIPID PANEL: ?   ?Component Value Date/Time  ? CHOL 151 09/30/2021 0655  ? TRIG 50 09/30/2021 0655  ? HDL 38 (L) 09/30/2021 0655  ? CHOLHDL 4.0 09/30/2021 0655  ? VLDL 10 09/30/2021 0655  ? [place] 103 (H) 09/30/2021 3295  ? LDLDIRECT 127.9 07/14/2011 0747  ? ? ?WEIGHTS: ?Wt Readings from Last 3 Encounters:  ?02/13/22 202 lb 9.6 oz (91.9 kg)  ?01/19/22 209 lb (94.8 kg)  ?11/05/21 209 lb (94.8 kg)  ? ? ?VITALS: ?BP (!) 92/40 Comment: 103 52 With dynomap  Pulse 65   Ht '5\' 3"'$  (1.6 m)   Wt 202 lb 9.6 oz (91.9 kg)   SpO2 96%   BMI 35.89 kg/m?  ? ?EXAM: ?General appearance: alert, no distress and morbidly obese ?Neck: no carotid bruit, no JVD and thyroid not enlarged, symmetric, no tenderness/mass/nodules ?Lungs: clear to auscultation bilaterally ?Heart: regular rate and rhythm ?Abdomen: soft, non-tender; bowel sounds normal; no masses,  no organomegaly ?Extremities: extremities normal, atraumatic, no cyanosis or edema ?Pulses: 2+ and symmetric ?Skin: Skin color, texture, turgor normal. No rashes or lesions ?Neurologic: Grossly normal ?Psych: Pleasant ? ?EKG: ?Normal sinus rhythm at 65, nonspecific ST changes-personally reviewed ? 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 ?PLAN: ?1.    Bridget Oliver has had difficult to control hypertension and is on numerous medications but is noted to be hypotensive and symptomatic today.  She was near syncopal in the office.  I advised that we decrease her hydr

## 2022-02-13 NOTE — Patient Instructions (Signed)
Medication Instructions:  ?DECREASE hydralazine to '25mg'$  three times daily  ? ?START zetia '10mg'$  daily for cholesterol  ? ?*If you need a refill on your cardiac medications before your next appointment, please call your pharmacy* ? ? ?Lab Work: ?FASTING lipid panel in 3-4 months ?-- complete before next visit with Dr. Debara Pickett  ? ?If you have labs (blood work) drawn today and your tests are completely normal, you will receive your results only by: ?MyChart Message (if you have MyChart) OR ?A paper copy in the mail ?If you have any lab test that is abnormal or we need to change your treatment, we will call you to review the results. ? ? ?Testing/Procedures: ?NONE ? ? ?Follow-Up: ?At [facility], you and your health needs are our priority.  As part of our continuing mission to provide you with exceptional heart care, we have created designated Provider Care Teams.  These Care Teams include your primary Cardiologist (physician) and Advanced Practice Providers (APPs -  Physician Assistants and Nurse Practitioners) who all work together to provide you with the care you need, when you need it. ? ?We recommend signing up for the patient portal called "MyChart".  Sign up information is provided on this After Visit Summary.  MyChart is used to connect with patients for Virtual Visits (Telemedicine).  Patients are able to view lab/test results, encounter notes, upcoming appointments, etc.  Non-urgent messages can be sent to your provider as well.   ?To learn more about what you can do with MyChart, go to [email or web address].   ? ?Your next appointment:   ?3-4 month(s) ? ?The format for your next appointment:   ?In Person ? ?Provider:   ?Dr. Lyman Bishop ? ?

## 2022-02-16 NOTE — Addendum Note (Signed)
Addended by: Fidel Levy on: 02/16/2022 02:59 PM ? ? Modules accepted: Orders ? ?

## 2022-03-02 ENCOUNTER — Ambulatory Visit (INDEPENDENT_AMBULATORY_CARE_PROVIDER_SITE_OTHER): Payer: Medicare HMO | Admitting: Internal Medicine

## 2022-03-02 ENCOUNTER — Encounter: Payer: Self-pay | Admitting: Internal Medicine

## 2022-03-02 ENCOUNTER — Ambulatory Visit (INDEPENDENT_AMBULATORY_CARE_PROVIDER_SITE_OTHER): Payer: Medicare HMO

## 2022-03-02 DIAGNOSIS — M109 Gout, unspecified: Secondary | ICD-10-CM | POA: Diagnosis not present

## 2022-03-02 DIAGNOSIS — R1030 Lower abdominal pain, unspecified: Secondary | ICD-10-CM | POA: Diagnosis not present

## 2022-03-02 DIAGNOSIS — E1122 Type 2 diabetes mellitus with diabetic chronic kidney disease: Secondary | ICD-10-CM | POA: Diagnosis not present

## 2022-03-02 DIAGNOSIS — I119 Hypertensive heart disease without heart failure: Secondary | ICD-10-CM | POA: Diagnosis not present

## 2022-03-02 DIAGNOSIS — E538 Deficiency of other specified B group vitamins: Secondary | ICD-10-CM

## 2022-03-02 DIAGNOSIS — N1831 Chronic kidney disease, stage 3a: Secondary | ICD-10-CM | POA: Diagnosis not present

## 2022-03-02 LAB — URINALYSIS, ROUTINE W REFLEX MICROSCOPIC
Bilirubin Urine: NEGATIVE
Hgb urine dipstick: NEGATIVE
Ketones, ur: NEGATIVE
Nitrite: NEGATIVE
RBC / HPF: NONE SEEN (ref 0–?)
Specific Gravity, Urine: 1.01 (ref 1.000–1.030)
Total Protein, Urine: NEGATIVE
Urine Glucose: NEGATIVE
Urobilinogen, UA: 0.2 (ref 0.0–1.0)
pH: 6 (ref 5.0–8.0)

## 2022-03-02 LAB — COMPREHENSIVE METABOLIC PANEL
ALT: 12 U/L (ref 0–35)
AST: 15 U/L (ref 0–37)
Albumin: 3.8 g/dL (ref 3.5–5.2)
Alkaline Phosphatase: 62 U/L (ref 39–117)
BUN: 32 mg/dL — ABNORMAL HIGH (ref 6–23)
CO2: 25 mEq/L (ref 19–32)
Calcium: 9.4 mg/dL (ref 8.4–10.5)
Chloride: 106 mEq/L (ref 96–112)
Creatinine, Ser: 1.72 mg/dL — ABNORMAL HIGH (ref 0.40–1.20)
GFR: 27.89 mL/min — ABNORMAL LOW (ref >60.00)
Glucose, Bld: 97 mg/dL (ref 70–99)
Potassium: 4.3 mEq/L (ref 3.5–5.1)
Sodium: 140 mEq/L (ref 135–145)
Total Bilirubin: 0.4 mg/dL (ref 0.2–1.2)
Total Protein: 6.7 g/dL (ref 6.0–8.3)

## 2022-03-02 LAB — CBC WITH DIFFERENTIAL/PLATELET
Basophils Absolute: 0.1 10*3/uL (ref 0.0–0.1)
Basophils Relative: 1.1 % (ref 0.0–3.0)
Eosinophils Absolute: 0.1 10*3/uL (ref 0.0–0.7)
Eosinophils Relative: 2.7 % (ref 0.0–5.0)
HCT: 31.3 % — ABNORMAL LOW (ref 36.0–46.0)
Hemoglobin: 10.3 g/dL — ABNORMAL LOW (ref 12.0–15.0)
Lymphocytes Relative: 27.3 % (ref 12.0–46.0)
Lymphs Abs: 1.5 10*3/uL (ref 0.7–4.0)
MCHC: 32.9 g/dL (ref 30.0–36.0)
MCV: 85 fl (ref 78.0–100.0)
Monocytes Absolute: 0.5 10*3/uL (ref 0.1–1.0)
Monocytes Relative: 10 % (ref 3.0–12.0)
Neutro Abs: 3.2 10*3/uL (ref 1.4–7.7)
Neutrophils Relative %: 58.9 % (ref 43.0–77.0)
Platelets: 187 10*3/uL (ref 150.0–400.0)
RBC: 3.69 Mil/uL — ABNORMAL LOW (ref 3.87–5.11)
RDW: 14.5 % (ref 11.5–15.5)
WBC: 5.4 10*3/uL (ref 4.0–10.5)

## 2022-03-02 LAB — SEDIMENTATION RATE: Sed Rate: 45 mm/hr — ABNORMAL HIGH (ref 0–30)

## 2022-03-02 LAB — HEMOGLOBIN A1C: Hgb A1c MFr Bld: 5.3 % (ref 4.6–6.5)

## 2022-03-02 MED ORDER — ALIGN 4 MG PO CAPS: 1.0000 | ORAL_CAPSULE | Freq: Every day | ORAL | 0 refills | Status: AC

## 2022-03-02 MED ORDER — METRONIDAZOLE 500 MG PO TABS: 500.0000 mg | ORAL_TABLET | Freq: Three times a day (TID) | ORAL | 0 refills | Status: DC

## 2022-03-02 MED ORDER — CIPROFLOXACIN HCL 500 MG PO TABS: 500.0000 mg | ORAL_TABLET | Freq: Two times a day (BID) | ORAL | 0 refills | Status: AC

## 2022-03-02 NOTE — Assessment & Plan Note (Signed)
No gout attacks

## 2022-03-02 NOTE — Progress Notes (Signed)
? ?Subjective:  ?Patient ID: Bridget Oliver, female    DOB: 23-Jul-1941  Age: 81 y.o. MRN: [id number] ? ?CC: No chief complaint on file. ? ? ?HPI ?Bridget Oliver presents for HTN, CRI, gout ?C/o occasional abd pain - lower; better after BMs; x 3 - 4 months off and on; sever at times ?Last colon 2019 ? ? ?Outpatient Medications Prior to Visit  ?Medication Sig Dispense Refill  ? allopurinol (ZYLOPRIM) 100 MG tablet Take 0.5 tablets (50 mg total) by mouth daily. 45 tablet 3  ? amLODipine (NORVASC) 10 MG tablet Take 1 tablet (10 mg total) by mouth daily. 90 tablet 3  ? aspirin EC 81 MG EC tablet Take 1 tablet (81 mg total) by mouth daily. Swallow whole. 30 tablet 11  ? atorvastatin (LIPITOR) 40 MG tablet TAKE 1 TABLET BY MOUTH EVERY DAY 90 tablet 2  ? carvedilol (COREG) 25 MG tablet Take 1 tablet (25 mg total) by mouth 2 (two) times daily with a meal. 180 tablet 3  ? Cholecalciferol (VITAMIN D3) 2000 UNITS capsule Take 1 capsule (2,000 Units total) by mouth daily. 100 capsule 3  ? Coenzyme Q10 (COQ10) 100 MG CAPS Take 1 capsule by mouth daily.    ? ezetimibe (ZETIA) 10 MG tablet Take 1 tablet (10 mg total) by mouth daily. 90 tablet 3  ? Fish Oil-Cholecalciferol (FISH OIL + D3) 1200-1000 MG-UNIT CAPS Take 1 capsule by mouth daily.    ? furosemide (LASIX) 80 MG tablet TAKE 1 TABLET BY MOUTH EVERY DAY 90 tablet 3  ? hydrALAZINE (APRESOLINE) 25 MG tablet Take 1 tablet (25 mg total) by mouth 3 (three) times daily. 270 tablet 3  ? KLOR-CON M20 20 MEQ tablet TAKE 1 TABLET BY MOUTH EVERY DAY 90 tablet 3  ? losartan (COZAAR) 100 MG tablet TAKE 1 TABLET BY MOUTH EVERYDAY AT BEDTIME 90 tablet 3  ? minoxidil (LONITEN) 2.5 MG tablet Take 1 tablet (2.5 mg total) by mouth daily. 90 tablet 3  ? Multiple Vitamins-Minerals (CENTRUM SILVER PO) Take 1 tablet by mouth daily.     ? pantoprazole (PROTONIX) 40 MG tablet TAKE 1 TABLET BY MOUTH EVERY DAY 90 tablet 3  ? Polyethyl Glycol-Propyl Glycol (SYSTANE FREE OP) Place 1 drop into both eyes daily as  needed (dry eyes).    ? vitamin B-12 (CYANOCOBALAMIN) 1000 MCG tablet Take 500 mcg by mouth every other day.     ? acetaminophen (TYLENOL) 325 MG tablet Take 2 tablets (650 mg total) by mouth every 6 (six) hours as needed for moderate pain. (Patient not taking: Reported on 02/13/2022) 30 tablet 0  ? hydrocortisone 2.5 % cream Apply topically 2 (two) times daily. (Patient not taking: Reported on 02/13/2022) 30 g 0  ? nitroGLYCERIN (NITROSTAT) 0.4 MG SL tablet Place 0.4 mg under the tongue every 5 (five) minutes as needed for chest pain. Reported on 05/28/2016 (Patient not taking: Reported on 02/13/2022)    ? tiZANidine (ZANAFLEX) 4 MG capsule  (Patient not taking: Reported on 02/13/2022)    ? labetalol (NORMODYNE) 200 MG tablet  (Patient not taking: Reported on 02/13/2022)    ? ?No facility-administered medications prior to visit.  ? ? ?ROS: ?Review of Systems  ?Constitutional:  Negative for activity change, appetite change, chills, fatigue and unexpected weight change.  ?HENT:  Negative for congestion, mouth sores and sinus pressure.   ?Eyes:  Negative for visual disturbance.  ?Respiratory:  Negative for cough and chest tightness.   ?Gastrointestinal:  Positive for  abdominal pain. Negative for nausea.  ?Genitourinary:  Negative for difficulty urinating, frequency and vaginal pain.  ?Musculoskeletal:  Positive for arthralgias. Negative for back pain and gait problem.  ?Skin:  Negative for pallor and rash.  ?Neurological:  Negative for dizziness, tremors, weakness, numbness and headaches.  ?Psychiatric/Behavioral:  Negative for confusion and sleep disturbance.   ? ?Objective:  ?BP (!) 142/50 (BP Location: Left Arm, Patient Position: Sitting, Cuff Size: Large)   Pulse 70   Temp 98 ?F (36.7 ?C) (Oral)   Ht '5\' 3"'$  (1.6 m)   Wt 200 lb (90.7 kg)   SpO2 94%   BMI 35.43 kg/m?  ? ?BP Readings from Last 3 Encounters:  ?03/02/22 (!) 142/50  ?02/13/22 (!) 92/40  ?01/19/22 138/62  ? ? ?Wt Readings from Last 3 Encounters:  ?03/02/22  200 lb (90.7 kg)  ?02/13/22 202 lb 9.6 oz (91.9 kg)  ?01/19/22 209 lb (94.8 kg)  ? ? ?Physical Exam ?Constitutional:   ?   General: She is not in acute distress. ?   Appearance: She is well-developed.  ?HENT:  ?   Head: Normocephalic.  ?   Right Ear: External ear normal.  ?   Left Ear: External ear normal.  ?   Nose: Nose normal.  ?Eyes:  ?   General:     ?   Right eye: No discharge.     ?   Left eye: No discharge.  ?   Conjunctiva/sclera: Conjunctivae normal.  ?   Pupils: Pupils are equal, round, and reactive to light.  ?Neck:  ?   Thyroid: No thyromegaly.  ?   Vascular: No JVD.  ?   Trachea: No tracheal deviation.  ?Cardiovascular:  ?   Rate and Rhythm: Normal rate and regular rhythm.  ?   Heart sounds: Normal heart sounds.  ?Pulmonary:  ?   Effort: No respiratory distress.  ?   Breath sounds: No stridor. No wheezing.  ?Abdominal:  ?   General: Bowel sounds are normal. There is no distension.  ?   Palpations: Abdomen is soft. There is no mass.  ?   Tenderness: There is no abdominal tenderness. There is no guarding or rebound.  ?Musculoskeletal:     ?   General: No tenderness.  ?   Cervical back: Normal range of motion and neck supple. No rigidity.  ?Lymphadenopathy:  ?   Cervical: No cervical adenopathy.  ?Skin: ?   Findings: No erythema or rash.  ?Neurological:  ?   Cranial Nerves: No cranial nerve deficit.  ?   Motor: No abnormal muscle tone.  ?   Coordination: Coordination normal.  ?   Deep Tendon Reflexes: Reflexes normal.  ?Psychiatric:     ?   Behavior: Behavior normal.     ?   Thought Content: Thought content normal.     ?   Judgment: Judgment normal.  ?Obese ?Abd - NT ? ?Lab Results  ?Component Value Date  ? WBC 8.7 10/02/2021  ? HGB 11.0 (L) 10/02/2021  ? HCT 33.0 (L) 10/02/2021  ? PLT 210 10/02/2021  ? GLUCOSE 113 (H) 10/08/2021  ? CHOL 151 09/30/2021  ? TRIG 50 09/30/2021  ? HDL 38 (L) 09/30/2021  ? LDLDIRECT 127.9 07/14/2011  ? LDLCALC 103 (H) 09/30/2021  ? ALT 13 10/08/2021  ? AST 15 10/08/2021  ? NA  134 (L) 10/08/2021  ? K 4.3 10/08/2021  ? CL 103 10/08/2021  ? CREATININE 2.48 (H) 10/08/2021  ? BUN 41 (H) 10/08/2021  ?  CO2 21 10/08/2021  ? TSH 3.27 05/06/2021  ? INR 0.98 06/07/2014  ? HGBA1C 5.7 05/06/2021  ? MICROALBUR 1.9 04/29/2020  ? ? ?VAS [place] LOWER EXT ART SEG MULTI (SEGMENTALS & LE RAYNAUDS) ? ?Result Date: 01/29/2022 ? LOWER EXTREMITY DOPPLER STUDY Patient Name:  Bridget Oliver  Date of Exam:   01/29/2022 Medical Rec #: [id number]    Accession #:    [id number] Date of Birth: 21-Nov-1941    Patient Gender: F Patient Age:   27 years Exam Location:  [facility] Procedure:      VAS [place] LOWER EXT ART SEG MULTI (SEGMENTALS & LE RAYNAUDS) Referring Phys: Tyrone Apple Aailyah Dunbar --------------------------------------------------------------------------------  Indications: Claudication, and patient reports a wobbly feeling not necessarily              pain in both legs from buttocks to ankles in both legs after 5              minutes/100 yards of walking. Patient had abormal PAD home screen. High Risk Factors: Hypertension, hyperlipidemia, Diabetes, no history of                    smoking.  Comparison Study: NA Performing Technologist: Leavy Cella RDCS  Examination Guidelines: A complete evaluation includes at minimum, Doppler waveform signals and systolic blood pressure reading at the level of bilateral brachial, anterior tibial, and posterior tibial arteries, when vessel segments are accessible. Bilateral testing is considered an integral part of a complete examination. Photoelectric Plethysmograph (PPG) waveforms and toe systolic pressure readings are included as required and additional duplex testing as needed. Limited examinations for reoccurring indications may be performed as noted.  ABI Findings: +---------+------------------+-----+----------+--------+ Right    Rt Pressure (mmHg)IndexWaveform  Comment  +---------+------------------+-----+----------+--------+ Brachial 132                                        +---------+------------------+-----+----------+--------+ CFA                             monophasic         +---------+------------------+-----+----------+--------+ Popliteal                       monophasic

## 2022-03-02 NOTE — Assessment & Plan Note (Signed)
Cont on Hydralazine, Furosemide, Losartan, Coreg ?

## 2022-03-02 NOTE — Assessment & Plan Note (Signed)
Cont on Vit B12 

## 2022-03-02 NOTE — Assessment & Plan Note (Addendum)
Lower abd pain - ?diverticulitis vs other. No pain now ?H/o appendectomy ?X ray abd ?Check UA, CBC, ESR ?Call if relapsed ?Flagyl and Cipro if relapsed; Probiotic ?

## 2022-03-02 NOTE — Assessment & Plan Note (Signed)
Monitoring A1c 

## 2022-03-08 ENCOUNTER — Other Ambulatory Visit: Payer: Self-pay | Admitting: Internal Medicine

## 2022-03-25 ENCOUNTER — Encounter: Payer: Self-pay | Admitting: Podiatry

## 2022-03-25 ENCOUNTER — Ambulatory Visit: Payer: Medicare HMO | Admitting: Podiatry

## 2022-03-25 DIAGNOSIS — M79674 Pain in right toe(s): Secondary | ICD-10-CM

## 2022-03-25 DIAGNOSIS — M79675 Pain in left toe(s): Secondary | ICD-10-CM

## 2022-03-25 DIAGNOSIS — B351 Tinea unguium: Secondary | ICD-10-CM

## 2022-03-27 DIAGNOSIS — I129 Hypertensive chronic kidney disease with stage 1 through stage 4 chronic kidney disease, or unspecified chronic kidney disease: Secondary | ICD-10-CM | POA: Diagnosis not present

## 2022-03-27 DIAGNOSIS — N184 Chronic kidney disease, stage 4 (severe): Secondary | ICD-10-CM | POA: Diagnosis not present

## 2022-03-27 DIAGNOSIS — E1122 Type 2 diabetes mellitus with diabetic chronic kidney disease: Secondary | ICD-10-CM | POA: Diagnosis not present

## 2022-03-27 DIAGNOSIS — N183 Chronic kidney disease, stage 3 unspecified: Secondary | ICD-10-CM | POA: Diagnosis not present

## 2022-03-31 ENCOUNTER — Other Ambulatory Visit: Payer: Self-pay | Admitting: Nephrology

## 2022-03-31 DIAGNOSIS — I129 Hypertensive chronic kidney disease with stage 1 through stage 4 chronic kidney disease, or unspecified chronic kidney disease: Secondary | ICD-10-CM

## 2022-03-31 DIAGNOSIS — N184 Chronic kidney disease, stage 4 (severe): Secondary | ICD-10-CM

## 2022-04-02 NOTE — Progress Notes (Signed)
?  Subjective:  ?Patient ID: Bridget Oliver, female    DOB: 10/08/1941,  MRN: [id number] ? ?Bridget Oliver presents to clinic today for painful thick toenails that are difficult to trim. Pain interferes with ambulation. Aggravating factors include wearing enclosed shoe gear. Pain is relieved with periodic professional debridement. ? ?New problem(s): None.  ? ?PCP is [name], [name], MD , and last visit was March 02, 2022. ? ?Allergies  ?Allergen Reactions  ? Oxycodone   ?  Nausea ?Can take codein  ? ? ?Review of Systems: Negative except as noted in the HPI. ? ?Objective: No changes noted in today's physical examination. ? ?Constitutional Bridget Oliver is a pleasant 81 y.o. African American female, in NAD. AAO x 3.   ?Vascular CFT immediate b/l LE. Palpable DP/PT pulses b/l LE. Digital hair sparse b/l. Skin temperature gradient WNL b/l. No pain with calf compression b/l. No edema noted b/l. No cyanosis or clubbing noted b/l LE.  ?Neurologic Normal speech. Oriented to person, place, and time. Protective sensation intact 5/5 intact bilaterally with 10g monofilament b/l.  ?Dermatologic Pedal integument with normal turgor, texture and tone b/l LE. No open wounds b/l. No interdigital macerations b/l. Toenails 2-5 bilaterally and L hallux elongated, thickened, discolored with subungual debris. +Tenderness with dorsal palpation of nailplates. No hyperkeratotic or porokeratotic lesions present.  ?Orthopedic: Normal muscle strength 5/5 to all lower extremity muscle groups bilaterally. HAV with bunion deformity noted b/l LE. Hammertoe(s) noted to the L 2nd toe.Marland Kitchen No pain, crepitus or joint limitation noted with ROM b/l LE.  Patient ambulates independently without assistive aids.  ? ?Radiographs: None ? ?  Latest Ref Rng & Units 03/02/2022  ? 10:36 AM 05/06/2021  ?  9:01 AM  ?Hemoglobin A1C  ?Hemoglobin-A1c 4.6 - 6.5 % 5.3   5.7    ? ?Assessment/Plan: ?1. Pain due to onychomycosis of toenails of both feet   ?  ?-Patient was evaluated  and treated. All patient's and/or POA's questions/concerns answered on today's visit. ?-Patient to continue soft, supportive shoe gear daily. ?-Toenails 1-5 b/l were debrided in length and girth with sterile nail nippers and dremel without iatrogenic bleeding.  ?-Patient/POA to call should there be question/concern in the interim.  ? ?Return in about 3 months (around 06/25/2022). ? ?Marzetta Board, DPM  ?

## 2022-04-14 ENCOUNTER — Ambulatory Visit
Admission: RE | Admit: 2022-04-14 | Discharge: 2022-04-14 | Disposition: A | Discharge status: Home / Self Care | Payer: Medicare HMO | Source: Ambulatory Visit | Attending: Nephrology | Admitting: Nephrology

## 2022-04-14 DIAGNOSIS — N189 Chronic kidney disease, unspecified: Secondary | ICD-10-CM | POA: Diagnosis not present

## 2022-04-14 DIAGNOSIS — I129 Hypertensive chronic kidney disease with stage 1 through stage 4 chronic kidney disease, or unspecified chronic kidney disease: Secondary | ICD-10-CM

## 2022-04-14 DIAGNOSIS — N184 Chronic kidney disease, stage 4 (severe): Secondary | ICD-10-CM

## 2022-04-14 DIAGNOSIS — I10 Essential (primary) hypertension: Secondary | ICD-10-CM | POA: Diagnosis not present

## 2022-05-01 DIAGNOSIS — Z1231 Encounter for screening mammogram for malignant neoplasm of breast: Secondary | ICD-10-CM | POA: Diagnosis not present

## 2022-05-12 ENCOUNTER — Ambulatory Visit (INDEPENDENT_AMBULATORY_CARE_PROVIDER_SITE_OTHER): Payer: Medicare HMO | Admitting: Internal Medicine

## 2022-05-12 ENCOUNTER — Encounter: Payer: Self-pay | Admitting: Internal Medicine

## 2022-05-12 DIAGNOSIS — I119 Hypertensive heart disease without heart failure: Secondary | ICD-10-CM | POA: Diagnosis not present

## 2022-05-12 DIAGNOSIS — N184 Chronic kidney disease, stage 4 (severe): Secondary | ICD-10-CM | POA: Diagnosis not present

## 2022-05-12 DIAGNOSIS — I129 Hypertensive chronic kidney disease with stage 1 through stage 4 chronic kidney disease, or unspecified chronic kidney disease: Secondary | ICD-10-CM

## 2022-05-12 DIAGNOSIS — N1831 Chronic kidney disease, stage 3a: Secondary | ICD-10-CM

## 2022-05-12 DIAGNOSIS — E1122 Type 2 diabetes mellitus with diabetic chronic kidney disease: Secondary | ICD-10-CM

## 2022-05-12 NOTE — Progress Notes (Signed)
Subjective:  Patient ID: Bridget Oliver, female    DOB: 1941/03/31  Age: 81 y.o. MRN: [id number]  CC: No chief complaint on file.   HPI TRANIECE BOFFA presents for CRI, gout, HTN. Abd pain is much better - rare  Outpatient Medications Prior to Visit  Medication Sig Dispense Refill   allopurinol (ZYLOPRIM) 100 MG tablet Take 0.5 tablets (50 mg total) by mouth daily. 45 tablet 3   amLODipine (NORVASC) 10 MG tablet Take 1 tablet (10 mg total) by mouth daily. 90 tablet 3   aspirin EC 81 MG EC tablet Take 1 tablet (81 mg total) by mouth daily. Swallow whole. 30 tablet 11   atorvastatin (LIPITOR) 40 MG tablet TAKE 1 TABLET BY MOUTH EVERY DAY 90 tablet 2   carvedilol (COREG) 25 MG tablet Take 1 tablet (25 mg total) by mouth 2 (two) times daily with a meal. 180 tablet 3   Cholecalciferol (VITAMIN D3) 2000 UNITS capsule Take 1 capsule (2,000 Units total) by mouth daily. 100 capsule 3   Coenzyme Q10 (COQ10) 100 MG CAPS Take 1 capsule by mouth daily.     ezetimibe (ZETIA) 10 MG tablet Take 1 tablet (10 mg total) by mouth daily. 90 tablet 3   Fish Oil-Cholecalciferol (FISH OIL + D3) 1200-1000 MG-UNIT CAPS Take 1 capsule by mouth daily.     furosemide (LASIX) 80 MG tablet TAKE 1 TABLET BY MOUTH EVERY DAY 90 tablet 3   hydrALAZINE (APRESOLINE) 50 MG tablet Take 50 mg by mouth 3 (three) times daily.     KLOR-CON M20 20 MEQ tablet TAKE 1 TABLET BY MOUTH EVERY DAY 90 tablet 3   losartan (COZAAR) 100 MG tablet TAKE 1 TABLET BY MOUTH EVERYDAY AT BEDTIME 90 tablet 3   metroNIDAZOLE (FLAGYL) 500 MG tablet Take 1 tablet (500 mg total) by mouth 3 (three) times daily. 30 tablet 0   minoxidil (LONITEN) 2.5 MG tablet Take 1 tablet (2.5 mg total) by mouth daily. 90 tablet 3   Multiple Vitamins-Minerals (CENTRUM SILVER PO) Take 1 tablet by mouth daily.      nitroGLYCERIN (NITROSTAT) 0.4 MG SL tablet Place 0.4 mg under the tongue every 5 (five) minutes as needed for chest pain. Reported on 05/28/2016     pantoprazole  (PROTONIX) 40 MG tablet TAKE 1 TABLET BY MOUTH EVERY DAY 90 tablet 3   Polyethyl Glycol-Propyl Glycol (SYSTANE FREE OP) Place 1 drop into both eyes daily as needed (dry eyes).     Probiotic Product (ALIGN) 4 MG CAPS Take 1 capsule (4 mg total) by mouth daily. 30 capsule 0   vitamin B-12 (CYANOCOBALAMIN) 1000 MCG tablet Take 500 mcg by mouth every other day.      acetaminophen (TYLENOL) 325 MG tablet Take 2 tablets (650 mg total) by mouth every 6 (six) hours as needed for moderate pain. (Patient not taking: Reported on 02/13/2022) 30 tablet 0   hydrALAZINE (APRESOLINE) 25 MG tablet Take 1 tablet (25 mg total) by mouth 3 (three) times daily. 270 tablet 3   hydrocortisone 2.5 % cream Apply topically 2 (two) times daily. (Patient not taking: Reported on 02/13/2022) 30 g 0   tiZANidine (ZANAFLEX) 4 MG capsule  (Patient not taking: Reported on 02/13/2022)     No facility-administered medications prior to visit.    ROS: Review of Systems  Constitutional:  Negative for activity change, appetite change, chills, fatigue and unexpected weight change.  HENT:  Negative for congestion, mouth sores and sinus pressure.  Eyes:  Negative for visual disturbance.  Respiratory:  Negative for cough and chest tightness.   Gastrointestinal:  Negative for abdominal pain and nausea.  Genitourinary:  Negative for difficulty urinating, frequency and vaginal pain.  Musculoskeletal:  Positive for arthralgias. Negative for back pain and gait problem.  Skin:  Negative for pallor and rash.  Neurological:  Negative for dizziness, tremors, weakness, numbness and headaches.  Psychiatric/Behavioral:  Negative for confusion and sleep disturbance.     Objective:  BP (!) 136/50 (BP Location: Left Arm, Patient Position: Sitting, Cuff Size: Normal)   Pulse 65   Temp 97.6 F (36.4 C) (Oral)   Ht '5\' 3"'$  (1.6 m)   Wt 197 lb (89.4 kg)   SpO2 95%   BMI 34.90 kg/m   BP Readings from Last 3 Encounters:  05/12/22 (!) 136/50   03/02/22 (!) 142/50  02/13/22 (!) 92/40    Wt Readings from Last 3 Encounters:  05/12/22 197 lb (89.4 kg)  03/02/22 200 lb (90.7 kg)  02/13/22 202 lb 9.6 oz (91.9 kg)    Physical Exam Constitutional:      General: She is not in acute distress.    Appearance: She is well-developed. She is obese.  HENT:     Head: Normocephalic.     Right Ear: External ear normal.     Left Ear: External ear normal.     Nose: Nose normal.  Eyes:     General:        Right eye: No discharge.        Left eye: No discharge.     Conjunctiva/sclera: Conjunctivae normal.     Pupils: Pupils are equal, round, and reactive to light.  Neck:     Thyroid: No thyromegaly.     Vascular: No JVD.     Trachea: No tracheal deviation.  Cardiovascular:     Rate and Rhythm: Normal rate and regular rhythm.     Heart sounds: Normal heart sounds.  Pulmonary:     Effort: No respiratory distress.     Breath sounds: No stridor. No wheezing.  Abdominal:     General: Bowel sounds are normal. There is no distension.     Palpations: Abdomen is soft. There is no mass.     Tenderness: There is no abdominal tenderness. There is no guarding or rebound.  Musculoskeletal:        General: No tenderness.     Cervical back: Normal range of motion and neck supple. No rigidity.  Lymphadenopathy:     Cervical: No cervical adenopathy.  Skin:    Findings: No erythema or rash.  Neurological:     Mental Status: She is oriented to person, place, and time.     Cranial Nerves: No cranial nerve deficit.     Motor: No abnormal muscle tone.     Coordination: Coordination normal.     Deep Tendon Reflexes: Reflexes normal.  Psychiatric:        Behavior: Behavior normal.        Thought Content: Thought content normal.        Judgment: Judgment normal.     Lab Results  Component Value Date   WBC 5.4 03/02/2022   HGB 10.3 (L) 03/02/2022   HCT 31.3 (L) 03/02/2022   PLT 187.0 03/02/2022   GLUCOSE 97 03/02/2022   CHOL 151  09/30/2021   TRIG 50 09/30/2021   HDL 38 (L) 09/30/2021   LDLDIRECT 127.9 07/14/2011   LDLCALC 103 (H) 09/30/2021  ALT 12 03/02/2022   AST 15 03/02/2022   NA 140 03/02/2022   K 4.3 03/02/2022   CL 106 03/02/2022   CREATININE 1.72 (H) 03/02/2022   BUN 32 (H) 03/02/2022   CO2 25 03/02/2022   TSH 3.27 05/06/2021   INR 0.98 06/07/2014   HGBA1C 5.3 03/02/2022   MICROALBUR 1.9 04/29/2020    US RENAL ARTERY DUPLEX COMPLETE  Result Date: 04/14/2022 CLINICAL DATA:  81 year old female with hypertension and renal disease EXAM: RENAL/URINARY TRACT ULTRASOUND RENAL DUPLEX DOPPLER ULTRASOUND COMPARISON:  None Available. FINDINGS: Right Kidney: Length: 8.8 cm. Echogenicity within normal limits. No mass or hydronephrosis visualized. Left Kidney: Length: 9.3 cm. Echogenicity within normal limits. No mass or hydronephrosis visualized. Bladder:  Unremarkable RENAL DUPLEX ULTRASOUND Right Renal Artery Velocities: Origin:  160 cm/sec Mid:  126 cm/sec [place]:  140 cm/sec Interlobar:  39 cm/sec Arcuate:  29 cm/sec Left Renal Artery Velocities: Origin:  151 cm/sec Mid:  184 cm/sec Hilum:  136 cm/sec Interlobar:  50 cm/sec Arcuate:  24 cm/sec Aortic Velocity:  165 cm/sec Right Renal-Aortic Ratios: Origin: 1.0 Mid:  0.8 Hilum: 0.8 Interlobar: 0.2 Arcuate: 0.2 Left Renal-Aortic Ratios: Origin: 1.0 Mid: 1.1 Hilum: 0.8 Interlobar: 0.3 Arcuate: 0.2 IMPRESSION: Directed duplex of the renal arteries negative for high-grade stenosis Signed, Dulcy Fanny. Dellia Nims, RPVI [facility] Electronically Signed   By: Corrie Mckusick D.O.   On: 04/14/2022 16:05    Assessment & Plan:   Problem List Items Addressed This Visit     CKD (chronic kidney disease) stage 4, GFR 15-29 ml/min (HCC)    Cont w/Hydralazine, Furosemide, Labetalol, Losartan F/u w/Dr Laurena Bering      DM (diabetes mellitus), type 2 with renal complications ([place])    On diet      Hypertensive cardiomegaly     Cont on Hydralazine, Furosemide, Losartan      Hypertensive renal disease    Cont w/Hydralazine, Furosemide, Labetalol, Losartan F/u w/Dr Laurena Bering      OBESITY, MORBID    Doing better Lost wt         No orders of the defined types were placed in this encounter.     Follow-up: No follow-ups on file.  Walker Kehr, MD

## 2022-05-12 NOTE — Assessment & Plan Note (Signed)
Cont w/Hydralazine, Furosemide, Labetalol, Losartan F/u w/Dr Laurena Bering

## 2022-05-12 NOTE — Assessment & Plan Note (Signed)
Doing better Lost wt

## 2022-05-12 NOTE — Assessment & Plan Note (Signed)
Cont on Hydralazine, Furosemide, Losartan

## 2022-05-12 NOTE — Assessment & Plan Note (Signed)
  On diet  

## 2022-05-27 ENCOUNTER — Ambulatory Visit: Payer: Medicare HMO | Admitting: Internal Medicine

## 2022-06-05 DIAGNOSIS — E785 Hyperlipidemia, unspecified: Secondary | ICD-10-CM | POA: Diagnosis not present

## 2022-06-05 LAB — LIPID PANEL
Chol/HDL Ratio: 3 ratio (ref 0.0–4.4)
Cholesterol, Total: 101 mg/dL (ref 100–199)
HDL: 34 mg/dL — ABNORMAL LOW (ref >39)
LDL Chol Calc (NIH): 51 mg/dL (ref 0–99)
Triglycerides: 74 mg/dL (ref 0–149)
VLDL Cholesterol Cal: 16 mg/dL (ref 5–40)

## 2022-06-09 NOTE — Progress Notes (Unsigned)
Cardiology Office Note:    Date:  06/10/2022   ID:  CAMREIGH MICHIE, DOB [birth year], MRN [id number]  PCP:  Cassandria Anger, MD [place] Cardiologist: Pixie Casino, MD   Reason for visit: 3 to 2-monthfollow-up  History of Present Illness:    JSAMEEN LEASis a 81y.o. female with a hx of hyperlipidemia, hypertension, diabetes, PAD and atypical chest pain with negative stress test.    She last saw Dr. HDebara Pickettin March 2023.  She had low blood pressure with associated lightheadedness.  Her hydralazine was decreased from '75mg'$  TID to '25mg'$  TID.  With LDL >70, Zetia was added.    Today, patient feels well.  Lightheadedness resolved.  She states blood pressure overall good, sometimes runs a little higher.  She denies shortness of breath, PND, orthopnea, weight gain, chest pain and palpitations.  She has calf claudication that long distances which is stable.  She denies history of stroke or TIA.  She states no issues with her current medications.    Past Medical History:  Diagnosis Date   CRI (chronic renal insufficiency)    Stage 3   Diabetes mellitus 2011   Type II diet controlled   Diarrhea    GERD (gastroesophageal reflux disease)    Gout    Hyperglycemia    Hyperlipidemia    Hypertension    Lactose intolerance    Macular degeneration    Obesity    Swelling of lower limb     Past Surgical History:  Procedure Laterality Date   [place]  CATARACT EXTRACTION  2010 & 2011   bilateral   CESAREAN SECTION  1969   LUMBAR LAMINECTOMY  2007   PARS PLANA VITRECTOMY W/ REPAIR OF MACULAR HOLE  2010 & 2011   Bilateral    Current Medications: Current Meds  Medication Sig   allopurinol (ZYLOPRIM) 100 MG tablet Take 0.5 tablets (50 mg total) by mouth daily.   amLODipine (NORVASC) 10 MG tablet Take 1 tablet (10 mg total) by mouth daily.   aspirin EC 81 MG EC tablet Take 1 tablet (81 mg total) by mouth daily. Swallow whole.    atorvastatin (LIPITOR) 40 MG tablet TAKE 1 TABLET BY MOUTH EVERY DAY   carvedilol (COREG) 25 MG tablet Take 1 tablet (25 mg total) by mouth 2 (two) times daily with a meal.   Cholecalciferol (VITAMIN D3) 2000 UNITS capsule Take 1 capsule (2,000 Units total) by mouth daily.   Coenzyme Q10 (COQ10) 100 MG CAPS Take 1 capsule by mouth daily.   Fish Oil-Cholecalciferol (FISH OIL + D3) 1200-1000 MG-UNIT CAPS Take 1 capsule by mouth daily.   furosemide (LASIX) 80 MG tablet TAKE 1 TABLET BY MOUTH EVERY DAY   hydrALAZINE (APRESOLINE) 25 MG tablet Take 25 mg by mouth 3 (three) times daily.   KLOR-CON M20 20 MEQ tablet TAKE 1 TABLET BY MOUTH EVERY DAY   losartan (COZAAR) 100 MG tablet TAKE 1 TABLET BY MOUTH EVERYDAY AT BEDTIME   metroNIDAZOLE (FLAGYL) 500 MG tablet Take 1 tablet (500 mg total) by mouth 3 (three) times daily.   minoxidil (LONITEN) 2.5 MG tablet Take 1 tablet (2.5 mg total) by mouth daily.   Multiple Vitamins-Minerals (CENTRUM SILVER PO) Take 1 tablet by mouth daily.    pantoprazole (PROTONIX) 40 MG tablet TAKE 1 TABLET BY MOUTH EVERY DAY   Polyethyl Glycol-Propyl Glycol (SYSTANE FREE OP) Place 1 drop into both eyes  daily as needed (dry eyes).   Probiotic Product (ALIGN) 4 MG CAPS Take 1 capsule (4 mg total) by mouth daily.   vitamin B-12 (CYANOCOBALAMIN) 1000 MCG tablet Take 500 mcg by mouth every other day.    [DISCONTINUED] nitroGLYCERIN (NITROSTAT) 0.4 MG SL tablet Place 0.4 mg under the tongue every 5 (five) minutes as needed for chest pain. Reported on 05/28/2016     Allergies:   Oxycodone   Social History   Socioeconomic History   Marital status: Married    Spouse name: Not on file   Number of children: Not on file   Years of education: Not on file   Highest education level: Not on file  Occupational History   Not on file  Tobacco Use   Smoking status: Never   Smokeless tobacco: Never  Substance and Sexual Activity   Alcohol use: Yes    Alcohol/week: 0.0 standard drinks  of alcohol    Comment: rarely   Drug use: No   Sexual activity: Not on file  Other Topics Concern   Not on file  Social History Narrative   Not on file   Social Determinants of Health   Financial Resource Strain: Low Risk  (06/14/2020)   Overall Financial Resource Strain (CARDIA)    Difficulty of Paying Living Expenses: Not hard at all  Food Insecurity: No Food Insecurity (06/14/2020)   Hunger Vital Sign    Worried About Running Out of Food in the Last Year: Never true    Ran Out of Food in the Last Year: Never true  Transportation Needs: No Transportation Needs (06/14/2020)   PRAPARE - Hydrologist (Medical): No    Lack of Transportation (Non-Medical): No  Physical Activity: Not on file  Stress: No Stress Concern Present (06/14/2020)   [place]    Feeling of Stress : Not at all  Social Connections: [place] (06/14/2020)   Social Connection and Isolation Panel [NHANES]    Frequency of Communication with Friends and Family: More than three times a week    Frequency of Social Gatherings with Friends and Family: More than three times a week    Attends Religious Services: More than 4 times per year    Active Member of [employer] or Organizations: Yes    Attends Music therapist: More than 4 times per year    Marital Status: Married     Family History: The patient's family history includes Cancer in an other family member; Hypertension in her father and mother. There is no history of Colon cancer, Esophageal cancer, Stomach cancer, or Rectal cancer.  ROS:   Please see the history of present illness.     EKGs/Labs/Other Studies Reviewed:    Recent Labs: 03/02/2022: ALT 12; BUN 32; Creatinine, Ser 1.72; Hemoglobin 10.3; Platelets 187.0; Potassium 4.3; Sodium 140   Recent Lipid Panel Lab Results  Component Value Date/Time   CHOL 101 06/05/2022 09:47 AM   TRIG 74  06/05/2022 09:47 AM   HDL 34 (L) 06/05/2022 09:47 AM   LDLCALC 51 06/05/2022 09:47 AM   LDLDIRECT 127.9 07/14/2011 07:47 AM    Physical Exam:    VS:  BP (!) 119/50   Pulse 63   Ht '5\' 3"'$  (1.6 m)   Wt 191 lb 3.2 oz (86.7 kg)   SpO2 97%   BMI 33.87 kg/m    No data found.   Wt Readings from Last 3 Encounters:  06/10/22 191 lb 3.2 oz (86.7 kg)  05/12/22 197 lb (89.4 kg)  03/02/22 200 lb (90.7 kg)     GEN:  Well nourished, well developed in no acute distress HEENT: Normal NECK: No JVD; +bilateral carotid bruits CARDIAC: RRR, no murmurs, rubs, gallops, faint heart sounds RESPIRATORY:  Clear to auscultation without rales, wheezing or rhonchi  ABDOMEN: Soft, non-tender, non-distended MUSCULOSKELETAL: No edema; No deformity  SKIN: Warm and dry NEUROLOGIC:  Alert and oriented PSYCHIATRIC:  Normal affect    ASSESSMENT AND PLAN   Carotid artery stenosis, asymptomatic -Carotid duplex 2021 with moderate left ICA disease, mild right ICA disease -Denies CVA and TIA -Continue aspirin and lipid therapy. -Repeat carotid duplex.  Precordial pain, resolved -Myoview stress test neg in 09/2021 -Continue cardiac risk modification.  PAD, stable -hx of claudication and bilateral mod PAD on duplex -Calf claudication at long distances -stable -Continue aspirin and lipid therapy  Hypertension, well controlled -Managed by nephrology -on amlodipine, carvedilol, hydralazine, losartan, lasix and minoxidil -Goal BP is <130/80.  Recommend DASH diet (high in vegetables, fruits, low-fat dairy products, whole grains, poultry, fish, and nuts and low in sweets, sugar-sweetened beverages, and red meats), salt restriction and increase physical activity.  Hyperlipidemia, well controlled -Lipids July 2023 with LDL 51 -Continue Lipitor and Zetia  Disposition - Follow-up in 6 months with Dr. Debara Pickett.      Medication Adjustments/Labs and Tests Ordered: Current medicines are reviewed at length with  the patient today.  Concerns regarding medicines are outlined above.  Orders Placed This Encounter  Procedures   VAS US CAROTID   Meds ordered this encounter  Medications   nitroGLYCERIN (NITROSTAT) 0.4 MG SL tablet    Sig: Place 1 tablet (0.4 mg total) under the tongue every 5 (five) minutes as needed for chest pain. Reported on 05/28/2016    Dispense:  25 tablet    Refill:  2    Patient Instructions  Medication Instructions:  No Changes *If you need a refill on your cardiac medications before your next appointment, please call your pharmacy*   Lab Work: No Labs If you have labs (blood work) drawn today and your tests are completely normal, you will receive your results only by: [place] (if you have MyChart) OR A paper copy in the mail If you have any lab test that is abnormal or we need to change your treatment, we will call you to review the results.   Testing/Procedures: [address]. Your physician has requested that you have a carotid duplex. This test is an ultrasound of the carotid arteries in your neck. It looks at blood flow through these arteries that supply the brain with blood. Allow one hour for this exam. There are no restrictions or special instructions.    Follow-Up: At [facility], you and your health needs are our priority.  As part of our continuing mission to provide you with exceptional heart care, we have created designated Provider Care Teams.  These Care Teams include your primary Cardiologist (physician) and Advanced Practice Providers (APPs -  Physician Assistants and Nurse Practitioners) who all work together to provide you with the care you need, when you need it.  We recommend signing up for the patient portal called "MyChart".  Sign up information is provided on this After Visit Summary.  MyChart is used to connect with patients for Virtual Visits (Telemedicine).  Patients are able to view lab/test results, encounter notes,  upcoming appointments, etc.  Non-urgent messages can be  sent to your provider as well.   To learn more about what you can do with MyChart, go to [email or web address].    Your next appointment:   6 month(s)  The format for your next appointment:   In Person  Provider:   Caron Presume, PA-C   or, Pixie Casino, MD   :1}      Important Information About Sugar         Signed, Warren Lacy, PA-C  06/10/2022 9:50 AM    [place]

## 2022-06-10 ENCOUNTER — Ambulatory Visit: Payer: Medicare HMO | Admitting: Physician Assistant

## 2022-06-10 ENCOUNTER — Encounter: Payer: Self-pay | Admitting: Physician Assistant

## 2022-06-10 VITALS — BP 119/50 | HR 63 | Ht 63.0 in | Wt 191.2 lb

## 2022-06-10 DIAGNOSIS — I739 Peripheral vascular disease, unspecified: Secondary | ICD-10-CM | POA: Diagnosis not present

## 2022-06-10 DIAGNOSIS — I951 Orthostatic hypotension: Secondary | ICD-10-CM

## 2022-06-10 DIAGNOSIS — I6523 Occlusion and stenosis of bilateral carotid arteries: Secondary | ICD-10-CM

## 2022-06-10 DIAGNOSIS — I10 Essential (primary) hypertension: Secondary | ICD-10-CM | POA: Diagnosis not present

## 2022-06-10 DIAGNOSIS — E785 Hyperlipidemia, unspecified: Secondary | ICD-10-CM | POA: Diagnosis not present

## 2022-06-10 MED ORDER — NITROGLYCERIN 0.4 MG SL SUBL: 0.4000 mg | SUBLINGUAL_TABLET | SUBLINGUAL | 2 refills | Status: AC | PRN

## 2022-06-10 NOTE — Patient Instructions (Signed)
Medication Instructions:  No Changes *If you need a refill on your cardiac medications before your next appointment, please call your pharmacy*   Lab Work: No Labs If you have labs (blood work) drawn today and your tests are completely normal, you will receive your results only by: [place] (if you have MyChart) OR A paper copy in the mail If you have any lab test that is abnormal or we need to change your treatment, we will call you to review the results.   Testing/Procedures: [address]. Your physician has requested that you have a carotid duplex. This test is an ultrasound of the carotid arteries in your neck. It looks at blood flow through these arteries that supply the brain with blood. Allow one hour for this exam. There are no restrictions or special instructions.    Follow-Up: At [facility], you and your health needs are our priority.  As part of our continuing mission to provide you with exceptional heart care, we have created designated Provider Care Teams.  These Care Teams include your primary Cardiologist (physician) and Advanced Practice Providers (APPs -  Physician Assistants and Nurse Practitioners) who all work together to provide you with the care you need, when you need it.  We recommend signing up for the patient portal called "MyChart".  Sign up information is provided on this After Visit Summary.  MyChart is used to connect with patients for Virtual Visits (Telemedicine).  Patients are able to view lab/test results, encounter notes, upcoming appointments, etc.  Non-urgent messages can be sent to your provider as well.   To learn more about what you can do with MyChart, go to [email or web address].    Your next appointment:   6 month(s)  The format for your next appointment:   In Person  Provider:   Caron Presume, PA-C   or, Pixie Casino, MD   :1}      Important Information About Sugar

## 2022-06-15 ENCOUNTER — Ambulatory Visit (INDEPENDENT_AMBULATORY_CARE_PROVIDER_SITE_OTHER): Payer: Medicare HMO | Admitting: Internal Medicine

## 2022-06-15 ENCOUNTER — Encounter: Payer: Self-pay | Admitting: Internal Medicine

## 2022-06-15 VITALS — BP 138/40 | HR 66 | Temp 98.1°F | Ht 63.0 in | Wt 184.0 lb

## 2022-06-15 DIAGNOSIS — N184 Chronic kidney disease, stage 4 (severe): Secondary | ICD-10-CM | POA: Diagnosis not present

## 2022-06-15 DIAGNOSIS — R1032 Left lower quadrant pain: Secondary | ICD-10-CM

## 2022-06-15 DIAGNOSIS — R1030 Lower abdominal pain, unspecified: Secondary | ICD-10-CM

## 2022-06-15 DIAGNOSIS — I10 Essential (primary) hypertension: Secondary | ICD-10-CM

## 2022-06-15 LAB — CBC WITH DIFFERENTIAL/PLATELET
Basophils Absolute: 0.1 10*3/uL (ref 0.0–0.1)
Basophils Relative: 0.5 % (ref 0.0–3.0)
Eosinophils Absolute: 0 10*3/uL (ref 0.0–0.7)
Eosinophils Relative: 0.2 % (ref 0.0–5.0)
HCT: 32.5 % — ABNORMAL LOW (ref 36.0–46.0)
Hemoglobin: 10.8 g/dL — ABNORMAL LOW (ref 12.0–15.0)
Lymphocytes Relative: 12.8 % (ref 12.0–46.0)
Lymphs Abs: 1.3 10*3/uL (ref 0.7–4.0)
MCHC: 33.1 g/dL (ref 30.0–36.0)
MCV: 82.4 fl (ref 78.0–100.0)
Monocytes Absolute: 1.1 10*3/uL — ABNORMAL HIGH (ref 0.1–1.0)
Monocytes Relative: 10.9 % (ref 3.0–12.0)
Neutro Abs: 7.6 10*3/uL (ref 1.4–7.7)
Neutrophils Relative %: 75.6 % (ref 43.0–77.0)
Platelets: 218 10*3/uL (ref 150.0–400.0)
RBC: 3.95 Mil/uL (ref 3.87–5.11)
RDW: 15.3 % (ref 11.5–15.5)
WBC: 10.1 10*3/uL (ref 4.0–10.5)

## 2022-06-15 LAB — HEPATIC FUNCTION PANEL
ALT: 12 U/L (ref 0–35)
AST: 16 U/L (ref 0–37)
Albumin: 3.9 g/dL (ref 3.5–5.2)
Alkaline Phosphatase: 77 U/L (ref 39–117)
Bilirubin, Direct: 0.1 mg/dL (ref 0.0–0.3)
Total Bilirubin: 0.6 mg/dL (ref 0.2–1.2)
Total Protein: 7 g/dL (ref 6.0–8.3)

## 2022-06-15 LAB — BASIC METABOLIC PANEL
BUN: 30 mg/dL — ABNORMAL HIGH (ref 6–23)
CO2: 26 mEq/L (ref 19–32)
Calcium: 9.6 mg/dL (ref 8.4–10.5)
Chloride: 104 mEq/L (ref 96–112)
Creatinine, Ser: 1.98 mg/dL — ABNORMAL HIGH (ref 0.40–1.20)
GFR: 23.51 mL/min — ABNORMAL LOW (ref >60.00)
Glucose, Bld: 114 mg/dL — ABNORMAL HIGH (ref 70–99)
Potassium: 4.3 mEq/L (ref 3.5–5.1)
Sodium: 140 mEq/L (ref 135–145)

## 2022-06-15 LAB — URINALYSIS, ROUTINE W REFLEX MICROSCOPIC
Bilirubin Urine: NEGATIVE
Hgb urine dipstick: NEGATIVE
Ketones, ur: NEGATIVE
Nitrite: NEGATIVE
Specific Gravity, Urine: <=1.005 — AB (ref 1.000–1.030)
Total Protein, Urine: NEGATIVE
Urine Glucose: NEGATIVE
Urobilinogen, UA: 0.2 (ref 0.0–1.0)
pH: 6 (ref 5.0–8.0)

## 2022-06-15 LAB — LIPASE: Lipase: 49 U/L (ref 11.0–59.0)

## 2022-06-15 MED ORDER — ONDANSETRON HCL 4 MG PO TABS: 4.0000 mg | ORAL_TABLET | Freq: Three times a day (TID) | ORAL | 0 refills | Status: AC | PRN

## 2022-06-15 NOTE — Progress Notes (Unsigned)
Patient ID: Bridget Oliver, female   DOB: Apr 12, 1941, 81 y.o.   MRN: [id number]        Chief Complaint: follow up nausea and lower abd pain       HPI:  Bridget Oliver is a 81 y.o. female here with c/o recurrence of nausea with lower abd pain x 1 wk, mild intermittent, worse this am, wrose after eating, and has recent wt loss 197 - 184 for unclear reason.  Denies worsening reflux, dysphagia, vomiting, bowel change or blood except for possible mild constipation for which she had prunes last evening, small BM this am but did not help the lower abd pain.  Denies urinary symptoms such as dysuria, frequency, urgency, flank pain, hematuria or n/v, fever, chills.  Saw PCP for similar in April 2023  - lab and abd xray neg for acute or increased stool burden.  Last colonoscopy 2019 with polyp, pt is s/p appy, and TAH with probable right oophorectomy per pt.         Wt Readings from Last 3 Encounters:  06/15/22 184 lb (83.5 kg)  06/10/22 191 lb 3.2 oz (86.7 kg)  05/12/22 197 lb (89.4 kg)   BP Readings from Last 3 Encounters:  06/15/22 (!) 138/40  06/10/22 (!) 119/50  05/12/22 (!) 136/50         Past Medical History:  Diagnosis Date   CRI (chronic renal insufficiency)    Stage 3   Diabetes mellitus 2011   Type II diet controlled   Diarrhea    GERD (gastroesophageal reflux disease)    Gout    Hyperglycemia    Hyperlipidemia    Hypertension    Lactose intolerance    Macular degeneration    Obesity    Swelling of lower limb    Past Surgical History:  Procedure Laterality Date   [place]   CATARACT EXTRACTION  2010 & 2011   bilateral   CESAREAN SECTION  1969   LUMBAR LAMINECTOMY  2007   PARS PLANA VITRECTOMY W/ REPAIR OF MACULAR HOLE  2010 & 2011   Bilateral    reports that she has never smoked. She has never used smokeless tobacco. She reports current alcohol use. She reports that she does not use drugs. family history includes Cancer in an other  family member; Hypertension in her father and mother. Allergies  Allergen Reactions   Oxycodone     Nausea Can take codein   Current Outpatient Medications on File Prior to Visit  Medication Sig Dispense Refill   allopurinol (ZYLOPRIM) 100 MG tablet Take 0.5 tablets (50 mg total) by mouth daily. 45 tablet 3   amLODipine (NORVASC) 10 MG tablet Take 1 tablet (10 mg total) by mouth daily. 90 tablet 3   aspirin EC 81 MG EC tablet Take 1 tablet (81 mg total) by mouth daily. Swallow whole. 30 tablet 11   atorvastatin (LIPITOR) 40 MG tablet TAKE 1 TABLET BY MOUTH EVERY DAY 90 tablet 2   carvedilol (COREG) 25 MG tablet Take 1 tablet (25 mg total) by mouth 2 (two) times daily with a meal. 180 tablet 3   Cholecalciferol (VITAMIN D3) 2000 UNITS capsule Take 1 capsule (2,000 Units total) by mouth daily. 100 capsule 3   Coenzyme Q10 (COQ10) 100 MG CAPS Take 1 capsule by mouth daily.     Fish Oil-Cholecalciferol (FISH OIL + D3) 1200-1000 MG-UNIT CAPS Take 1 capsule by mouth daily.  furosemide (LASIX) 80 MG tablet TAKE 1 TABLET BY MOUTH EVERY DAY 90 tablet 3   hydrALAZINE (APRESOLINE) 25 MG tablet Take 25 mg by mouth 3 (three) times daily.     KLOR-CON M20 20 MEQ tablet TAKE 1 TABLET BY MOUTH EVERY DAY 90 tablet 3   losartan (COZAAR) 100 MG tablet TAKE 1 TABLET BY MOUTH EVERYDAY AT BEDTIME 90 tablet 3   minoxidil (LONITEN) 2.5 MG tablet Take 1 tablet (2.5 mg total) by mouth daily. 90 tablet 3   Multiple Vitamins-Minerals (CENTRUM SILVER PO) Take 1 tablet by mouth daily.      nitroGLYCERIN (NITROSTAT) 0.4 MG SL tablet Place 1 tablet (0.4 mg total) under the tongue every 5 (five) minutes as needed for chest pain. Reported on 05/28/2016 25 tablet 2   pantoprazole (PROTONIX) 40 MG tablet TAKE 1 TABLET BY MOUTH EVERY DAY 90 tablet 3   Polyethyl Glycol-Propyl Glycol (SYSTANE FREE OP) Place 1 drop into both eyes daily as needed (dry eyes).     Probiotic Product (ALIGN) 4 MG CAPS Take 1 capsule (4 mg total) by  mouth daily. 30 capsule 0   vitamin B-12 (CYANOCOBALAMIN) 1000 MCG tablet Take 500 mcg by mouth every other day.      ezetimibe (ZETIA) 10 MG tablet Take 1 tablet (10 mg total) by mouth daily. 90 tablet 3   metroNIDAZOLE (FLAGYL) 500 MG tablet Take 1 tablet (500 mg total) by mouth 3 (three) times daily. (Patient not taking: Reported on 06/15/2022) 30 tablet 0   No current facility-administered medications on file prior to visit.        ROS:  All others reviewed and negative.  Objective        PE:  BP (!) 138/40 (BP Location: Right Arm, Patient Position: Sitting, Cuff Size: Large)   Pulse 66   Temp 98.1 F (36.7 C) (Oral)   Ht '5\' 3"'$  (1.6 m)   Wt 184 lb (83.5 kg)   SpO2 98%   BMI 32.59 kg/m                 Constitutional: Pt appears in NAD               HENT: Head: NCAT.                Right Ear: External ear normal.                 Left Ear: External ear normal.                Eyes: . Pupils are equal, round, and reactive to light. Conjunctivae and EOM are normal               Nose: without d/c or deformity               Neck: Neck supple. Gross normal ROM               Cardiovascular: Normal rate and regular rhythm.                 Pulmonary/Chest: Effort normal and breath sounds without rales or wheezing.                Abd:  Soft, mild low mid and left lower tender, ND, + BS, no organomegaly               Neurological: Pt is alert. At baseline orientation, motor grossly intact  Skin: Skin is warm. No rashes, no other new lesions, LE edema - none               Psychiatric: Pt behavior is normal without agitation   Micro: none  Cardiac tracings I have personally interpreted today:  none  Pertinent Radiological findings (summarize): none   Lab Results  Component Value Date   WBC 10.1 06/15/2022   HGB 10.8 (L) 06/15/2022   HCT 32.5 (L) 06/15/2022   PLT 218.0 06/15/2022   GLUCOSE 114 (H) 06/15/2022   CHOL 101 06/05/2022   TRIG 74 06/05/2022   HDL 34 (L)  06/05/2022   LDLDIRECT 127.9 07/14/2011   LDLCALC 51 06/05/2022   ALT 12 06/15/2022   AST 16 06/15/2022   NA 140 06/15/2022   K 4.3 06/15/2022   CL 104 06/15/2022   CREATININE 1.98 (H) 06/15/2022   BUN 30 (H) 06/15/2022   CO2 26 06/15/2022   TSH 3.27 05/06/2021   INR 0.98 06/07/2014   HGBA1C 5.3 03/02/2022   MICROALBUR 1.9 04/29/2020   Assessment/Plan:  Bridget Oliver is a 81 y.o. Black or African American [2] female with  has a past medical history of CRI (chronic renal insufficiency), Diabetes mellitus (2011), Diarrhea, GERD (gastroesophageal reflux disease), Gout, Hyperglycemia, Hyperlipidemia, Hypertension, Lactose intolerance, Macular degeneration, Obesity, and Swelling of lower limb.  Abdominal pain Etiology unclear, at least mild to mod for several months with intemittent nasuea and more recent wt loss without obvious improvement with bowel movement - for now to try daily miralax otc, repeat labs including cbc, lipase, UA and this time for CT abd/pelvis r/o other such as pelvic disorder or malignancy  Hypertension BP Readings from Last 3 Encounters:  06/15/22 (!) 138/40  06/10/22 (!) 119/50  05/12/22 (!) 136/50   Stable, pt to continue medical treatment norvasc 10 qd, coreg 25 bid, hydralazine 25 tid   CKD (chronic kidney disease) stage 4, GFR 15-29 ml/min (HCC) Lab Results  Component Value Date   CREATININE 1.98 (H) 06/15/2022   Stable overall, cont to avoid nephrotoxins  Followup: Return if symptoms worsen or fail to improve.  Cathlean Cower, MD 06/17/2022 7:27 PM [facility]

## 2022-06-15 NOTE — Patient Instructions (Signed)
Please take all new medication as prescribed - the zofran as needed for nausea  Please take all new medication as recommended - miralax daily for possible constipation  Please continue all other medications as before, and refills have been done if requested.  Please have the pharmacy call with any other refills you may need.  Please keep your appointments with your specialists as you may have planned  You will be contacted regarding the referral for: CT scan  Please go to the LAB at the blood drawing area for the tests to be done  You will be contacted by phone if any changes need to be made immediately.  Otherwise, you will receive a letter about your results with an explanation, but please check with MyChart first.  Please remember to sign up for MyChart if you have not done so, as this will be important to you in the future with finding out test results, communicating by private email, and scheduling acute appointments online when needed.

## 2022-06-17 ENCOUNTER — Telehealth: Payer: Self-pay | Admitting: Internal Medicine

## 2022-06-17 ENCOUNTER — Encounter: Payer: Self-pay | Admitting: Internal Medicine

## 2022-06-17 NOTE — Telephone Encounter (Signed)
Mariam from [facility] called and is requesting the width diagnosis for the CT Abdomen pelvis w wo contrast order.   Please advise  Callback: Mariam [phone] ext [phone]

## 2022-06-17 NOTE — Assessment & Plan Note (Signed)
BP Readings from Last 3 Encounters:  06/15/22 (!) 138/40  06/10/22 (!) 119/50  05/12/22 (!) 136/50   Stable, pt to continue medical treatment norvasc 10 qd, coreg 25 bid, hydralazine 25 tid

## 2022-06-17 NOTE — Telephone Encounter (Signed)
Per chart pt saw Dr Jenny Reichmann 06/15/22, and he ordered CT Abdomen. Called Mariam back no answer LMOM CT Abdomen was order by Dr. Cathlean Cower Dx (L) Lower Quadrant Pain and he order with and out contrast../lmb

## 2022-06-17 NOTE — Assessment & Plan Note (Signed)
Etiology unclear, at least mild to mod for several months with intemittent nasuea and more recent wt loss without obvious improvement with bowel movement - for now to try daily miralax otc, repeat labs including cbc, lipase, UA and this time for CT abd/pelvis r/o other such as pelvic disorder or malignancy

## 2022-06-17 NOTE — Assessment & Plan Note (Signed)
Lab Results  Component Value Date   CREATININE 1.98 (H) 06/15/2022   Stable overall, cont to avoid nephrotoxins

## 2022-06-18 ENCOUNTER — Telehealth: Payer: Self-pay

## 2022-06-18 ENCOUNTER — Ambulatory Visit (HOSPITAL_COMMUNITY)
Admission: RE | Admit: 2022-06-18 | Discharge: 2022-06-18 | Disposition: A | Discharge status: Home / Self Care | Payer: Medicare HMO | Source: Ambulatory Visit | Attending: Cardiology | Admitting: Cardiology

## 2022-06-18 DIAGNOSIS — I951 Orthostatic hypotension: Secondary | ICD-10-CM

## 2022-06-18 DIAGNOSIS — E785 Hyperlipidemia, unspecified: Secondary | ICD-10-CM | POA: Diagnosis not present

## 2022-06-18 DIAGNOSIS — I6523 Occlusion and stenosis of bilateral carotid arteries: Secondary | ICD-10-CM

## 2022-06-18 DIAGNOSIS — I10 Essential (primary) hypertension: Secondary | ICD-10-CM | POA: Diagnosis not present

## 2022-06-18 DIAGNOSIS — I739 Peripheral vascular disease, unspecified: Secondary | ICD-10-CM | POA: Diagnosis not present

## 2022-06-18 NOTE — Telephone Encounter (Addendum)
Called patient regarding results. Left message for patient to call office.----- Message from Warren Lacy, PA-C sent at 06/18/2022 11:31 AM EDT ----- Moderate left ICA disease, mild right ICA disease --> stable from 2021.   Recheck carotid duplex in 1-2 years.   -Continue aspirin and lipid therapy.

## 2022-06-18 NOTE — Progress Notes (Signed)
Moderate left ICA disease, mild right ICA disease --> stable from 2021.   Recheck carotid duplex in 1-2 years.   -Continue aspirin and lipid therapy.

## 2022-06-25 ENCOUNTER — Other Ambulatory Visit: Payer: Self-pay | Admitting: Internal Medicine

## 2022-06-25 ENCOUNTER — Ambulatory Visit
Admission: RE | Admit: 2022-06-25 | Discharge: 2022-06-25 | Disposition: A | Discharge status: Home / Self Care | Payer: Medicare HMO | Source: Ambulatory Visit | Attending: Internal Medicine | Admitting: Internal Medicine

## 2022-06-25 DIAGNOSIS — R1032 Left lower quadrant pain: Secondary | ICD-10-CM

## 2022-06-25 DIAGNOSIS — K8020 Calculus of gallbladder without cholecystitis without obstruction: Secondary | ICD-10-CM | POA: Diagnosis not present

## 2022-06-25 DIAGNOSIS — K449 Diaphragmatic hernia without obstruction or gangrene: Secondary | ICD-10-CM | POA: Diagnosis not present

## 2022-06-25 DIAGNOSIS — R109 Unspecified abdominal pain: Secondary | ICD-10-CM | POA: Diagnosis not present

## 2022-06-25 MED ORDER — METRONIDAZOLE 500 MG PO TABS: 500.0000 mg | ORAL_TABLET | Freq: Three times a day (TID) | ORAL | 0 refills | Status: DC

## 2022-06-25 MED ORDER — CIPROFLOXACIN HCL 500 MG PO TABS: 500.0000 mg | ORAL_TABLET | Freq: Two times a day (BID) | ORAL | 0 refills | Status: AC

## 2022-06-26 ENCOUNTER — Ambulatory Visit: Payer: Medicare HMO | Admitting: Podiatry

## 2022-06-30 ENCOUNTER — Ambulatory Visit: Payer: Medicare HMO | Admitting: Podiatry

## 2022-06-30 ENCOUNTER — Encounter: Payer: Self-pay | Admitting: Podiatry

## 2022-06-30 DIAGNOSIS — M79675 Pain in left toe(s): Secondary | ICD-10-CM

## 2022-06-30 DIAGNOSIS — M79674 Pain in right toe(s): Secondary | ICD-10-CM

## 2022-06-30 DIAGNOSIS — E1122 Type 2 diabetes mellitus with diabetic chronic kidney disease: Secondary | ICD-10-CM

## 2022-06-30 DIAGNOSIS — N1831 Chronic kidney disease, stage 3a: Secondary | ICD-10-CM | POA: Diagnosis not present

## 2022-06-30 DIAGNOSIS — B351 Tinea unguium: Secondary | ICD-10-CM

## 2022-07-06 NOTE — Progress Notes (Signed)
  Subjective:  Patient ID: Bridget Oliver, female    DOB: Dec 25, 1940,  MRN: [id number]  Bridget Oliver presents to clinic today for painful elongated mycotic toenails 1-5 bilaterally which are tender when wearing enclosed shoe gear. Pain is relieved with periodic professional debridement.  New problem(s): None.   PCP is [name], [name], MD , and last visit was  March 02, 2022  Allergies  Allergen Reactions   Oxycodone     Nausea Can take codein    Review of Systems: Negative except as noted in the HPI.  Objective: No changes noted in today's physical examination. Constitutional Bridget Oliver is a pleasant 81 y.o. African American female, in NAD. AAO x 3.   Vascular CFT immediate b/l LE. Palpable DP/PT pulses b/l LE. Digital hair sparse b/l. Skin temperature gradient WNL b/l. No pain with calf compression b/l. No edema noted b/l. No cyanosis or clubbing noted b/l LE.  Neurologic Normal speech. Oriented to person, place, and time. Protective sensation intact 5/5 intact bilaterally with 10g monofilament b/l.  Dermatologic Pedal integument with normal turgor, texture and tone b/l LE. No open wounds b/l. No interdigital macerations b/l. Toenails 2-5 bilaterally and L hallux elongated, thickened, discolored with subungual debris. +Tenderness with dorsal palpation of nailplates. No hyperkeratotic or porokeratotic lesions present.  Orthopedic: Normal muscle strength 5/5 to all lower extremity muscle groups bilaterally. HAV with bunion deformity noted b/l LE. Hammertoe(s) noted to the L 2nd toe. No pain, crepitus or joint limitation noted with ROM b/l LE.  Patient ambulates independently without assistive aids.   Radiographs: None    Latest Ref Rng & Units 03/02/2022   10:36 AM  Hemoglobin A1C  Hemoglobin-A1c 4.6 - 6.5 % 5.3    Assessment/Plan: 1. Pain due to onychomycosis of toenails of both feet   2. Type 2 diabetes mellitus with stage 3a chronic kidney disease, without long-term current use of  insulin ([place])   -Examined patient. -No new findings. No new orders. -Mycotic toenails 1-5 bilaterally were debrided in length and girth with sterile nail nippers and dremel without incident. -Patient/POA to call should there be question/concern in the interim.   Return in about 3 months (around 09/30/2022).  Marzetta Board, DPM

## 2022-07-10 DIAGNOSIS — M85852 Other specified disorders of bone density and structure, left thigh: Secondary | ICD-10-CM | POA: Diagnosis not present

## 2022-07-10 DIAGNOSIS — Z780 Asymptomatic menopausal state: Secondary | ICD-10-CM | POA: Diagnosis not present

## 2022-07-31 DIAGNOSIS — E1122 Type 2 diabetes mellitus with diabetic chronic kidney disease: Secondary | ICD-10-CM | POA: Diagnosis not present

## 2022-07-31 DIAGNOSIS — N184 Chronic kidney disease, stage 4 (severe): Secondary | ICD-10-CM | POA: Diagnosis not present

## 2022-07-31 DIAGNOSIS — N2581 Secondary hyperparathyroidism of renal origin: Secondary | ICD-10-CM | POA: Diagnosis not present

## 2022-07-31 DIAGNOSIS — E785 Hyperlipidemia, unspecified: Secondary | ICD-10-CM | POA: Diagnosis not present

## 2022-07-31 DIAGNOSIS — N189 Chronic kidney disease, unspecified: Secondary | ICD-10-CM | POA: Diagnosis not present

## 2022-07-31 DIAGNOSIS — I129 Hypertensive chronic kidney disease with stage 1 through stage 4 chronic kidney disease, or unspecified chronic kidney disease: Secondary | ICD-10-CM | POA: Diagnosis not present

## 2022-08-05 DIAGNOSIS — H40023 Open angle with borderline findings, high risk, bilateral: Secondary | ICD-10-CM | POA: Diagnosis not present

## 2022-08-05 DIAGNOSIS — R7309 Other abnormal glucose: Secondary | ICD-10-CM | POA: Diagnosis not present

## 2022-09-23 ENCOUNTER — Other Ambulatory Visit: Payer: Self-pay | Admitting: Internal Medicine

## 2022-09-23 ENCOUNTER — Telehealth: Payer: Self-pay | Admitting: Internal Medicine

## 2022-09-23 NOTE — Telephone Encounter (Signed)
Left message for patient to call back to schedule Medicare Annual Wellness Visit   Last AWV  06/14/20  Please schedule at anytime with [facility] if patient calls the office back.     Any questions, please call me at [phone]

## 2022-09-24 NOTE — Telephone Encounter (Signed)
Patient didn't want to schedule at this time

## 2022-10-06 ENCOUNTER — Ambulatory Visit: Payer: Medicare HMO | Admitting: Podiatry

## 2022-10-06 DIAGNOSIS — M2012 Hallux valgus (acquired), left foot: Secondary | ICD-10-CM

## 2022-10-06 DIAGNOSIS — M79675 Pain in left toe(s): Secondary | ICD-10-CM | POA: Diagnosis not present

## 2022-10-06 DIAGNOSIS — N1831 Chronic kidney disease, stage 3a: Secondary | ICD-10-CM

## 2022-10-06 DIAGNOSIS — E119 Type 2 diabetes mellitus without complications: Secondary | ICD-10-CM | POA: Diagnosis not present

## 2022-10-06 DIAGNOSIS — B351 Tinea unguium: Secondary | ICD-10-CM | POA: Diagnosis not present

## 2022-10-06 DIAGNOSIS — M2011 Hallux valgus (acquired), right foot: Secondary | ICD-10-CM | POA: Diagnosis not present

## 2022-10-06 DIAGNOSIS — M2042 Other hammer toe(s) (acquired), left foot: Secondary | ICD-10-CM

## 2022-10-06 DIAGNOSIS — M79674 Pain in right toe(s): Secondary | ICD-10-CM

## 2022-10-06 DIAGNOSIS — M2041 Other hammer toe(s) (acquired), right foot: Secondary | ICD-10-CM

## 2022-10-06 DIAGNOSIS — E1122 Type 2 diabetes mellitus with diabetic chronic kidney disease: Secondary | ICD-10-CM

## 2022-10-11 ENCOUNTER — Encounter: Payer: Self-pay | Admitting: Podiatry

## 2022-10-11 NOTE — Progress Notes (Signed)
ANNUAL DIABETIC FOOT EXAM  Subjective: Bridget Oliver presents today for annual diabetic foot examination.  Chief Complaint  Patient presents with   Nail Problem    Nail Trim Not Diabetic PCP - Dr Alain Marion last OV June 2023   Patient confirms h/o diabetes.  Patient denies any h/o foot wounds.  Risk factors: diabetes, HTN, hyperlipidemia, dyslipidemia.  [name], [name], MD is patient's PCP.  Past Medical History:  Diagnosis Date   CRI (chronic renal insufficiency)    Stage 3   Diabetes mellitus 2011   Type II diet controlled   Diarrhea    GERD (gastroesophageal reflux disease)    Gout    Hyperglycemia    Hyperlipidemia    Hypertension    Lactose intolerance    Macular degeneration    Obesity    Swelling of lower limb    Patient Active Problem List   Diagnosis Date Noted   Claudication ([place]) 01/19/2022   Chest pain 09/30/2021   Hypertensive urgency 09/30/2021   CKD (chronic kidney disease) stage 4, GFR 15-29 ml/min ([place]) 09/30/2021   Gouty arthritis 09/12/2021   Hypertension 06/10/2021   Mixed hyperlipidemia 08/08/2018   Cough 06/27/2018   GERD (gastroesophageal reflux disease) 06/24/2017   LBP radiating to right leg 05/15/2016   Well adult exam 12/19/2015   History of colonic polyps 11/26/2014   Right tennis elbow 11/21/2013   Right forearm pain 11/13/2013   Wrist pain 06/06/2013   DM (diabetes mellitus), type 2 with renal complications ([place]) 08/15/3006   Hand pain, right 05/07/2011   Bursitis of elbow 05/07/2011   Atypical chest pain 03/30/2011   Abdominal pain 03/30/2011   Hypertensive cardiomegaly 01/12/2011   Carotid artery stenosis 03/17/2010   Gout 03/10/2010   Disorder of kidney and ureter 07/20/2008   Dyslipidemia 03/21/2008   OBESITY, MORBID 03/21/2008   B12 deficiency 09/21/2007   Hypertensive renal disease 09/21/2007   Edema 09/21/2007   Past Surgical History:  Procedure Laterality Date   [place]   CATARACT EXTRACTION  2010 & 2011   bilateral   CESAREAN SECTION  1969   LUMBAR LAMINECTOMY  2007   PARS PLANA VITRECTOMY W/ REPAIR OF MACULAR HOLE  2010 & 2011   Bilateral   Current Outpatient Medications on File Prior to Visit  Medication Sig Dispense Refill   allopurinol (ZYLOPRIM) 100 MG tablet TAKE 1/2 TABLET BY MOUTH EVERY DAY 45 tablet 1   amLODipine (NORVASC) 10 MG tablet Take 1 tablet (10 mg total) by mouth daily. 90 tablet 3   aspirin EC 81 MG EC tablet Take 1 tablet (81 mg total) by mouth daily. Swallow whole. 30 tablet 11   atorvastatin (LIPITOR) 40 MG tablet TAKE 1 TABLET BY MOUTH EVERY DAY 90 tablet 2   carvedilol (COREG) 25 MG tablet Take 1 tablet (25 mg total) by mouth 2 (two) times daily with a meal. 180 tablet 3   Cholecalciferol (VITAMIN D3) 2000 UNITS capsule Take 1 capsule (2,000 Units total) by mouth daily. 100 capsule 3   Coenzyme Q10 (COQ10) 100 MG CAPS Take 1 capsule by mouth daily.     ezetimibe (ZETIA) 10 MG tablet Take 1 tablet (10 mg total) by mouth daily. 90 tablet 3   Fish Oil-Cholecalciferol (FISH OIL + D3) 1200-1000 MG-UNIT CAPS Take 1 capsule by mouth daily.     furosemide (LASIX) 80 MG tablet TAKE 1 TABLET BY MOUTH EVERY DAY 90 tablet 3  hydrALAZINE (APRESOLINE) 25 MG tablet Take 25 mg by mouth 3 (three) times daily.     hydrALAZINE (APRESOLINE) 50 MG tablet Take 50 mg by mouth 3 (three) times daily.     KLOR-CON M20 20 MEQ tablet TAKE 1 TABLET BY MOUTH EVERY DAY 90 tablet 3   losartan (COZAAR) 100 MG tablet TAKE 1 TABLET BY MOUTH EVERYDAY AT BEDTIME 90 tablet 3   metroNIDAZOLE (FLAGYL) 500 MG tablet Take 1 tablet (500 mg total) by mouth 3 (three) times daily. 30 tablet 0   minoxidil (LONITEN) 2.5 MG tablet Take 1 tablet (2.5 mg total) by mouth daily. 90 tablet 3   Multiple Vitamins-Minerals (CENTRUM SILVER PO) Take 1 tablet by mouth daily.      nitroGLYCERIN (NITROSTAT) 0.4 MG SL tablet Place 1 tablet (0.4 mg total) under the tongue every 5 (five)  minutes as needed for chest pain. Reported on 05/28/2016 25 tablet 2   ondansetron (ZOFRAN) 4 MG tablet Take 1 tablet (4 mg total) by mouth every 8 (eight) hours as needed for nausea or vomiting. 30 tablet 0   pantoprazole (PROTONIX) 40 MG tablet TAKE 1 TABLET BY MOUTH EVERY DAY 90 tablet 3   Polyethyl Glycol-Propyl Glycol (SYSTANE FREE OP) Place 1 drop into both eyes daily as needed (dry eyes).     Probiotic Product (ALIGN) 4 MG CAPS Take 1 capsule (4 mg total) by mouth daily. 30 capsule 0   vitamin B-12 (CYANOCOBALAMIN) 1000 MCG tablet Take 500 mcg by mouth every other day.      No current facility-administered medications on file prior to visit.    Allergies  Allergen Reactions   Oxycodone     Nausea Can take codein   Social History   Occupational History   Not on file  Tobacco Use   Smoking status: Never   Smokeless tobacco: Never  Substance and Sexual Activity   Alcohol use: Yes    Alcohol/week: 0.0 standard drinks of alcohol    Comment: rarely   Drug use: No   Sexual activity: Not on file   Family History  Problem Relation Age of Onset   Hypertension Mother    Hypertension Father    Cancer Other        Lung   Colon cancer Neg Hx    Esophageal cancer Neg Hx    Stomach cancer Neg Hx    Rectal cancer Neg Hx    Immunization History  Administered Date(s) Administered   Fluad Quad(high Dose 65+) 08/11/2019, 08/19/2020   Influenza Whole 10/07/2010, 07/25/2011, 08/26/2012   Influenza, High Dose Seasonal PF 09/14/2013, 09/11/2017, 09/13/2018, 08/23/2021, 09/10/2021   Influenza,inj,Quad PF,6+ Mos 07/25/2014, 08/16/2015   PFIZER Comirnaty(Gray Top)Covid-19 Tri-Sucrose Vaccine 03/26/2021   PFIZER(Purple Top)SARS-COV-2 Vaccination 12/12/2019, 01/02/2020, 08/27/2020   PNEUMOCOCCAL CONJUGATE-20 11/05/2021   Pneumococcal Conjugate-13 11/26/2014   Pneumococcal Polysaccharide-23 11/10/2011   Tdap 11/10/2011, 06/10/2021   Zoster Recombinat (Shingrix) 09/08/2018, 01/10/2019    Zoster, Live 02/28/2008     Review of Systems: Negative except as noted in the HPI.   Objective: There were no vitals filed for this visit.  Bridget Oliver is a pleasant 81 y.o. female in NAD. AAO X 3.  Vascular Examination: Capillary refill time immediate b/l.Vascular status intact b/l with palpable pedal pulses. Pedal hair sparse b/l. No edema. No pain with calf compression b/l. Skin temperature gradient WNL b/l.   Neurological Examination: Sensation grossly intact b/l with 10 gram monofilament. Vibratory sensation intact b/l.   Dermatological Examination: Pedal skin  with normal turgor, texture and tone b/l. Toenails 1-5 b/l thick, discolored, elongated with subungual debris and pain on dorsal palpation. No open wounds b/l LE. No interdigital macerations noted b/l LE. No hyperkeratotic nor porokeratotic lesions present on today's visit.  Musculoskeletal Examination: Normal muscle strength 5/5 to all lower extremity muscle groups bilaterally. HAV with bunion deformity noted b/l LE. Hammertoe(s) noted to the left second digit.Marland Kitchen No pain, crepitus or joint limitation noted with ROM b/l LE.  Patient ambulates independently without assistive aids.  Radiographs: None  Last A1c:      Latest Ref Rng & Units 03/02/2022   10:36 AM  Hemoglobin A1C  Hemoglobin-A1c 4.6 - 6.5 % 5.3    Footwear Assessment: Does the patient wear appropriate shoes? Yes. Does the patient need inserts/orthotics? No.  ADA Risk Categorization: Low Risk :  Patient has all of the following: Intact protective sensation No prior foot ulcer  No severe deformity Pedal pulses present  Assessment: 1. Pain due to onychomycosis of toenails of both feet   2. Hallux valgus, acquired, bilateral   3. Acquired hammertoes of both feet   4. Type 2 diabetes mellitus with stage 3a chronic kidney disease, without long-term current use of insulin ([place])   5. Encounter for diabetic foot exam ([place])     Plan: -Consent given for  treatment as described below: -Diabetic foot examination performed today. -Continue supportive shoe gear daily. -Toenails 1-5 bilaterally debrided in length and girth without iatrogenic bleeding with sterile nail nipper and dremel.  -Patient/POA to call should there be question/concern in the interim. Return in about 3 months (around 01/06/2023).  Marzetta Board, DPM

## 2022-10-25 ENCOUNTER — Other Ambulatory Visit: Payer: Self-pay | Admitting: Internal Medicine

## 2022-11-03 ENCOUNTER — Other Ambulatory Visit: Payer: Self-pay | Admitting: Internal Medicine

## 2022-11-03 DIAGNOSIS — H40023 Open angle with borderline findings, high risk, bilateral: Secondary | ICD-10-CM | POA: Diagnosis not present

## 2022-11-09 ENCOUNTER — Other Ambulatory Visit: Payer: Self-pay | Admitting: Internal Medicine

## 2022-11-09 ENCOUNTER — Telehealth: Payer: Self-pay | Admitting: Internal Medicine

## 2022-11-09 NOTE — Telephone Encounter (Signed)
LVM for pt to rtn my call to schedule AWV with [facility] call back # [phone] 

## 2022-11-11 ENCOUNTER — Ambulatory Visit (INDEPENDENT_AMBULATORY_CARE_PROVIDER_SITE_OTHER): Payer: Medicare HMO | Admitting: Internal Medicine

## 2022-11-11 ENCOUNTER — Encounter: Payer: Self-pay | Admitting: Internal Medicine

## 2022-11-11 VITALS — BP 120/64 | HR 65 | Temp 98.0°F | Ht 63.0 in | Wt 167.0 lb

## 2022-11-11 DIAGNOSIS — I129 Hypertensive chronic kidney disease with stage 1 through stage 4 chronic kidney disease, or unspecified chronic kidney disease: Secondary | ICD-10-CM

## 2022-11-11 DIAGNOSIS — N1831 Chronic kidney disease, stage 3a: Secondary | ICD-10-CM | POA: Diagnosis not present

## 2022-11-11 DIAGNOSIS — E1122 Type 2 diabetes mellitus with diabetic chronic kidney disease: Secondary | ICD-10-CM | POA: Diagnosis not present

## 2022-11-11 DIAGNOSIS — I10 Essential (primary) hypertension: Secondary | ICD-10-CM

## 2022-11-11 DIAGNOSIS — I119 Hypertensive heart disease without heart failure: Secondary | ICD-10-CM | POA: Diagnosis not present

## 2022-11-11 DIAGNOSIS — E785 Hyperlipidemia, unspecified: Secondary | ICD-10-CM | POA: Diagnosis not present

## 2022-11-11 LAB — POCT GLYCOSYLATED HEMOGLOBIN (HGB A1C): Hemoglobin A1C: 5.1 % (ref 4.0–5.6)

## 2022-11-11 NOTE — Assessment & Plan Note (Signed)
  On diet  

## 2022-11-11 NOTE — Assessment & Plan Note (Signed)
Cont w/Hydralazine, Furosemide, Coreg, Losartan F/u w/Dr Laurena Bering Off Minoxidil

## 2022-11-11 NOTE — Progress Notes (Signed)
Subjective:  Patient ID: Bridget Oliver, female    DOB: 27-Jun-1941  Age: 81 y.o. MRN: [id number]  CC: Follow-up   HPI Bridget Oliver presents for wt loss on diet, HTN, CRF  Outpatient Medications Prior to Visit  Medication Sig Dispense Refill   allopurinol (ZYLOPRIM) 100 MG tablet TAKE 1/2 TABLET BY MOUTH EVERY DAY 45 tablet 1   amLODipine (NORVASC) 10 MG tablet Take 1 tablet (10 mg total) by mouth daily. 90 tablet 3   AREXVY 120 MCG/0.5ML injection      aspirin EC 81 MG EC tablet Take 1 tablet (81 mg total) by mouth daily. Swallow whole. 30 tablet 11   atorvastatin (LIPITOR) 40 MG tablet TAKE 1 TABLET BY MOUTH EVERY DAY 90 tablet 1   carvedilol (COREG) 25 MG tablet Take 1 tablet (25 mg total) by mouth 2 (two) times daily with a meal. Follow-up appt due must see provider for future refills 180 tablet 0   Cholecalciferol (VITAMIN D3) 2000 UNITS capsule Take 1 capsule (2,000 Units total) by mouth daily. 100 capsule 3   Coenzyme Q10 (COQ10) 100 MG CAPS Take 1 capsule by mouth daily.     COMIRNATY syringe      Fish Oil-Cholecalciferol (FISH OIL + D3) 1200-1000 MG-UNIT CAPS Take 1 capsule by mouth daily.     FLUAD QUADRIVALENT 0.5 ML injection      furosemide (LASIX) 80 MG tablet TAKE 1 TABLET BY MOUTH EVERY DAY 90 tablet 1   hydrALAZINE (APRESOLINE) 25 MG tablet Take 25 mg by mouth 3 (three) times daily.     hydrALAZINE (APRESOLINE) 50 MG tablet Take 50 mg by mouth 3 (three) times daily.     KLOR-CON M20 20 MEQ tablet TAKE 1 TABLET BY MOUTH EVERY DAY 90 tablet 3   losartan (COZAAR) 100 MG tablet TAKE 1 TABLET BY MOUTH EVERYDAY AT BEDTIME 90 tablet 1   metroNIDAZOLE (FLAGYL) 500 MG tablet Take 1 tablet (500 mg total) by mouth 3 (three) times daily. 30 tablet 0   Multiple Vitamins-Minerals (CENTRUM SILVER PO) Take 1 tablet by mouth daily.      nitroGLYCERIN (NITROSTAT) 0.4 MG SL tablet Place 1 tablet (0.4 mg total) under the tongue every 5 (five) minutes as needed for chest pain. Reported on  05/28/2016 25 tablet 2   ondansetron (ZOFRAN) 4 MG tablet Take 1 tablet (4 mg total) by mouth every 8 (eight) hours as needed for nausea or vomiting. 30 tablet 0   pantoprazole (PROTONIX) 40 MG tablet TAKE 1 TABLET BY MOUTH EVERY DAY 90 tablet 3   Polyethyl Glycol-Propyl Glycol (SYSTANE FREE OP) Place 1 drop into both eyes daily as needed (dry eyes).     Probiotic Product (ALIGN) 4 MG CAPS Take 1 capsule (4 mg total) by mouth daily. 30 capsule 0   vitamin B-12 (CYANOCOBALAMIN) 1000 MCG tablet Take 500 mcg by mouth every other day.      ezetimibe (ZETIA) 10 MG tablet Take 1 tablet (10 mg total) by mouth daily. 90 tablet 3   minoxidil (LONITEN) 2.5 MG tablet Take 1 tablet (2.5 mg total) by mouth daily. (Patient not taking: Reported on 11/11/2022) 90 tablet 3   No facility-administered medications prior to visit.    ROS: Review of Systems  Constitutional:  Positive for unexpected weight change. Negative for activity change, appetite change, chills and fatigue.  HENT:  Negative for congestion, mouth sores and sinus pressure.   Eyes:  Negative for visual disturbance.  Respiratory:  Negative for cough and chest tightness.   Gastrointestinal:  Negative for abdominal pain and nausea.  Genitourinary:  Negative for difficulty urinating, frequency and vaginal pain.  Musculoskeletal:  Negative for back pain and gait problem.  Skin:  Negative for pallor and rash.  Neurological:  Negative for dizziness, tremors, weakness, numbness and headaches.  Psychiatric/Behavioral:  Negative for confusion and sleep disturbance.     Objective:  BP 120/64 (BP Location: Right Arm, Patient Position: Sitting, Cuff Size: Normal)   Pulse 65   Temp 98 F (36.7 C) (Oral)   Ht '5\' 3"'$  (1.6 m)   Wt 167 lb (75.8 kg)   SpO2 99%   BMI 29.58 kg/m   BP Readings from Last 3 Encounters:  11/11/22 120/64  06/15/22 (!) 138/40  06/10/22 (!) 119/50    Wt Readings from Last 3 Encounters:  11/11/22 167 lb (75.8 kg)  06/15/22  184 lb (83.5 kg)  06/10/22 191 lb 3.2 oz (86.7 kg)    Physical Exam Constitutional:      General: She is not in acute distress.    Appearance: She is well-developed. She is obese.  HENT:     Head: Normocephalic.     Right Ear: External ear normal.     Left Ear: External ear normal.     Nose: Nose normal.  Eyes:     General:        Right eye: No discharge.        Left eye: No discharge.     Conjunctiva/sclera: Conjunctivae normal.     Pupils: Pupils are equal, round, and reactive to light.  Neck:     Thyroid: No thyromegaly.     Vascular: No JVD.     Trachea: No tracheal deviation.  Cardiovascular:     Rate and Rhythm: Normal rate and regular rhythm.     Heart sounds: Normal heart sounds.  Pulmonary:     Effort: No respiratory distress.     Breath sounds: No stridor. No wheezing.  Abdominal:     General: Bowel sounds are normal. There is no distension.     Palpations: Abdomen is soft. There is no mass.     Tenderness: There is no abdominal tenderness. There is no guarding or rebound.  Musculoskeletal:        General: No tenderness.     Cervical back: Normal range of motion and neck supple. No rigidity.  Lymphadenopathy:     Cervical: No cervical adenopathy.  Skin:    Findings: No erythema or rash.  Neurological:     Cranial Nerves: No cranial nerve deficit.     Motor: No abnormal muscle tone.     Coordination: Coordination normal.     Deep Tendon Reflexes: Reflexes normal.  Psychiatric:        Behavior: Behavior normal.        Thought Content: Thought content normal.        Judgment: Judgment normal.     Lab Results  Component Value Date   WBC 10.1 06/15/2022   HGB 10.8 (L) 06/15/2022   HCT 32.5 (L) 06/15/2022   PLT 218.0 06/15/2022   GLUCOSE 114 (H) 06/15/2022   CHOL 101 06/05/2022   TRIG 74 06/05/2022   HDL 34 (L) 06/05/2022   LDLDIRECT 127.9 07/14/2011   LDLCALC 51 06/05/2022   ALT 12 06/15/2022   AST 16 06/15/2022   NA 140 06/15/2022   K 4.3  06/15/2022   CL 104 06/15/2022   CREATININE 1.98 (H)  06/15/2022   BUN 30 (H) 06/15/2022   CO2 26 06/15/2022   TSH 3.27 05/06/2021   INR 0.98 06/07/2014   HGBA1C 5.1 11/11/2022   MICROALBUR 1.9 04/29/2020    CT ABDOMEN PELVIS WO CONTRAST  Result Date: 06/25/2022 CLINICAL DATA:  Left lower quadrant abdominal pain. EXAM: CT ABDOMEN AND PELVIS WITHOUT CONTRAST TECHNIQUE: Multidetector CT imaging of the abdomen and pelvis was performed following the standard protocol without IV contrast. RADIATION DOSE REDUCTION: This exam was performed according to the departmental dose-optimization program which includes automated exposure control, adjustment of the mA and/or kV according to patient size and/or use of iterative reconstruction technique. COMPARISON:  Ultrasound kidneys 04/14/2022. Ultrasound abdomen 04/01/2011. MRI pelvis 12/10/2005 FINDINGS: Lower chest: Lung bases are clear.  Small esophageal hiatal hernia. Hepatobiliary: Cholelithiasis with a single radiopaque stone demonstrated in the gallbladder. No inflammatory changes. No bile duct dilatation. No focal liver lesions. Pancreas: Unremarkable. No pancreatic ductal dilatation or surrounding inflammatory changes. Spleen: Normal in size without focal abnormality. Adrenals/Urinary Tract: Adrenal glands are unremarkable. Kidneys are normal, without renal calculi, focal lesion, or hydronephrosis. Bladder is unremarkable. Stomach/Bowel: Stomach, small bowel, and colon are not abnormally distended. Contrast material flows through to the rectum without evidence of obstruction. Diverticulosis of the sigmoid colon. Minimal inflammatory changes are demonstrated in the fat around the mid sigmoid region suggesting possible early acute diverticulitis changes. No abscess or perforation. Appendix is not identified. Vascular/Lymphatic: Extensive aortic atherosclerosis. Probably at least moderate calcific stenosis of the infrarenal abdominal aorta and iliac artery origins.  No significant lymphadenopathy. IVC appears normal in caliber. Reproductive: Surgical absence of the uterus. Ovaries are not enlarged. Other: No free air or free fluid in the abdomen. Tiny periumbilical hernia containing fat. Musculoskeletal: Degenerative changes.  No acute bony abnormalities. IMPRESSION: 1. Diverticulosis of the sigmoid colon with focal area of slight pericolonic infiltration, possibly early acute diverticulitis. No abscess. 2. Cholelithiasis without evidence of acute cholecystitis. 3. Extensive aortic atherosclerosis with at least moderate calcific stenosis demonstrated in the abdominal aorta and iliac artery origins. Electronically Signed   By: Lucienne Capers M.D.   On: 06/25/2022 19:45    Assessment & Plan:   Problem List Items Addressed This Visit     OBESITY, MORBID    Wt Readings from Last 3 Encounters:  11/11/22 167 lb (75.8 kg)  06/15/22 184 lb (83.5 kg)  06/10/22 191 lb 3.2 oz (86.7 kg)  On diet       Hypertensive renal disease    Cont w/Hydralazine, Furosemide, Coreg, Losartan F/u w/Dr Laurena Bering Off Minoxidil      Relevant Orders   TSH   CBC with Differential/Platelet   Comprehensive metabolic panel   Hypertensive cardiomegaly    On Losartan, Coreg, Furosemide, Hydralazine Off Minoxidil  Pt lost wt      Relevant Orders   TSH   CBC with Differential/Platelet   Comprehensive metabolic panel   Hypertension    On Losartan, Coreg, Furosemide Off Minoxidil      Dyslipidemia   Relevant Orders   Lipid panel   DM (diabetes mellitus), type 2 with renal complications ([place]) - Primary    On diet      Relevant Orders   POCT HgB A1C (Completed)   TSH   CBC with Differential/Platelet   Comprehensive metabolic panel   Lipid panel      No orders of the defined types were placed in this encounter.     Follow-up: Return in about 6 months (around  05/13/2023) for a follow-up visit.  Walker Kehr, MD

## 2022-11-11 NOTE — Assessment & Plan Note (Signed)
Wt Readings from Last 3 Encounters:  11/11/22 167 lb (75.8 kg)  06/15/22 184 lb (83.5 kg)  06/10/22 191 lb 3.2 oz (86.7 kg)  On diet

## 2022-11-11 NOTE — Assessment & Plan Note (Signed)
On Losartan, Coreg, Furosemide, Hydralazine Off Minoxidil  Pt lost wt

## 2022-11-11 NOTE — Assessment & Plan Note (Signed)
On Losartan, Coreg, Furosemide Off Minoxidil

## 2023-01-20 ENCOUNTER — Other Ambulatory Visit: Payer: Self-pay | Admitting: Internal Medicine

## 2023-01-26 ENCOUNTER — Encounter: Payer: Self-pay | Admitting: Podiatry

## 2023-01-26 ENCOUNTER — Ambulatory Visit: Payer: Medicare HMO | Admitting: Podiatry

## 2023-01-26 VITALS — BP 219/62

## 2023-01-26 DIAGNOSIS — M79675 Pain in left toe(s): Secondary | ICD-10-CM | POA: Diagnosis not present

## 2023-01-26 DIAGNOSIS — M79674 Pain in right toe(s): Secondary | ICD-10-CM

## 2023-01-26 DIAGNOSIS — N1831 Chronic kidney disease, stage 3a: Secondary | ICD-10-CM

## 2023-01-26 DIAGNOSIS — B351 Tinea unguium: Secondary | ICD-10-CM

## 2023-01-26 DIAGNOSIS — E1122 Type 2 diabetes mellitus with diabetic chronic kidney disease: Secondary | ICD-10-CM | POA: Diagnosis not present

## 2023-01-26 NOTE — Progress Notes (Unsigned)
  Subjective:  Patient ID: Bridget Oliver, female    DOB: 1941/07/13,  MRN: [id number]  Bridget LOMBARDI presents to clinic today for {jgcomplaint:23593}  Chief Complaint  Patient presents with   Nail Problem    [facility] PCP-[name] PCP VST-10/2022   New problem(s): None. {jgcomplaint:23593}  PCP is [name], [name], MD.  Allergies  Allergen Reactions   Oxycodone     Nausea Can take codein    Review of Systems: Negative except as noted in the HPI.  Objective: No changes noted in today's physical examination. There were no vitals filed for this visit. Bridget Oliver is a pleasant 82 y.o. female {jgbodyhabitus:24098} AAO x 3.  Vascular Examination: Capillary refill time immediate b/l.Vascular status intact b/l with palpable pedal pulses. Pedal hair sparse b/l. No edema. No pain with calf compression b/l. Skin temperature gradient WNL b/l.   Neurological Examination: Sensation grossly intact b/l with 10 gram monofilament. Vibratory sensation intact b/l.   Dermatological Examination: Pedal skin with normal turgor, texture and tone b/l. Toenails 1-5 b/l thick, discolored, elongated with subungual debris and pain on dorsal palpation. No open wounds b/l LE. No interdigital macerations noted b/l LE. No hyperkeratotic nor porokeratotic lesions present on today's visit.  Musculoskeletal Examination: Normal muscle strength 5/5 to all lower extremity muscle groups bilaterally. HAV with bunion deformity noted b/l LE. Hammertoe(s) noted to the left second digit.Marland Kitchen No pain, crepitus or joint limitation noted with ROM b/l LE.  Patient ambulates independently without assistive aids.  Assessment/Plan: 1. Pain due to onychomycosis of toenails of both feet   2. Type 2 diabetes mellitus with stage 3a chronic kidney disease, without long-term current use of insulin (HCC)    {Jgplan:23602::"-Patient/POA to call should there be question/concern in the interim."}   Return in about 3 months (around  04/28/2023).  Marzetta Board, DPM

## 2023-01-28 DIAGNOSIS — I129 Hypertensive chronic kidney disease with stage 1 through stage 4 chronic kidney disease, or unspecified chronic kidney disease: Secondary | ICD-10-CM | POA: Diagnosis not present

## 2023-01-28 DIAGNOSIS — N184 Chronic kidney disease, stage 4 (severe): Secondary | ICD-10-CM | POA: Diagnosis not present

## 2023-01-28 DIAGNOSIS — N2581 Secondary hyperparathyroidism of renal origin: Secondary | ICD-10-CM | POA: Diagnosis not present

## 2023-01-28 DIAGNOSIS — Z8739 Personal history of other diseases of the musculoskeletal system and connective tissue: Secondary | ICD-10-CM | POA: Diagnosis not present

## 2023-01-28 DIAGNOSIS — E1122 Type 2 diabetes mellitus with diabetic chronic kidney disease: Secondary | ICD-10-CM | POA: Diagnosis not present

## 2023-01-28 DIAGNOSIS — E1129 Type 2 diabetes mellitus with other diabetic kidney complication: Secondary | ICD-10-CM | POA: Diagnosis not present

## 2023-01-29 LAB — LAB REPORT - SCANNED
A1c: 5.5
EGFR: 42

## 2023-01-30 ENCOUNTER — Other Ambulatory Visit: Payer: Self-pay | Admitting: Internal Medicine

## 2023-02-10 DIAGNOSIS — I739 Peripheral vascular disease, unspecified: Secondary | ICD-10-CM | POA: Diagnosis not present

## 2023-02-10 DIAGNOSIS — I129 Hypertensive chronic kidney disease with stage 1 through stage 4 chronic kidney disease, or unspecified chronic kidney disease: Secondary | ICD-10-CM | POA: Diagnosis not present

## 2023-02-10 DIAGNOSIS — E876 Hypokalemia: Secondary | ICD-10-CM | POA: Diagnosis not present

## 2023-02-10 DIAGNOSIS — Z008 Encounter for other general examination: Secondary | ICD-10-CM | POA: Diagnosis not present

## 2023-02-10 DIAGNOSIS — Z7982 Long term (current) use of aspirin: Secondary | ICD-10-CM | POA: Diagnosis not present

## 2023-02-10 DIAGNOSIS — M2010 Hallux valgus (acquired), unspecified foot: Secondary | ICD-10-CM | POA: Diagnosis not present

## 2023-02-10 DIAGNOSIS — N189 Chronic kidney disease, unspecified: Secondary | ICD-10-CM | POA: Diagnosis not present

## 2023-02-10 DIAGNOSIS — R609 Edema, unspecified: Secondary | ICD-10-CM | POA: Diagnosis not present

## 2023-02-10 DIAGNOSIS — K219 Gastro-esophageal reflux disease without esophagitis: Secondary | ICD-10-CM | POA: Diagnosis not present

## 2023-02-10 DIAGNOSIS — E785 Hyperlipidemia, unspecified: Secondary | ICD-10-CM | POA: Diagnosis not present

## 2023-02-10 DIAGNOSIS — M1030 Gout due to renal impairment, unspecified site: Secondary | ICD-10-CM | POA: Diagnosis not present

## 2023-02-24 ENCOUNTER — Other Ambulatory Visit: Payer: Self-pay | Admitting: Internal Medicine

## 2023-02-27 ENCOUNTER — Other Ambulatory Visit: Payer: Self-pay | Admitting: Internal Medicine

## 2023-02-28 ENCOUNTER — Other Ambulatory Visit: Payer: Self-pay | Admitting: Internal Medicine

## 2023-03-16 ENCOUNTER — Other Ambulatory Visit: Payer: Self-pay | Admitting: Internal Medicine

## 2023-03-17 DIAGNOSIS — I129 Hypertensive chronic kidney disease with stage 1 through stage 4 chronic kidney disease, or unspecified chronic kidney disease: Secondary | ICD-10-CM | POA: Diagnosis not present

## 2023-03-17 DIAGNOSIS — N2581 Secondary hyperparathyroidism of renal origin: Secondary | ICD-10-CM | POA: Diagnosis not present

## 2023-03-17 DIAGNOSIS — E1122 Type 2 diabetes mellitus with diabetic chronic kidney disease: Secondary | ICD-10-CM | POA: Diagnosis not present

## 2023-03-17 DIAGNOSIS — N184 Chronic kidney disease, stage 4 (severe): Secondary | ICD-10-CM | POA: Diagnosis not present

## 2023-03-17 DIAGNOSIS — Z8739 Personal history of other diseases of the musculoskeletal system and connective tissue: Secondary | ICD-10-CM | POA: Diagnosis not present

## 2023-03-22 ENCOUNTER — Telehealth: Payer: Self-pay | Admitting: Internal Medicine

## 2023-03-22 NOTE — Telephone Encounter (Signed)
*  STAT* If patient is at the pharmacy, call can be transferred to refill team.   1. Which medications need to be refilled? (please list name of each medication and dose if known)   ezetimibe (ZETIA) 10 MG tablet   2. Which pharmacy/location (including street and city if local pharmacy) is medication to be sent to?  [id number] - [place], [place] - [address]   3. Do they need a 30 day or 90 day supply?   90 day   Patient stated she has a little medication left. Patient has visit scheduled on 6/12.

## 2023-03-22 NOTE — Telephone Encounter (Signed)
Prescription Request  03/22/2023  LOV: 11/11/2022  What is the name of the medication or equipment?  KLOR-CON M20 20 MEQ tablet  Have you contacted your pharmacy to request a refill? Yes   Which pharmacy would you like this sent to?  [id number] [place], [place] - [address] [place] [address] Phone: [phone] Fax: [phone]    Patient notified that their request is being sent to the clinical staff for review and that they should receive a response within 2 business days.   Please advise at Mobile [phone] (mobile)    Next OV is 05/17/23.

## 2023-03-23 ENCOUNTER — Other Ambulatory Visit: Payer: Self-pay

## 2023-03-23 ENCOUNTER — Telehealth: Payer: Self-pay

## 2023-03-23 MED ORDER — EZETIMIBE 10 MG PO TABS: 10.0000 mg | ORAL_TABLET | Freq: Every day | ORAL | 3 refills | Status: DC

## 2023-03-23 MED ORDER — POTASSIUM CHLORIDE CRYS ER 20 MEQ PO TBCR: 20.0000 meq | EXTENDED_RELEASE_TABLET | Freq: Every day | ORAL | 3 refills | Status: DC

## 2023-03-23 NOTE — Telephone Encounter (Signed)
Called patient to schedule Medicare Annual Wellness Visit (AWV). Unable to reach patient.  Last date of AWV: 06/14/20  Please schedule an appointment at any time On Annual Wellness Visit Schedule.

## 2023-03-23 NOTE — Telephone Encounter (Signed)
Called patient to advise medication refill sent to pharmacy . Left message for patient. ?

## 2023-04-29 ENCOUNTER — Other Ambulatory Visit: Payer: Self-pay | Admitting: Internal Medicine

## 2023-05-03 ENCOUNTER — Other Ambulatory Visit: Payer: Self-pay | Admitting: Internal Medicine

## 2023-05-03 NOTE — Progress Notes (Signed)
Cardiology Office Note:    Date:  05/05/2023   ID:  Bridget Oliver, DOB Sep 11, 1941, MRN [id number]  PCP:  Tresa Garter, MD [facility] Cardiologist: Chrystie Nose, MD   Reason for visit: 6 month follow-up  History of Present Illness:    Bridget Oliver is a 82 y.o. female with a hx of hyperlipidemia, hypertension, diabetes, PAD and atypical chest pain with negative stress test.     I last saw the patient in July 2023.  Patient was feeling well.  Her blood pressure had better stabilized and is comanaged with nephrology.  Patient is doing well today, about to celebrate her 82st birthday.  Since her last visit, she states she is now off minoxidil.  Her hydralazine has gone from 75 mg to 100 mg 3 times a day.  Blood pressure overall stable.  She denies chest pain, shortness of breath, palpitations, lightheadedness, PND, orthopnea and lower extremity edema.  No bleeding with aspirin 81 mg daily.  She denies symptoms of TIA/CVA.  She notices calf claudication if she is on an incline.  She denies claudication just walking around the house or walking in stores.  She denies wounds, color changes or numbness in her legs.     Past Medical History:  Diagnosis Date   CRI (chronic renal insufficiency)    Stage 3   Diabetes mellitus 2011   Type II diet controlled   Diarrhea    GERD (gastroesophageal reflux disease)    Gout    Hyperglycemia    Hyperlipidemia    Hypertension    Lactose intolerance    Macular degeneration    Obesity    Swelling of lower limb     Past Surgical History:  Procedure Laterality Date   ABDOMINAL HYSTERECTOMY  1985   APPENDECTOMY  1985   CATARACT EXTRACTION  2010 & 2011   bilateral   CESAREAN SECTION  1969   LUMBAR LAMINECTOMY  2007   PARS PLANA VITRECTOMY W/ REPAIR OF MACULAR HOLE  2010 & 2011   Bilateral    Current Medications: Current Meds  Medication Sig   allopurinol (ZYLOPRIM) 100 MG tablet TAKE 1/2 TABLET BY MOUTH DAILY   amLODipine  (NORVASC) 10 MG tablet Take 1 tablet (10 mg total) by mouth daily.   AREXVY 120 MCG/0.5ML injection    aspirin EC 81 MG EC tablet Take 1 tablet (81 mg total) by mouth daily. Swallow whole.   atorvastatin (LIPITOR) 40 MG tablet TAKE 1 TABLET BY MOUTH EVERY DAY   carvedilol (COREG) 25 MG tablet Take 1 tablet (25 mg total) by mouth 2 (two) times daily with a meal.   Cholecalciferol (VITAMIN D3) 2000 UNITS capsule Take 1 capsule (2,000 Units total) by mouth daily.   Coenzyme Q10 (COQ10) 100 MG CAPS Take 1 capsule by mouth daily.   COMIRNATY syringe    ezetimibe (ZETIA) 10 MG tablet Take 1 tablet (10 mg total) by mouth daily.   Fish Oil-Cholecalciferol (FISH OIL + D3) 1200-1000 MG-UNIT CAPS Take 1 capsule by mouth daily.   FLUAD QUADRIVALENT 0.5 ML injection    furosemide (LASIX) 80 MG tablet TAKE 1 TABLET BY MOUTH EVERY DAY   hydrALAZINE (APRESOLINE) 100 MG tablet Take 100 mg by mouth 3 (three) times daily.   losartan (COZAAR) 100 MG tablet TAKE 1 TABLET BY MOUTH EVERYDAY AT BEDTIME   metroNIDAZOLE (FLAGYL) 500 MG tablet Take 1 tablet (500 mg total) by mouth 3 (three) times daily.   Multiple  Vitamins-Minerals (CENTRUM SILVER PO) Take 1 tablet by mouth daily.    nitroGLYCERIN (NITROSTAT) 0.4 MG SL tablet Place 1 tablet (0.4 mg total) under the tongue every 5 (five) minutes as needed for chest pain. Reported on 05/28/2016   ondansetron (ZOFRAN) 4 MG tablet Take 1 tablet (4 mg total) by mouth every 8 (eight) hours as needed for nausea or vomiting.   pantoprazole (PROTONIX) 40 MG tablet TAKE 1 TABLET BY MOUTH EVERY DAY   Polyethyl Glycol-Propyl Glycol (SYSTANE FREE OP) Place 1 drop into both eyes daily as needed (dry eyes).   potassium chloride SA (KLOR-CON M20) 20 MEQ tablet Take 1 tablet (20 mEq total) by mouth daily.   Probiotic Product (ALIGN) 4 MG CAPS Take 1 capsule (4 mg total) by mouth daily.   vitamin B-12 (CYANOCOBALAMIN) 1000 MCG tablet Take 500 mcg by mouth every other day.      Allergies:    Oxycodone   Social History   Socioeconomic History   Marital status: Married    Spouse name: Not on file   Number of children: Not on file   Years of education: Not on file   Highest education level: Not on file  Occupational History   Not on file  Tobacco Use   Smoking status: Never   Smokeless tobacco: Never  Substance and Sexual Activity   Alcohol use: Yes    Alcohol/week: 0.0 standard drinks of alcohol    Comment: rarely   Drug use: No   Sexual activity: Not on file  Other Topics Concern   Not on file  Social History Narrative   Not on file   Social Determinants of Health   Financial Resource Strain: Low Risk  (06/14/2020)   Overall Financial Resource Strain (CARDIA)    Difficulty of Paying Living Expenses: Not hard at all  Food Insecurity: No Food Insecurity (06/14/2020)   Hunger Vital Sign    Worried About Running Out of Food in the Last Year: Never true    Ran Out of Food in the Last Year: Never true  Transportation Needs: No Transportation Needs (06/14/2020)   PRAPARE - Administrator, Civil Service (Medical): No    Lack of Transportation (Non-Medical): No  Physical Activity: Not on file  Stress: No Stress Concern Present (06/14/2020)   [employer] of Occupational Health - Occupational Stress Questionnaire    Feeling of Stress : Not at all  Social Connections: Socially Integrated (06/14/2020)   Social Connection and Isolation Panel [NHANES]    Frequency of Communication with Friends and Family: More than three times a week    Frequency of Social Gatherings with Friends and Family: More than three times a week    Attends Religious Services: More than 4 times per year    Active Member of [employer] or Organizations: Yes    Attends Engineer, structural: More than 4 times per year    Marital Status: Married     Family History: The patient's family history includes Cancer in an other family member; Hypertension in her father and mother. There  is no history of Colon cancer, Esophageal cancer, Stomach cancer, or Rectal cancer.  ROS:   Please see the history of present illness.     EKGs/Labs/Other Studies Reviewed:    EKG:  The ekg ordered today demonstrates normal sinus rhythm with first-degree AV block, heart rate 61.  Recent Labs: 06/15/2022: ALT 12; BUN 30; Creatinine, Ser 1.98; Hemoglobin 10.8; Platelets 218.0; Potassium 4.3; Sodium 140  Recent Lipid Panel Lab Results  Component Value Date/Time   CHOL 101 06/05/2022 09:47 AM   TRIG 74 06/05/2022 09:47 AM   HDL 34 (L) 06/05/2022 09:47 AM   LDLCALC 51 06/05/2022 09:47 AM   LDLDIRECT 127.9 07/14/2011 07:47 AM    Physical Exam:    VS:  BP (!) 122/54 (BP Location: Left Arm, Patient Position: Sitting, Cuff Size: Normal)   Pulse 61   Ht 5\' 3"  (1.6 m)   Wt 167 lb (75.8 kg)   SpO2 99%   BMI 29.58 kg/m    No data found.   Wt Readings from Last 3 Encounters:  05/05/23 167 lb (75.8 kg)  11/11/22 167 lb (75.8 kg)  06/15/22 184 lb (83.5 kg)     GEN:  Well nourished, well developed in no acute distress HEENT: Normal NECK: No JVD; L > R carotid bruit CARDIAC: RRR, no murmurs, rubs, gallops RESPIRATORY:  Clear to auscultation without rales, wheezing or rhonchi  ABDOMEN: Soft, non-tender, non-distended MUSCULOSKELETAL: No edema SKIN: Warm and dry NEUROLOGIC:  Alert and oriented PSYCHIATRIC:  Normal affect     ASSESSMENT AND PLAN   Carotid artery stenosis, asymptomatic -Carotid duplex 2021 with moderate left ICA disease, mild right ICA disease -Denies CVA and TIA symptoms -Continue aspirin and lipid therapy. -Check carotid duplex in 1 year.   Precordial pain, resolved  -Myoview stress test neg in 09/2021 -Denies chest pain.   PAD, stable -hx of claudication and bilateral mod PAD on duplex -Has calf claudication if on incline -stable -Continue aspirin and lipid therapy -Advised patient to let us know if claudication worsens.   Hypertension, well  controlled -co-managed with nephrology -Continue current medications. -Goal BP is <130/80.  Recommend DASH diet (high in vegetables, fruits, low-fat dairy products, whole grains, poultry, fish, and nuts and low in sweets, sugar-sweetened beverages, and red meats), salt restriction and increase physical activity.   Hyperlipidemia, well controlled -Lipids July 2023 with LDL 51 --> recheck fasting lipids. -Continue Lipitor and Zetia   Disposition - Follow-up in 1 year.  Medication Adjustments/Labs and Tests Ordered: Current medicines are reviewed at length with the patient today.  Concerns regarding medicines are outlined above.  Orders Placed This Encounter  Procedures   Lipid panel   EKG 12-Lead   No orders of the defined types were placed in this encounter.   Patient Instructions  Medication Instructions:  No Changes *If you need a refill on your cardiac medications before your next appointment, please call your pharmacy*   Lab Work: Lipid Panel : Today If you have labs (blood work) drawn today and your tests are completely normal, you will receive your results only by: MyChart Message (if you have MyChart) OR A paper copy in the mail If you have any lab test that is abnormal or we need to change your treatment, we will call you to review the results.   Testing/Procedures: No Testing   Follow-Up: At [facility], you and your health needs are our priority.  As part of our continuing mission to provide you with exceptional heart care, we have created designated Provider Care Teams.  These Care Teams include your primary Cardiologist (physician) and Advanced Practice Providers (APPs -  Physician Assistants and Nurse Practitioners) who all work together to provide you with the care you need, when you need it.  We recommend signing up for the patient portal called "MyChart".  Sign up information is provided on this After Visit Summary.  MyChart is  used to connect with  patients for Virtual Visits (Telemedicine).  Patients are able to view lab/test results, encounter notes, upcoming appointments, etc.  Non-urgent messages can be sent to your provider as well.   To learn more about what you can do with MyChart, go to [email or web address].    Your next appointment:   1 year(s)  Provider:   Chrystie Nose, MD     Signed, Cannon Kettle, PA-C  05/05/2023 9:33 AM    [facility]

## 2023-05-05 ENCOUNTER — Encounter: Payer: Self-pay | Admitting: Physician Assistant

## 2023-05-05 ENCOUNTER — Ambulatory Visit: Payer: Medicare HMO | Attending: Physician Assistant | Admitting: Physician Assistant

## 2023-05-05 VITALS — BP 122/54 | HR 61 | Ht 63.0 in | Wt 167.0 lb

## 2023-05-05 DIAGNOSIS — I6523 Occlusion and stenosis of bilateral carotid arteries: Secondary | ICD-10-CM

## 2023-05-05 DIAGNOSIS — I739 Peripheral vascular disease, unspecified: Secondary | ICD-10-CM | POA: Diagnosis not present

## 2023-05-05 DIAGNOSIS — I10 Essential (primary) hypertension: Secondary | ICD-10-CM

## 2023-05-05 DIAGNOSIS — E785 Hyperlipidemia, unspecified: Secondary | ICD-10-CM | POA: Diagnosis not present

## 2023-05-05 NOTE — Patient Instructions (Signed)
Medication Instructions:  No Changes *If you need a refill on your cardiac medications before your next appointment, please call your pharmacy*   Lab Work: Lipid Panel : Today If you have labs (blood work) drawn today and your tests are completely normal, you will receive your results only by: MyChart Message (if you have MyChart) OR A paper copy in the mail If you have any lab test that is abnormal or we need to change your treatment, we will call you to review the results.   Testing/Procedures: No Testing   Follow-Up: At [facility], you and your health needs are our priority.  As part of our continuing mission to provide you with exceptional heart care, we have created designated Provider Care Teams.  These Care Teams include your primary Cardiologist (physician) and Advanced Practice Providers (APPs -  Physician Assistants and Nurse Practitioners) who all work together to provide you with the care you need, when you need it.  We recommend signing up for the patient portal called "MyChart".  Sign up information is provided on this After Visit Summary.  MyChart is used to connect with patients for Virtual Visits (Telemedicine).  Patients are able to view lab/test results, encounter notes, upcoming appointments, etc.  Non-urgent messages can be sent to your provider as well.   To learn more about what you can do with MyChart, go to [email or web address].    Your next appointment:   1 year(s)  Provider:   Chrystie Nose, MD

## 2023-05-06 LAB — LIPID PANEL
Chol/HDL Ratio: 2.5 ratio (ref 0.0–4.4)
Cholesterol, Total: 109 mg/dL (ref 100–199)
HDL: 44 mg/dL (ref >39)
LDL Chol Calc (NIH): 51 mg/dL (ref 0–99)
Triglycerides: 61 mg/dL (ref 0–149)
VLDL Cholesterol Cal: 14 mg/dL (ref 5–40)

## 2023-05-07 DIAGNOSIS — N1832 Chronic kidney disease, stage 3b: Secondary | ICD-10-CM | POA: Diagnosis not present

## 2023-05-07 DIAGNOSIS — N2581 Secondary hyperparathyroidism of renal origin: Secondary | ICD-10-CM | POA: Diagnosis not present

## 2023-05-07 DIAGNOSIS — E1122 Type 2 diabetes mellitus with diabetic chronic kidney disease: Secondary | ICD-10-CM | POA: Diagnosis not present

## 2023-05-07 DIAGNOSIS — I129 Hypertensive chronic kidney disease with stage 1 through stage 4 chronic kidney disease, or unspecified chronic kidney disease: Secondary | ICD-10-CM | POA: Diagnosis not present

## 2023-05-07 DIAGNOSIS — Z8739 Personal history of other diseases of the musculoskeletal system and connective tissue: Secondary | ICD-10-CM | POA: Diagnosis not present

## 2023-05-12 DIAGNOSIS — N184 Chronic kidney disease, stage 4 (severe): Secondary | ICD-10-CM | POA: Diagnosis not present

## 2023-05-13 ENCOUNTER — Telehealth: Payer: Self-pay

## 2023-05-13 NOTE — Telephone Encounter (Addendum)
Called patient regarding results. Left message for patient to call office.----- Message from Cannon Kettle, PA-C sent at 05/12/2023 10:02 AM EDT ----- Cholesterol continue to be well-controlled.  LDL 51 (goal less than 70).  HDL (good cholesterol) and triglycerides improved. -Continue Lipitor and Zetia

## 2023-05-14 ENCOUNTER — Other Ambulatory Visit: Payer: Self-pay | Admitting: Internal Medicine

## 2023-05-14 DIAGNOSIS — Z1231 Encounter for screening mammogram for malignant neoplasm of breast: Secondary | ICD-10-CM | POA: Diagnosis not present

## 2023-05-14 LAB — HM MAMMOGRAPHY

## 2023-05-17 ENCOUNTER — Encounter: Payer: Self-pay | Admitting: Internal Medicine

## 2023-05-17 ENCOUNTER — Ambulatory Visit (INDEPENDENT_AMBULATORY_CARE_PROVIDER_SITE_OTHER): Payer: Medicare HMO | Admitting: Internal Medicine

## 2023-05-17 VITALS — BP 130/56 | HR 78 | Temp 98.6°F | Ht 63.0 in | Wt 169.0 lb

## 2023-05-17 DIAGNOSIS — E1122 Type 2 diabetes mellitus with diabetic chronic kidney disease: Secondary | ICD-10-CM

## 2023-05-17 DIAGNOSIS — N1831 Chronic kidney disease, stage 3a: Secondary | ICD-10-CM | POA: Diagnosis not present

## 2023-05-17 DIAGNOSIS — E782 Mixed hyperlipidemia: Secondary | ICD-10-CM

## 2023-05-17 DIAGNOSIS — I10 Essential (primary) hypertension: Secondary | ICD-10-CM | POA: Diagnosis not present

## 2023-05-17 DIAGNOSIS — N184 Chronic kidney disease, stage 4 (severe): Secondary | ICD-10-CM | POA: Diagnosis not present

## 2023-05-17 NOTE — Assessment & Plan Note (Signed)
Cont on Losartan, Hydralazine

## 2023-05-17 NOTE — Assessment & Plan Note (Signed)
  On diet  

## 2023-05-17 NOTE — Assessment & Plan Note (Addendum)
Cont w/Hydralazine, Losartan, Furosemide F/u w/Dr Casimiro Needle

## 2023-05-17 NOTE — Progress Notes (Signed)
Subjective:  Patient ID: Bridget Oliver, female    DOB: 10-30-1941  Age: 82 y.o. MRN: [id number]  CC: Follow-up (6 mnth f/u)   HPI TENNIE GRUSSING presents for CRF, HTN, dyslipidemia Pt is able to walk 1-2 miles  Outpatient Medications Prior to Visit  Medication Sig Dispense Refill   allopurinol (ZYLOPRIM) 100 MG tablet TAKE 1/2 TABLET BY MOUTH DAILY 45 tablet 0   amLODipine (NORVASC) 10 MG tablet Take 1 tablet (10 mg total) by mouth daily. 90 tablet 3   AREXVY 120 MCG/0.5ML injection      aspirin EC 81 MG EC tablet Take 1 tablet (81 mg total) by mouth daily. Swallow whole. 30 tablet 11   atorvastatin (LIPITOR) 40 MG tablet TAKE 1 TABLET BY MOUTH EVERY DAY 90 tablet 1   carvedilol (COREG) 25 MG tablet Take 1 tablet (25 mg total) by mouth 2 (two) times daily with a meal. 180 tablet 1   Cholecalciferol (VITAMIN D3) 2000 UNITS capsule Take 1 capsule (2,000 Units total) by mouth daily. 100 capsule 3   Coenzyme Q10 (COQ10) 100 MG CAPS Take 1 capsule by mouth daily.     COMIRNATY syringe      ezetimibe (ZETIA) 10 MG tablet Take 1 tablet (10 mg total) by mouth daily. 30 tablet 3   Fish Oil-Cholecalciferol (FISH OIL + D3) 1200-1000 MG-UNIT CAPS Take 1 capsule by mouth daily.     FLUAD QUADRIVALENT 0.5 ML injection      furosemide (LASIX) 80 MG tablet TAKE 1 TABLET BY MOUTH EVERY DAY 90 tablet 0   hydrALAZINE (APRESOLINE) 100 MG tablet Take 100 mg by mouth 3 (three) times daily.     losartan (COZAAR) 100 MG tablet TAKE 1 TABLET BY MOUTH EVERYDAY AT BEDTIME 90 tablet 1   metroNIDAZOLE (FLAGYL) 500 MG tablet Take 1 tablet (500 mg total) by mouth 3 (three) times daily. 30 tablet 0   Multiple Vitamins-Minerals (CENTRUM SILVER PO) Take 1 tablet by mouth daily.      nitroGLYCERIN (NITROSTAT) 0.4 MG SL tablet Place 1 tablet (0.4 mg total) under the tongue every 5 (five) minutes as needed for chest pain. Reported on 05/28/2016 25 tablet 2   ondansetron (ZOFRAN) 4 MG tablet Take 1 tablet (4 mg total) by mouth  every 8 (eight) hours as needed for nausea or vomiting. 30 tablet 0   pantoprazole (PROTONIX) 40 MG tablet TAKE 1 TABLET BY MOUTH EVERY DAY 90 tablet 3   Polyethyl Glycol-Propyl Glycol (SYSTANE FREE OP) Place 1 drop into both eyes daily as needed (dry eyes).     potassium chloride SA (KLOR-CON M20) 20 MEQ tablet Take 1 tablet (20 mEq total) by mouth daily. 90 tablet 3   Probiotic Product (ALIGN) 4 MG CAPS Take 1 capsule (4 mg total) by mouth daily. 30 capsule 0   vitamin B-12 (CYANOCOBALAMIN) 1000 MCG tablet Take 500 mcg by mouth every other day.      hydrALAZINE (APRESOLINE) 25 MG tablet Take 25 mg by mouth 3 (three) times daily. (Patient not taking: Reported on 05/05/2023)     hydrALAZINE (APRESOLINE) 50 MG tablet Take 50 mg by mouth 3 (three) times daily. (Patient not taking: Reported on 05/05/2023)     minoxidil (LONITEN) 2.5 MG tablet Take 1 tablet (2.5 mg total) by mouth daily. (Patient not taking: Reported on 05/05/2023) 90 tablet 3   No facility-administered medications prior to visit.    ROS: Review of Systems  Constitutional:  Positive for fatigue. Negative  for activity change, appetite change, chills and unexpected weight change.  HENT:  Negative for congestion, mouth sores and sinus pressure.   Eyes:  Negative for visual disturbance.  Respiratory:  Negative for cough and chest tightness.   Gastrointestinal:  Negative for abdominal pain and nausea.  Genitourinary:  Negative for difficulty urinating, frequency and vaginal pain.  Musculoskeletal:  Positive for arthralgias. Negative for back pain and gait problem.  Skin:  Negative for pallor and rash.  Neurological:  Negative for dizziness, tremors, weakness, numbness and headaches.  Psychiatric/Behavioral:  Negative for confusion and sleep disturbance.     Objective:  BP (!) 130/56 (BP Location: Left Arm, Patient Position: Sitting, Cuff Size: Normal)   Pulse 78   Temp 98.6 F (37 C) (Oral)   Ht 5\' 3"  (1.6 m)   Wt 169 lb (76.7  kg)   SpO2 95%   BMI 29.94 kg/m   BP Readings from Last 3 Encounters:  05/17/23 (!) 130/56  05/05/23 (!) 122/54  01/26/23 (!) 219/62    Wt Readings from Last 3 Encounters:  05/17/23 169 lb (76.7 kg)  05/05/23 167 lb (75.8 kg)  11/11/22 167 lb (75.8 kg)    Physical Exam Constitutional:      General: She is not in acute distress.    Appearance: She is well-developed. She is obese.  HENT:     Head: Normocephalic.     Right Ear: External ear normal.     Left Ear: External ear normal.     Nose: Nose normal.  Eyes:     General:        Right eye: No discharge.        Left eye: No discharge.     Conjunctiva/sclera: Conjunctivae normal.     Pupils: Pupils are equal, round, and reactive to light.  Neck:     Thyroid: No thyromegaly.     Vascular: No JVD.     Trachea: No tracheal deviation.  Cardiovascular:     Rate and Rhythm: Normal rate and regular rhythm.     Heart sounds: Normal heart sounds.  Pulmonary:     Effort: No respiratory distress.     Breath sounds: No stridor. No wheezing.  Abdominal:     General: Bowel sounds are normal. There is no distension.     Palpations: Abdomen is soft. There is no mass.     Tenderness: There is no abdominal tenderness. There is no guarding or rebound.  Musculoskeletal:        General: No tenderness.     Cervical back: Normal range of motion and neck supple. No rigidity.     Right lower leg: No edema.     Left lower leg: No edema.  Lymphadenopathy:     Cervical: No cervical adenopathy.  Skin:    Findings: No erythema or rash.  Neurological:     Cranial Nerves: No cranial nerve deficit.     Motor: No abnormal muscle tone.     Coordination: Coordination normal.     Deep Tendon Reflexes: Reflexes normal.  Psychiatric:        Behavior: Behavior normal.        Thought Content: Thought content normal.        Judgment: Judgment normal.     Lab Results  Component Value Date   WBC 10.1 06/15/2022   HGB 10.8 (L) 06/15/2022    HCT 32.5 (L) 06/15/2022   PLT 218.0 06/15/2022   GLUCOSE 114 (H) 06/15/2022   CHOL  109 05/05/2023   TRIG 61 05/05/2023   HDL 44 05/05/2023   LDLDIRECT 127.9 07/14/2011   LDLCALC 51 05/05/2023   ALT 12 06/15/2022   AST 16 06/15/2022   NA 140 06/15/2022   K 4.3 06/15/2022   CL 104 06/15/2022   CREATININE 1.98 (H) 06/15/2022   BUN 30 (H) 06/15/2022   CO2 26 06/15/2022   TSH 3.27 05/06/2021   INR 0.98 06/07/2014   HGBA1C 5.1 11/11/2022   MICROALBUR 1.9 04/29/2020    CT ABDOMEN PELVIS WO CONTRAST  Result Date: 06/25/2022 CLINICAL DATA:  Left lower quadrant abdominal pain. EXAM: CT ABDOMEN AND PELVIS WITHOUT CONTRAST TECHNIQUE: Multidetector CT imaging of the abdomen and pelvis was performed following the standard protocol without IV contrast. RADIATION DOSE REDUCTION: This exam was performed according to the departmental dose-optimization program which includes automated exposure control, adjustment of the mA and/or kV according to patient size and/or use of iterative reconstruction technique. COMPARISON:  Ultrasound kidneys 04/14/2022. Ultrasound abdomen 04/01/2011. MRI pelvis 12/10/2005 FINDINGS: Lower chest: Lung bases are clear.  Small esophageal hiatal hernia. Hepatobiliary: Cholelithiasis with a single radiopaque stone demonstrated in the gallbladder. No inflammatory changes. No bile duct dilatation. No focal liver lesions. Pancreas: Unremarkable. No pancreatic ductal dilatation or surrounding inflammatory changes. Spleen: Normal in size without focal abnormality. Adrenals/Urinary Tract: Adrenal glands are unremarkable. Kidneys are normal, without renal calculi, focal lesion, or hydronephrosis. Bladder is unremarkable. Stomach/Bowel: Stomach, small bowel, and colon are not abnormally distended. Contrast material flows through to the rectum without evidence of obstruction. Diverticulosis of the sigmoid colon. Minimal inflammatory changes are demonstrated in the fat around the mid sigmoid  region suggesting possible early acute diverticulitis changes. No abscess or perforation. Appendix is not identified. Vascular/Lymphatic: Extensive aortic atherosclerosis. Probably at least moderate calcific stenosis of the infrarenal abdominal aorta and iliac artery origins. No significant lymphadenopathy. IVC appears normal in caliber. Reproductive: Surgical absence of the uterus. Ovaries are not enlarged. Other: No free air or free fluid in the abdomen. Tiny periumbilical hernia containing fat. Musculoskeletal: Degenerative changes.  No acute bony abnormalities. IMPRESSION: 1. Diverticulosis of the sigmoid colon with focal area of slight pericolonic infiltration, possibly early acute diverticulitis. No abscess. 2. Cholelithiasis without evidence of acute cholecystitis. 3. Extensive aortic atherosclerosis with at least moderate calcific stenosis demonstrated in the abdominal aorta and iliac artery origins. Electronically Signed   By: Burman Nieves M.D.   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     Follow-up: Return in about 6 months (around 11/16/2023) for Wellness Exam.  Sonda Primes, MD

## 2023-05-17 NOTE — Assessment & Plan Note (Signed)
Recent lipid panel was reviewed Cont on Lipitor

## 2023-05-25 ENCOUNTER — Telehealth: Payer: Self-pay

## 2023-05-25 NOTE — Telephone Encounter (Addendum)
Results seen by patient via MyChart.----- Message from Cannon Kettle, PA-C sent at 05/12/2023 10:02 AM EDT ----- Cholesterol continue to be well-controlled.  LDL 51 (goal less than 70).  HDL (good cholesterol) and triglycerides improved. -Continue Lipitor and Zetia

## 2023-06-01 ENCOUNTER — Ambulatory Visit: Payer: Medicare HMO | Admitting: Podiatry

## 2023-06-01 ENCOUNTER — Encounter: Payer: Self-pay | Admitting: Podiatry

## 2023-06-01 VITALS — BP 148/48

## 2023-06-01 DIAGNOSIS — M79675 Pain in left toe(s): Secondary | ICD-10-CM | POA: Diagnosis not present

## 2023-06-01 DIAGNOSIS — E1122 Type 2 diabetes mellitus with diabetic chronic kidney disease: Secondary | ICD-10-CM | POA: Diagnosis not present

## 2023-06-01 DIAGNOSIS — M79674 Pain in right toe(s): Secondary | ICD-10-CM | POA: Diagnosis not present

## 2023-06-01 DIAGNOSIS — B351 Tinea unguium: Secondary | ICD-10-CM | POA: Diagnosis not present

## 2023-06-01 DIAGNOSIS — N1831 Chronic kidney disease, stage 3a: Secondary | ICD-10-CM

## 2023-06-01 NOTE — Progress Notes (Signed)
  Subjective:  Patient ID: Bridget Oliver, female    DOB: 04/25/1941,  MRN: [id number]  Bridget Oliver presents to clinic today for: at risk foot care. Pt has h/o NIDDM with chronic kidney disease and painful thick toenails that are difficult to trim. Pain interferes with ambulation. Aggravating factors include wearing enclosed shoe gear. Pain is relieved with periodic professional debridement.  Chief Complaint  Patient presents with   Nail Problem    RFC    PCP is [name], Georgina Quint, MD.  Allergies  Allergen Reactions   Oxycodone     Nausea Can take codein    Review of Systems: Negative except as noted in the HPI.  Objective: No changes noted in today's physical examination. Vitals:   06/01/23 1008  BP: (!) 148/48    Bridget Oliver is a pleasant 82 y.o. female in NAD. AAO x 3.  Vascular Examination: Capillary refill time <3 seconds b/l LE. Palpable pedal pulses b/l LE. Digital hair sparse b/l. No pedal edema b/l. Skin temperature gradient WNL b/l. No varicosities b/l.  Dermatological Examination: Pedal skin with normal turgor, texture and tone b/l. No open wounds. No interdigital macerations b/l. Toenails 2-5 b/l thickened, discolored, dystrophic with subungual debris. There is pain on palpation to dorsal aspect of nailplates. Anonychia noted right great toe. Nailbed(s) epithelialized.    Neurological Examination: Protective sensation intact with 10 gram monofilament b/l LE. Vibratory sensation intact b/l LE.   Musculoskeletal Examination: Normal muscle strength 5/5 to all lower extremity muscle groups bilaterally. HAV with bunion deformity noted b/l LE. Hammertoe(s) noted to the L 2nd toe.Marland Kitchen No pain, crepitus or joint limitation noted with ROM b/l LE.  Patient ambulates independently without assistive aids.     Latest Ref Rng & Units 11/11/2022    9:20 AM  Hemoglobin A1C  Hemoglobin-A1c 4.0 - 5.6 % 5.1    Assessment/Plan: 1. Pain due to onychomycosis of toenails of both feet    2. Type 2 diabetes mellitus with stage 3a chronic kidney disease, without long-term current use of insulin (HCC)     -Consent given for treatment as described below: -Examined patient. -Continue foot and shoe inspections daily. Monitor blood glucose per PCP/Endocrinologist's recommendations. -Patient to continue soft, supportive shoe gear daily. -Toenails were debrided in length and girth 2-5 bilaterally and left great toe with sterile nail nippers and dremel without iatrogenic bleeding.  -Patient/POA to call should there be question/concern in the interim.   Return in about 3 months (around 09/01/2023).  Bridget Oliver, DPM

## 2023-06-03 DIAGNOSIS — R809 Proteinuria, unspecified: Secondary | ICD-10-CM | POA: Diagnosis not present

## 2023-06-11 ENCOUNTER — Other Ambulatory Visit: Payer: Self-pay | Admitting: Internal Medicine

## 2023-06-18 DIAGNOSIS — I129 Hypertensive chronic kidney disease with stage 1 through stage 4 chronic kidney disease, or unspecified chronic kidney disease: Secondary | ICD-10-CM | POA: Diagnosis not present

## 2023-06-18 DIAGNOSIS — E1122 Type 2 diabetes mellitus with diabetic chronic kidney disease: Secondary | ICD-10-CM | POA: Diagnosis not present

## 2023-06-18 DIAGNOSIS — N184 Chronic kidney disease, stage 4 (severe): Secondary | ICD-10-CM | POA: Diagnosis not present

## 2023-06-18 DIAGNOSIS — N2581 Secondary hyperparathyroidism of renal origin: Secondary | ICD-10-CM | POA: Diagnosis not present

## 2023-06-18 LAB — LAB REPORT - SCANNED: EGFR: 28

## 2023-06-19 ENCOUNTER — Other Ambulatory Visit: Payer: Self-pay | Admitting: Internal Medicine

## 2023-06-21 ENCOUNTER — Other Ambulatory Visit: Payer: Self-pay | Admitting: Nephrology

## 2023-06-21 DIAGNOSIS — N184 Chronic kidney disease, stage 4 (severe): Secondary | ICD-10-CM

## 2023-07-02 ENCOUNTER — Ambulatory Visit
Admission: RE | Admit: 2023-07-02 | Discharge: 2023-07-02 | Disposition: A | Discharge status: Home / Self Care | Payer: Medicare HMO | Source: Ambulatory Visit | Attending: Nephrology | Admitting: Nephrology

## 2023-07-02 DIAGNOSIS — N184 Chronic kidney disease, stage 4 (severe): Secondary | ICD-10-CM

## 2023-07-06 ENCOUNTER — Ambulatory Visit (INDEPENDENT_AMBULATORY_CARE_PROVIDER_SITE_OTHER): Payer: Medicare HMO

## 2023-07-06 VITALS — Ht 63.0 in | Wt 169.0 lb

## 2023-07-06 DIAGNOSIS — Z1211 Encounter for screening for malignant neoplasm of colon: Secondary | ICD-10-CM | POA: Diagnosis not present

## 2023-07-06 DIAGNOSIS — Z0000 Encounter for general adult medical examination without abnormal findings: Secondary | ICD-10-CM

## 2023-07-06 NOTE — Progress Notes (Addendum)
Subjective:   Bridget Oliver is a 82 y.o. female who presents for Medicare Annual (Subsequent) preventive examination.  Visit Complete: Virtual  I connected with  Bridget Oliver on 07/06/23 by a audio enabled telemedicine application and verified that I am speaking with the correct person using two identifiers.  Patient Location: Home  Provider Location: Office/Clinic  I discussed the limitations of evaluation and management by telemedicine. The patient expressed understanding and agreed to proceed.  Vital Signs: Unable to obtain new vitals due to this being a telehealth visit. Patient provided weight for this visit.   Review of Systems     Cardiac Risk Factors include: advanced age (>73men, >73 women);diabetes mellitus;dyslipidemia;hypertension;family history of premature cardiovascular disease     Objective:    Today's Vitals   07/06/23 1332  Weight: 169 lb (76.7 kg)  Height: 5\' 3"  (1.6 m)  PainSc: 0-No pain   Body mass index is 29.94 kg/m.     07/06/2023    1:35 PM 09/30/2021    6:00 PM 06/14/2020    2:25 PM 01/29/2016    9:33 AM  Advanced Directives  Does Patient Have a Medical Advance Directive? Yes Yes No No  Type of Estate agent of [place];Living will Healthcare Power of [place];Living will    Does patient want to make changes to medical advance directive?  No - Patient declined    Copy of Healthcare Power of Attorney in Chart? No - copy requested No - copy requested    Would patient like information on creating a medical advance directive?   No - Patient declined     Current Medications (verified) Outpatient Encounter Medications as of 07/06/2023  Medication Sig   FARXIGA 5 MG TABS tablet Take 5 mg by mouth daily.   allopurinol (ZYLOPRIM) 100 MG tablet Take 0.5 tablets (50 mg total) by mouth daily.   amLODipine (NORVASC) 10 MG tablet Take 1 tablet (10 mg total) by mouth daily.   AREXVY 120 MCG/0.5ML injection    aspirin EC 81 MG EC tablet  Take 1 tablet (81 mg total) by mouth daily. Swallow whole.   atorvastatin (LIPITOR) 40 MG tablet TAKE 1 TABLET BY MOUTH EVERY DAY   carvedilol (COREG) 25 MG tablet Take 1 tablet (25 mg total) by mouth 2 (two) times daily with a meal.   Cholecalciferol (VITAMIN D3) 2000 UNITS capsule Take 1 capsule (2,000 Units total) by mouth daily.   Coenzyme Q10 (COQ10) 100 MG CAPS Take 1 capsule by mouth daily.   COMIRNATY syringe    ezetimibe (ZETIA) 10 MG tablet TAKE 1 TABLET BY MOUTH EVERY DAY   Fish Oil-Cholecalciferol (FISH OIL + D3) 1200-1000 MG-UNIT CAPS Take 1 capsule by mouth daily.   FLUAD QUADRIVALENT 0.5 ML injection    furosemide (LASIX) 80 MG tablet TAKE 1 TABLET BY MOUTH EVERY DAY   hydrALAZINE (APRESOLINE) 100 MG tablet Take 100 mg by mouth 3 (three) times daily.   losartan (COZAAR) 100 MG tablet TAKE 1 TABLET BY MOUTH EVERYDAY AT BEDTIME   metroNIDAZOLE (FLAGYL) 500 MG tablet Take 1 tablet (500 mg total) by mouth 3 (three) times daily.   Multiple Vitamins-Minerals (CENTRUM SILVER PO) Take 1 tablet by mouth daily.    nitroGLYCERIN (NITROSTAT) 0.4 MG SL tablet Place 1 tablet (0.4 mg total) under the tongue every 5 (five) minutes as needed for chest pain. Reported on 05/28/2016   ondansetron (ZOFRAN) 4 MG tablet Take 1 tablet (4 mg total) by mouth every 8 (  eight) hours as needed for nausea or vomiting.   pantoprazole (PROTONIX) 40 MG tablet TAKE 1 TABLET BY MOUTH EVERY DAY   Polyethyl Glycol-Propyl Glycol (SYSTANE FREE OP) Place 1 drop into both eyes daily as needed (dry eyes).   potassium chloride SA (KLOR-CON M20) 20 MEQ tablet Take 1 tablet (20 mEq total) by mouth daily.   Probiotic Product (ALIGN) 4 MG CAPS Take 1 capsule (4 mg total) by mouth daily.   vitamin B-12 (CYANOCOBALAMIN) 1000 MCG tablet Take 500 mcg by mouth every other day.    No facility-administered encounter medications on file as of 07/06/2023.    Allergies (verified) Oxycodone   History: Past Medical History:   Diagnosis Date   CRI (chronic renal insufficiency)    Stage 3   Diabetes mellitus 2011   Type II diet controlled   Diarrhea    GERD (gastroesophageal reflux disease)    Gout    Hyperglycemia    Hyperlipidemia    Hypertension    Lactose intolerance    Macular degeneration    Obesity    Swelling of lower limb    Past Surgical History:  Procedure Laterality Date   ABDOMINAL HYSTERECTOMY  1985   APPENDECTOMY  1985   CATARACT EXTRACTION  2010 & 2011   bilateral   CESAREAN SECTION  1969   LUMBAR LAMINECTOMY  2007   PARS PLANA VITRECTOMY W/ REPAIR OF MACULAR HOLE  2010 & 2011   Bilateral   Family History  Problem Relation Age of Onset   Hypertension Mother    Hypertension Father    Cancer Other        Lung   Colon cancer Neg Hx    Esophageal cancer Neg Hx    Stomach cancer Neg Hx    Rectal cancer Neg Hx    Social History   Socioeconomic History   Marital status: Married    Spouse name: Not on file   Number of children: Not on file   Years of education: Not on file   Highest education level: Not on file  Occupational History   Not on file  Tobacco Use   Smoking status: Never   Smokeless tobacco: Never  Substance and Sexual Activity   Alcohol use: Yes    Alcohol/week: 0.0 standard drinks of alcohol    Comment: rarely   Drug use: No   Sexual activity: Not on file  Other Topics Concern   Not on file  Social History Narrative   Not on file   Social Determinants of Health   Financial Resource Strain: Low Risk  (07/06/2023)   Overall Financial Resource Strain (CARDIA)    Difficulty of Paying Living Expenses: Not hard at all  Food Insecurity: No Food Insecurity (07/06/2023)   Hunger Vital Sign    Worried About Running Out of Food in the Last Year: Never true    Ran Out of Food in the Last Year: Never true  Transportation Needs: No Transportation Needs (07/06/2023)   PRAPARE - Administrator, Civil Service (Medical): No    Lack of Transportation  (Non-Medical): No  Physical Activity: Sufficiently Active (07/06/2023)   Exercise Vital Sign    Days of Exercise per Week: 5 days    Minutes of Exercise per Session: 30 min  Stress: No Stress Concern Present (07/06/2023)   [employer] of Occupational Health - Occupational Stress Questionnaire    Feeling of Stress : Not at all  Social Connections: Socially Integrated (07/06/2023)  Social Advertising account executive [NHANES]    Frequency of Communication with Friends and Family: More than three times a week    Frequency of Social Gatherings with Friends and Family: More than three times a week    Attends Religious Services: More than 4 times per year    Active Member of [employer] or Organizations: Yes    Attends Engineer, structural: More than 4 times per year    Marital Status: Married    Tobacco Counseling Counseling given: Not Answered   Clinical Intake:  Pre-visit preparation completed: Yes  Pain : No/denies pain Pain Score: 0-No pain     BMI - recorded: 29.94 Nutritional Status: BMI > 30  Obese Nutritional Risks: None Diabetes: No  How often do you need to have someone help you when you read instructions, pamphlets, or other written materials from your doctor or pharmacy?: 1 - Never What is the last grade level you completed in school?: [employer]  Interpreter Needed?: No  Information entered by ::  N. , LPN.   Activities of Daily Living    07/06/2023    1:40 PM  In your present state of health, do you have any difficulty performing the following activities:  Hearing? 0  Vision? 0  Difficulty concentrating or making decisions? 0  Walking or climbing stairs? 0  Dressing or bathing? 0  Doing errands, shopping? 0  Preparing Food and eating ? N  Using the Toilet? N  In the past six months, have you accidently leaked urine? N  Do you have problems with loss of bowel control? N  Managing your Medications? N  Managing your  Finances? N  Housekeeping or managing your Housekeeping? N    Patient Care Team: [name], Georgina Quint, MD as PCP - General Hilty, Lisette Abu, MD as PCP - Cardiology (Cardiology) Elvis Coil, MD (Nephrology) Mateo Flow, MD (Ophthalmology) Kathryne Hitch, MD (Orthopedic Surgery) Rennis Golden Lisette Abu, MD as Consulting Physician (Cardiology)  Indicate any recent Medical Services you may have received from other than [facility] providers in the past year (date may be approximate).     Assessment:   This is a routine wellness examination for Bobbie.  Hearing/Vision screen Hearing Screening - Comments:: Patient denied any hearing difficulty.   No hearing aids.   Vision Screening - Comments:: Patient does wear corrective lenses/contacts.  Annual eye exam done by: [facility] in September 2024.  Usually go every 6 months for macular degeneration.   Dietary issues and exercise activities discussed:     Goals Addressed   None   Depression Screen    07/06/2023    1:39 PM 05/17/2023    9:14 AM 11/11/2022    9:02 AM 06/15/2022    9:57 AM 11/05/2021    9:23 AM 05/06/2021    8:34 AM 04/29/2020    9:00 AM  PHQ 2/9 Scores  PHQ - 2 Score 0 0 0 4 0 0 0  PHQ- 9 Score 2  0 13 2 0     Fall Risk    07/06/2023    1:36 PM 05/17/2023    9:14 AM 11/11/2022    9:01 AM 06/15/2022    9:57 AM 11/05/2021    9:23 AM  Fall Risk   Falls in the past year? 0 0 0 1 0  Number falls in past yr: 0 0 0 0 0  Injury with Fall? 0 0 0 0 0  Risk for fall due to : No Fall  Risks No Fall Risks No Fall Risks  No Fall Risks  Follow up Falls prevention discussed Falls evaluation completed Falls evaluation completed      MEDICARE RISK AT HOME:  Medicare Risk at Home - 07/06/23 1335     Any stairs in or around the home? No    If so, are there any without handrails? No    Home free of loose throw rugs in walkways, pet beds, electrical cords, etc? Yes    Adequate lighting in your home to reduce risk  of falls? Yes    Life alert? No    Use of a cane, walker or w/c? No    Grab bars in the bathroom? Yes    Shower chair or bench in shower? No    Elevated toilet seat or a handicapped toilet? Yes             TIMED UP AND GO:  Was the test performed?  No    Cognitive Function:        07/06/2023    1:41 PM 06/14/2020    2:26 PM  6CIT Screen  What Year? 0 points 0 points  What month? 0 points 0 points  What time? 0 points 0 points  Count back from 20 0 points 0 points  Months in reverse 0 points 0 points  Repeat phrase 0 points 0 points  Total Score 0 points 0 points    Immunizations Immunization History  Administered Date(s) Administered   Fluad Quad(high Dose 65+) 08/11/2019, 08/19/2020, 09/17/2022   Influenza Whole 10/07/2010, 07/25/2011, 08/26/2012   Influenza, High Dose Seasonal PF 09/14/2013, 09/11/2017, 09/13/2018, 08/23/2021, 09/10/2021   Influenza,inj,Quad PF,6+ Mos 07/25/2014, 08/16/2015   PFIZER Comirnaty(Gray Top)Covid-19 Tri-Sucrose Vaccine 03/26/2021   PFIZER(Purple Top)SARS-COV-2 Vaccination 12/12/2019, 01/02/2020, 08/27/2020   PNEUMOCOCCAL CONJUGATE-20 11/05/2021   Pfizer Covid-19 Vaccine Bivalent Booster 51yrs & up 08/28/2022   Pneumococcal Conjugate-13 11/26/2014   Pneumococcal Polysaccharide-23 11/10/2011   Rsv, Mab, Judi Cong, 0.5 Ml, Neonate To 24 Mos(Beyfortus) 08/03/2022   Tdap 11/10/2011, 06/10/2021   Zoster Recombinant(Shingrix) 09/08/2018, 01/10/2019   Zoster, Live 02/28/2008    TDAP status: Up to date  Flu Vaccine status: Up to date  Pneumococcal vaccine status: Up to date  Covid-19 vaccine status: Completed vaccines  Qualifies for Shingles Vaccine? Yes   Zostavax completed Yes   Shingrix Completed?: Yes  Screening Tests Health Maintenance  Topic Date Due   Diabetic kidney evaluation - Urine ACR  04/29/2021   COVID-19 Vaccine (6 - 2023-24 season) 10/23/2022   OPHTHALMOLOGY EXAM  01/22/2023   INFLUENZA VACCINE  06/24/2023    HEMOGLOBIN A1C  08/01/2023   FOOT EXAM  10/07/2023   Diabetic kidney evaluation - eGFR measurement  06/17/2024   Medicare Annual Wellness (AWV)  07/05/2024   DTaP/Tdap/Td (3 - Td or Tdap) 06/11/2031   Pneumonia Vaccine 31+ Years old  Completed   DEXA SCAN  Completed   Zoster Vaccines- Shingrix  Completed   HPV VACCINES  Aged Out   Hepatitis C Screening  Discontinued    Health Maintenance  Health Maintenance Due  Topic Date Due   Diabetic kidney evaluation - Urine ACR  04/29/2021   COVID-19 Vaccine (6 - 2023-24 season) 10/23/2022   OPHTHALMOLOGY EXAM  01/22/2023   INFLUENZA VACCINE  06/24/2023    Colorectal cancer screening: Referral to GI placed 07/06/2023. Pt aware the office will call re: appt.  Mammogram status: Completed 05/14/2023. Repeat every year  Bone Density status: Completed 03/16/2018. Results reflect: Bone density  results: NORMAL. Repeat every 5 years.  Lung Cancer Screening: (Low Dose CT Chest recommended if Age 61-80 years, 20 pack-year currently smoking OR have quit w/in 15years.) does not qualify.   Lung Cancer Screening Referral: no  Additional Screening:  Hepatitis C Screening: does qualify; Completed 04/12/2007  Vision Screening: Recommended annual ophthalmology exams for early detection of glaucoma and other disorders of the eye. Is the patient up to date with their annual eye exam?  Yes  Who is the provider or what is the name of the office in which the patient attends annual eye exams? Scheduled to see [facility] in September 2024. If pt is not established with a provider, would they like to be referred to a provider to establish care? No .   Dental Screening: Recommended annual dental exams for proper oral hygiene  Diabetic Foot Exam: Diabetic Foot Exam: Completed 10/06/2022  Community Resource Referral / Chronic Care Management: CRR required this visit?  No   CCM required this visit?  No     Plan:     I have personally  reviewed and noted the following in the patient's chart:   Medical and social history Use of alcohol, tobacco or illicit drugs  Current medications and supplements including opioid prescriptions. Patient is not currently taking opioid prescriptions. Functional ability and status Nutritional status Physical activity Advanced directives List of other physicians Hospitalizations, surgeries, and ER visits in previous 12 months Vitals Screenings to include cognitive, depression, and falls Referrals and appointments  In addition, I have reviewed and discussed with patient certain preventive protocols, quality metrics, and best practice recommendations. A written personalized care plan for preventive services as well as general preventive health recommendations were provided to patient.     Mickeal Needy, LPN   08/06/7828   After Visit Summary: (Mail) Due to this being a telephonic visit, the after visit summary with patients personalized plan was offered to patient via mail   Nurse Notes: Normal cognitive status assessed by direct observation via telephone conversation by this Nurse Health Advisor. No abnormalities found.    Medical screening examination/treatment/procedure(s) were performed by non-physician practitioner and as supervising physician I was immediately available for consultation/collaboration.  I agree with above. Jacinta Shoe, MD

## 2023-07-06 NOTE — Patient Instructions (Addendum)
Bridget Oliver , Thank you for taking time to come for your Medicare Wellness Visit. I appreciate your ongoing commitment to your health goals. Please review the following plan we discussed and let me know if I can assist you in the future.   Referrals/Orders/Follow-Ups/Clinician Recommendations: Yes; [facility] for colon cancer screening.  This is a list of the screening recommended for you and due dates:  Health Maintenance  Topic Date Due   Yearly kidney health urinalysis for diabetes  04/29/2021   COVID-19 Vaccine (6 - 2023-24 season) 10/23/2022   Eye exam for diabetics  01/22/2023   Flu Shot  06/24/2023   Hemoglobin A1C  08/01/2023   Complete foot exam   10/07/2023   Yearly kidney function blood test for diabetes  06/17/2024   Medicare Annual Wellness Visit  07/05/2024   DTaP/Tdap/Td vaccine (3 - Td or Tdap) 06/11/2031   Pneumonia Vaccine  Completed   DEXA scan (bone density measurement)  Completed   Zoster (Shingles) Vaccine  Completed   HPV Vaccine  Aged Out   Hepatitis C Screening  Discontinued    Advanced directives: (Copy Requested) Please bring a copy of your health care power of attorney and living will to the office to be added to your chart at your convenience.  Next Medicare Annual Wellness Visit scheduled for next year: Yes  Preventive Care 41 Years and Older, Female Preventive care refers to lifestyle choices and visits with your health care provider that can promote health and wellness. What does preventive care include? A yearly physical exam. This is also called an annual well check. Dental exams once or twice a year. Routine eye exams. Ask your health care provider how often you should have your eyes checked. Personal lifestyle choices, including: Daily care of your teeth and gums. Regular physical activity. Eating a healthy diet. Avoiding tobacco and drug use. Limiting alcohol use. Practicing safe sex. Taking low-dose aspirin every day. Taking  vitamin and mineral supplements as recommended by your health care provider. What happens during an annual well check? The services and screenings done by your health care provider during your annual well check will depend on your age, overall health, lifestyle risk factors, and family history of disease. Counseling  Your health care provider may ask you questions about your: Alcohol use. Tobacco use. Drug use. Emotional well-being. Home and relationship well-being. Sexual activity. Eating habits. History of falls. Memory and ability to understand (cognition). Work and work Astronomer. Reproductive health. Screening  You may have the following tests or measurements: Height, weight, and BMI. Blood pressure. Lipid and cholesterol levels. These may be checked every 5 years, or more frequently if you are over 26 years old. Skin check. Lung cancer screening. You may have this screening every year starting at age 27 if you have a 30-pack-year history of smoking and currently smoke or have quit within the past 15 years. Fecal occult blood test (FOBT) of the stool. You may have this test every year starting at age 25. Flexible sigmoidoscopy or colonoscopy. You may have a sigmoidoscopy every 5 years or a colonoscopy every 10 years starting at age 31. Hepatitis C blood test. Hepatitis B blood test. Sexually transmitted disease (STD) testing. Diabetes screening. This is done by checking your blood sugar (glucose) after you have not eaten for a while (fasting). You may have this done every 1-3 years. Bone density scan. This is done to screen for osteoporosis. You may have this done starting at age 22. Mammogram. This  may be done every 1-2 years. Talk to your health care provider about how often you should have regular mammograms. Talk with your health care provider about your test results, treatment options, and if necessary, the need for more tests. Vaccines  Your health care provider may  recommend certain vaccines, such as: Influenza vaccine. This is recommended every year. Tetanus, diphtheria, and acellular pertussis (Tdap, Td) vaccine. You may need a Td booster every 10 years. Zoster vaccine. You may need this after age 71. Pneumococcal 13-valent conjugate (PCV13) vaccine. One dose is recommended after age 58. Pneumococcal polysaccharide (PPSV23) vaccine. One dose is recommended after age 3. Talk to your health care provider about which screenings and vaccines you need and how often you need them. This information is not intended to replace advice given to you by your health care provider. Make sure you discuss any questions you have with your health care provider. Document Released: 12/06/2015 Document Revised: 07/29/2016 Document Reviewed: 09/10/2015 Elsevier Interactive Patient Education  2017 [employer].  Fall Prevention in the Home Falls can cause injuries. They can happen to people of all ages. There are many things you can do to make your home safe and to help prevent falls. What can I do on the outside of my home? Regularly fix the edges of walkways and driveways and fix any cracks. Remove anything that might make you trip as you walk through a door, such as a raised step or threshold. Trim any bushes or trees on the path to your home. Use bright outdoor lighting. Clear any walking paths of anything that might make someone trip, such as rocks or tools. Regularly check to see if handrails are loose or broken. Make sure that both sides of any steps have handrails. Any raised decks and porches should have guardrails on the edges. Have any leaves, snow, or ice cleared regularly. Use sand or salt on walking paths during winter. Clean up any spills in your garage right away. This includes oil or grease spills. What can I do in the bathroom? Use night lights. Install grab bars by the toilet and in the tub and shower. Do not use towel bars as grab bars. Use non-skid  mats or decals in the tub or shower. If you need to sit down in the shower, use a plastic, non-slip stool. Keep the floor dry. Clean up any water that spills on the floor as soon as it happens. Remove soap buildup in the tub or shower regularly. Attach bath mats securely with double-sided non-slip rug tape. Do not have throw rugs and other things on the floor that can make you trip. What can I do in the bedroom? Use night lights. Make sure that you have a light by your bed that is easy to reach. Do not use any sheets or blankets that are too big for your bed. They should not hang down onto the floor. Have a firm chair that has side arms. You can use this for support while you get dressed. Do not have throw rugs and other things on the floor that can make you trip. What can I do in the kitchen? Clean up any spills right away. Avoid walking on wet floors. Keep items that you use a lot in easy-to-reach places. If you need to reach something above you, use a strong step stool that has a grab bar. Keep electrical cords out of the way. Do not use floor polish or wax that makes floors slippery. If you must  use wax, use non-skid floor wax. Do not have throw rugs and other things on the floor that can make you trip. What can I do with my stairs? Do not leave any items on the stairs. Make sure that there are handrails on both sides of the stairs and use them. Fix handrails that are broken or loose. Make sure that handrails are as long as the stairways. Check any carpeting to make sure that it is firmly attached to the stairs. Fix any carpet that is loose or worn. Avoid having throw rugs at the top or bottom of the stairs. If you do have throw rugs, attach them to the floor with carpet tape. Make sure that you have a light switch at the top of the stairs and the bottom of the stairs. If you do not have them, ask someone to add them for you. What else can I do to help prevent falls? Wear shoes  that: Do not have high heels. Have rubber bottoms. Are comfortable and fit you well. Are closed at the toe. Do not wear sandals. If you use a stepladder: Make sure that it is fully opened. Do not climb a closed stepladder. Make sure that both sides of the stepladder are locked into place. Ask someone to hold it for you, if possible. Clearly mark and make sure that you can see: Any grab bars or handrails. First and last steps. Where the edge of each step is. Use tools that help you move around (mobility aids) if they are needed. These include: Canes. Walkers. Scooters. Crutches. Turn on the lights when you go into a dark area. Replace any light bulbs as soon as they burn out. Set up your furniture so you have a clear path. Avoid moving your furniture around. If any of your floors are uneven, fix them. If there are any pets around you, be aware of where they are. Review your medicines with your doctor. Some medicines can make you feel dizzy. This can increase your chance of falling. Ask your doctor what other things that you can do to help prevent falls. This information is not intended to replace advice given to you by your health care provider. Make sure you discuss any questions you have with your health care provider. Document Released: 09/05/2009 Document Revised: 04/16/2016 Document Reviewed: 12/14/2014 Elsevier Interactive Patient Education  2017 [employer].

## 2023-07-15 ENCOUNTER — Other Ambulatory Visit: Payer: Self-pay | Admitting: Internal Medicine

## 2023-07-28 DIAGNOSIS — I129 Hypertensive chronic kidney disease with stage 1 through stage 4 chronic kidney disease, or unspecified chronic kidney disease: Secondary | ICD-10-CM | POA: Diagnosis not present

## 2023-07-28 DIAGNOSIS — E785 Hyperlipidemia, unspecified: Secondary | ICD-10-CM | POA: Diagnosis not present

## 2023-07-28 DIAGNOSIS — N184 Chronic kidney disease, stage 4 (severe): Secondary | ICD-10-CM | POA: Diagnosis not present

## 2023-07-28 DIAGNOSIS — E1122 Type 2 diabetes mellitus with diabetic chronic kidney disease: Secondary | ICD-10-CM | POA: Diagnosis not present

## 2023-08-11 DIAGNOSIS — H401131 Primary open-angle glaucoma, bilateral, mild stage: Secondary | ICD-10-CM | POA: Diagnosis not present

## 2023-08-11 DIAGNOSIS — Z961 Presence of intraocular lens: Secondary | ICD-10-CM | POA: Diagnosis not present

## 2023-08-16 ENCOUNTER — Encounter: Payer: Self-pay | Admitting: Gastroenterology

## 2023-09-07 ENCOUNTER — Ambulatory Visit: Payer: Medicare HMO | Admitting: Podiatry

## 2023-09-08 ENCOUNTER — Other Ambulatory Visit: Payer: Self-pay | Admitting: Internal Medicine

## 2023-09-09 DIAGNOSIS — E1122 Type 2 diabetes mellitus with diabetic chronic kidney disease: Secondary | ICD-10-CM | POA: Diagnosis not present

## 2023-09-09 DIAGNOSIS — N184 Chronic kidney disease, stage 4 (severe): Secondary | ICD-10-CM | POA: Diagnosis not present

## 2023-09-09 DIAGNOSIS — E785 Hyperlipidemia, unspecified: Secondary | ICD-10-CM | POA: Diagnosis not present

## 2023-09-09 DIAGNOSIS — I129 Hypertensive chronic kidney disease with stage 1 through stage 4 chronic kidney disease, or unspecified chronic kidney disease: Secondary | ICD-10-CM | POA: Diagnosis not present

## 2023-09-16 ENCOUNTER — Encounter: Payer: Self-pay | Admitting: Podiatry

## 2023-09-16 ENCOUNTER — Ambulatory Visit: Payer: Medicare HMO | Admitting: Podiatry

## 2023-09-16 DIAGNOSIS — M79675 Pain in left toe(s): Secondary | ICD-10-CM

## 2023-09-16 DIAGNOSIS — N1831 Chronic kidney disease, stage 3a: Secondary | ICD-10-CM

## 2023-09-16 DIAGNOSIS — E1122 Type 2 diabetes mellitus with diabetic chronic kidney disease: Secondary | ICD-10-CM

## 2023-09-16 DIAGNOSIS — M79674 Pain in right toe(s): Secondary | ICD-10-CM

## 2023-09-16 DIAGNOSIS — B351 Tinea unguium: Secondary | ICD-10-CM

## 2023-09-23 NOTE — Progress Notes (Signed)
Subjective:  Patient ID: Bridget Oliver, female    DOB: 1941-11-22,  MRN: [id number]  82 y.o. female presents at risk foot care. Pt has h/o NIDDM with chronic kidney disease and painful elongated mycotic toenails 1-5 bilaterally which are tender when wearing enclosed shoe gear. Pain is relieved with periodic professional debridement.  New problem(s): None   PCP is [name], Georgina Quint, MD , and last visit was July 06, 2023.  Allergies  Allergen Reactions   Oxycodone     Nausea Can take codein    Review of Systems: Negative except as noted in the HPI.   Objective:  Bridget Oliver is a pleasant 82 y.o. female WD, WN in NAD.Marland Kitchen AAO x 3.  Vascular Examination: Vascular status intact b/l with palpable pedal pulses. CFT immediate b/l. Pedal hair present. No edema. No pain with calf compression b/l. Skin temperature gradient WNL b/l. No varicosities noted. No cyanosis or clubbing noted.  Neurological Examination: Sensation grossly intact b/l with 10 gram monofilament. Vibratory sensation intact b/l.  Dermatological Examination: Pedal skin with normal turgor, texture and tone b/l. No open wounds nor interdigital macerations noted. Toenails left hallux and 2-5 b/l thick, discolored, elongated with subungual debris and pain on dorsal palpation. No hyperkeratotic lesions noted b/l.   Anonychia noted R hallux. Nailbed(s) epithelialized.    Musculoskeletal Examination: Muscle strength 5/5 to b/l LE.  No pain, crepitus noted b/l. No gross pedal deformities. Patient ambulates independently without assistive aids.   Radiographs: None  Last A1c:      Latest Ref Rng & Units 11/11/2022    9:20 AM  Hemoglobin A1C  Hemoglobin-A1c 4.0 - 5.6 % 5.1      Assessment:   1. Pain due to onychomycosis of toenails of both feet   2. Type 2 diabetes mellitus with stage 3a chronic kidney disease, without long-term current use of insulin (HCC)    Plan:  Patient was evaluated and treated. All patient's  and/or POA's questions/concerns addressed on today's visit. Toenails 2-5 bilaterally and L hallux debrided in length and girth without incident. Continue soft, supportive shoe gear daily. Report any pedal injuries to medical professional. Call office if there are any questions/concerns. -Patient/POA to call should there be question/concern in the interim.  Return in about 3 months (around 12/17/2023).  Freddie Breech, DPM

## 2023-10-29 ENCOUNTER — Other Ambulatory Visit: Payer: Self-pay | Admitting: Internal Medicine

## 2023-11-11 ENCOUNTER — Ambulatory Visit: Payer: Medicare HMO | Admitting: Gastroenterology

## 2023-11-11 ENCOUNTER — Encounter: Payer: Self-pay | Admitting: Gastroenterology

## 2023-11-11 VITALS — BP 138/72 | HR 57 | Ht 63.0 in | Wt 178.0 lb

## 2023-11-11 DIAGNOSIS — Z860101 Personal history of adenomatous and serrated colon polyps: Secondary | ICD-10-CM | POA: Diagnosis not present

## 2023-11-11 DIAGNOSIS — Z860100 Personal history of colon polyps, unspecified: Secondary | ICD-10-CM

## 2023-11-11 DIAGNOSIS — Z01818 Encounter for other preprocedural examination: Secondary | ICD-10-CM

## 2023-11-11 MED ORDER — NA SULFATE-K SULFATE-MG SULF 17.5-3.13-1.6 GM/177ML PO SOLN: 1.0000 | Freq: Once | ORAL | 0 refills | Status: AC

## 2023-11-11 NOTE — Progress Notes (Addendum)
11/11/2023 AZARI DRUSCHEL [id number] 09/27/1941   HISTORY OF PRESENT ILLNESS: This is an 82 year old female who is previously a patient of Dr. Ardell Isaacs.  Her care will be assumed by Dr. Barron Alvine.  She has past medical history of chronic kidney disease, diabetes, hypertension, hyperlipidemia.  She is here today to discuss colonoscopy.  Her last was in January 2019 as listed below.  She does not have any GI complaints.  Colonoscopy 11/2017:  - Three 5 to 6 mm polyps in the transverse colon and in the ascending colon, removed with a cold snare. Resected and retrieved. - Moderate diverticulosis in the left colon. There was evidence of diverticular spasm. There was no evidence of diverticular bleeding. - The examination was otherwise normal on direct and retroflexion views.  Surgical [P], transverse - 2 and ascending, polyps (3) - SESSILE SERRATED POLYP WITHOUT CYTOLOGIC DYSPLASIA. - SEPARATE FRAGMENT OF BENIGN POLYPOID COLORECTAL MUCOSA WITH LYMPHOID AGGREGATE. - HIGH GRADE DYSPLASIA IS NOT IDENTIFIED.   Past Medical History:  Diagnosis Date   CRI (chronic renal insufficiency)    Stage 3   Diabetes mellitus 2011   Type II diet controlled   Diarrhea    GERD (gastroesophageal reflux disease)    Gout    Hyperglycemia    Hyperlipidemia    Hypertension    Lactose intolerance    Macular degeneration    Obesity    Swelling of lower limb    Past Surgical History:  Procedure Laterality Date   ABDOMINAL HYSTERECTOMY  1985   APPENDECTOMY  1985   CATARACT EXTRACTION  2010 & 2011   bilateral   CESAREAN SECTION  1969   LUMBAR LAMINECTOMY  2007   PARS PLANA VITRECTOMY W/ REPAIR OF MACULAR HOLE  2010 & 2011   Bilateral    reports that she has never smoked. She has never used smokeless tobacco. She reports current alcohol use. She reports that she does not use drugs. family history includes Hypertension in her father and mother; Lung cancer in her father. Allergies  Allergen Reactions    Oxycodone     Nausea Can take codein      Outpatient Encounter Medications as of 11/11/2023  Medication Sig   allopurinol (ZYLOPRIM) 100 MG tablet Take 0.5 tablets (50 mg total) by mouth daily.   amLODipine (NORVASC) 10 MG tablet Take 1 tablet (10 mg total) by mouth daily.   AREXVY 120 MCG/0.5ML injection    aspirin EC 81 MG EC tablet Take 1 tablet (81 mg total) by mouth daily. Swallow whole.   atorvastatin (LIPITOR) 40 MG tablet TAKE 1 TABLET BY MOUTH EVERY DAY   carvedilol (COREG) 25 MG tablet TAKE 1 TABLET (25 MG TOTAL) BY MOUTH TWICE A DAY WITH MEALS   Cholecalciferol (VITAMIN D3) 2000 UNITS capsule Take 1 capsule (2,000 Units total) by mouth daily.   Coenzyme Q10 (COQ10) 100 MG CAPS Take 1 capsule by mouth daily.   COMIRNATY syringe    ezetimibe (ZETIA) 10 MG tablet TAKE 1 TABLET BY MOUTH EVERY DAY   FARXIGA 5 MG TABS tablet Take 5 mg by mouth daily.   Fish Oil-Cholecalciferol (FISH OIL + D3) 1200-1000 MG-UNIT CAPS Take 1 capsule by mouth daily.   FLUAD QUADRIVALENT 0.5 ML injection    furosemide (LASIX) 80 MG tablet TAKE 1 TABLET BY MOUTH EVERY DAY   hydrALAZINE (APRESOLINE) 100 MG tablet Take 100 mg by mouth 3 (three) times daily.   losartan (COZAAR) 100 MG tablet TAKE 1 TABLET  BY MOUTH EVERYDAY AT BEDTIME   metroNIDAZOLE (FLAGYL) 500 MG tablet Take 1 tablet (500 mg total) by mouth 3 (three) times daily.   Multiple Vitamins-Minerals (CENTRUM SILVER PO) Take 1 tablet by mouth daily.    nitroGLYCERIN (NITROSTAT) 0.4 MG SL tablet Place 1 tablet (0.4 mg total) under the tongue every 5 (five) minutes as needed for chest pain. Reported on 05/28/2016   ondansetron (ZOFRAN) 4 MG tablet Take 1 tablet (4 mg total) by mouth every 8 (eight) hours as needed for nausea or vomiting.   pantoprazole (PROTONIX) 40 MG tablet TAKE 1 TABLET BY MOUTH EVERY DAY   Polyethyl Glycol-Propyl Glycol (SYSTANE FREE OP) Place 1 drop into both eyes daily as needed (dry eyes).   Probiotic Product (ALIGN) 4 MG  CAPS Take 1 capsule (4 mg total) by mouth daily.   vitamin B-12 (CYANOCOBALAMIN) 1000 MCG tablet Take 500 mcg by mouth every other day.    [DISCONTINUED] potassium chloride SA (KLOR-CON M20) 20 MEQ tablet Take 1 tablet (20 mEq total) by mouth daily.   No facility-administered encounter medications on file as of 11/11/2023.    REVIEW OF SYSTEMS  : All other systems reviewed and negative except where noted in the History of Present Illness.   PHYSICAL EXAM: BP 138/72   Pulse (!) 57   Ht 5\' 3"  (1.6 m)   Wt 178 lb (80.7 kg)   SpO2 97%   BMI 31.53 kg/m  General: Well developed female in no acute distress Head: Normocephalic and atraumatic Eyes:  Sclerae anicteric, conjunctiva pink. Ears: Normal auditory acuity Lungs: Clear throughout to auscultation; no W/R/R. Heart: Slightly bradycardic but regular rhythm; no M/R/G. Rectal:  Will be done at the time of colonoscopy. Musculoskeletal: Symmetrical with no gross deformities  Skin: No lesions on visible extremities Neurological: Alert oriented x 4, grossly non-focal Psychological:  Alert and cooperative. Normal mood and affect  ASSESSMENT AND PLAN: *Personal history of adenomatous colon polyps: Last colonoscopy January 2019.  No recall was recommended due to age.  She is here today wanting to discuss another colonoscopy.  Advised her that typically screening is discontinued between age 67 and 80 due to increased risk.  Overall she does not have a lot of medical problems and looks good for her age.  She would like to proceed with one more colonoscopy.  She knows that she is at somewhat increased risk.  Will schedule with Dr. Barron Alvine.  The risks, benefits, and alternatives to colonoscopy were discussed with the patient and she consents to proceed.     CC:  [name], Georgina Quint, MD

## 2023-11-11 NOTE — Patient Instructions (Signed)
You have been scheduled for a colonoscopy. Please follow written instructions given to you at your visit today.   Please pick up your prep supplies at the pharmacy within the next 1-3 days.  If you use inhalers (even only as needed), please bring them with you on the day of your procedure.  DO NOT TAKE 7 DAYS PRIOR TO TEST- Trulicity (dulaglutide) Ozempic, Wegovy (semaglutide) Mounjaro (tirzepatide) Bydureon Bcise (exanatide extended release)  DO NOT TAKE 1 DAY PRIOR TO YOUR TEST Rybelsus (semaglutide) Adlyxin (lixisenatide) Victoza (liraglutide) Byetta (exanatide) ________________________________________________________________________  _______________________________________________________  If your blood pressure at your visit was 140/90 or greater, please contact your primary care physician to follow up on this.  _______________________________________________________  If you are age 81 or older, your body mass index should be between 23-30. Your Body mass index is 31.53 kg/m. If this is out of the aforementioned range listed, please consider follow up with your Primary Care Provider.  If you are age 11 or younger, your body mass index should be between 19-25. Your Body mass index is 31.53 kg/m. If this is out of the aformentioned range listed, please consider follow up with your Primary Care Provider.   ________________________________________________________  The [facility] providers would like to encourage you to use [facility] to communicate with providers for non-urgent requests or questions.  Due to long hold times on the telephone, sending your provider a message by [facility] may be a faster and more efficient way to get a response.  Please allow 48 business hours for a response.  Please remember that this is for non-urgent requests.  _______________________________________________________

## 2023-11-16 ENCOUNTER — Encounter: Payer: Medicare HMO | Admitting: Internal Medicine

## 2023-11-20 ENCOUNTER — Other Ambulatory Visit: Payer: Self-pay | Admitting: Internal Medicine

## 2023-11-22 ENCOUNTER — Ambulatory Visit (INDEPENDENT_AMBULATORY_CARE_PROVIDER_SITE_OTHER): Payer: Medicare HMO | Admitting: Internal Medicine

## 2023-11-22 ENCOUNTER — Encounter: Payer: Self-pay | Admitting: Internal Medicine

## 2023-11-22 VITALS — BP 130/60 | HR 63 | Temp 98.2°F | Ht 63.0 in | Wt 178.0 lb

## 2023-11-22 DIAGNOSIS — I129 Hypertensive chronic kidney disease with stage 1 through stage 4 chronic kidney disease, or unspecified chronic kidney disease: Secondary | ICD-10-CM | POA: Diagnosis not present

## 2023-11-22 DIAGNOSIS — N1831 Type 2 diabetes mellitus with diabetic chronic kidney disease: Secondary | ICD-10-CM

## 2023-11-22 DIAGNOSIS — Z0000 Encounter for general adult medical examination without abnormal findings: Secondary | ICD-10-CM | POA: Diagnosis not present

## 2023-11-22 DIAGNOSIS — E1122 Type 2 diabetes mellitus with diabetic chronic kidney disease: Secondary | ICD-10-CM

## 2023-11-22 DIAGNOSIS — I6523 Occlusion and stenosis of bilateral carotid arteries: Secondary | ICD-10-CM | POA: Diagnosis not present

## 2023-11-22 DIAGNOSIS — E538 Deficiency of other specified B group vitamins: Secondary | ICD-10-CM | POA: Diagnosis not present

## 2023-11-22 MED ORDER — FUROSEMIDE 80 MG PO TABS: 80.0000 mg | ORAL_TABLET | Freq: Every day | ORAL | 3 refills | Status: AC

## 2023-11-22 NOTE — Assessment & Plan Note (Signed)
Cont on Vit B12 

## 2023-11-22 NOTE — Assessment & Plan Note (Addendum)
  Cont w/Hydralazine, Furosemide, Coreg, Losartan F/u w/Dr Peri Jefferson

## 2023-11-22 NOTE — Assessment & Plan Note (Addendum)
We discussed age appropriate health related issues, including available/recomended screening tests and vaccinations. Labs were ordered to be later reviewed . All questions were answered. We discussed one or more of the following - seat belt use, use of sunscreen/sun exposure exercise, fall risk reduction, second hand smoke exposure, firearm use and storage, seat belt use, a need for adhering to healthy diet and exercise.   All questions were answered. Per pt: all labs are done w/Dr Signe Colt

## 2023-11-22 NOTE — Progress Notes (Signed)
Subjective:  Patient ID: Bridget Oliver, female    DOB: June 19, 1941  Age: 82 y.o. MRN: [id number]  CC: Annual Exam   HPI Bridget Oliver presents for a well exam F/u CRI, HTN   Outpatient Medications Prior to Visit  Medication Sig Dispense Refill   allopurinol (ZYLOPRIM) 100 MG tablet Take 0.5 tablets (50 mg total) by mouth daily. 45 tablet 1   amLODipine (NORVASC) 10 MG tablet Take 1 tablet (10 mg total) by mouth daily. 90 tablet 3   AREXVY 120 MCG/0.5ML injection      aspirin EC 81 MG EC tablet Take 1 tablet (81 mg total) by mouth daily. Swallow whole. 30 tablet 11   atorvastatin (LIPITOR) 40 MG tablet TAKE 1 TABLET BY MOUTH EVERY DAY 90 tablet 1   carvedilol (COREG) 25 MG tablet TAKE 1 TABLET (25 MG TOTAL) BY MOUTH TWICE A DAY WITH MEALS 180 tablet 1   Cholecalciferol (VITAMIN D3) 2000 UNITS capsule Take 1 capsule (2,000 Units total) by mouth daily. 100 capsule 3   Coenzyme Q10 (COQ10) 100 MG CAPS Take 1 capsule by mouth daily.     COMIRNATY syringe      ezetimibe (ZETIA) 10 MG tablet TAKE 1 TABLET BY MOUTH EVERY DAY 90 tablet 1   FARXIGA 5 MG TABS tablet Take 5 mg by mouth daily.     Fish Oil-Cholecalciferol (FISH OIL + D3) 1200-1000 MG-UNIT CAPS Take 1 capsule by mouth daily.     hydrALAZINE (APRESOLINE) 100 MG tablet Take 100 mg by mouth 3 (three) times daily.     losartan (COZAAR) 100 MG tablet TAKE 1 TABLET BY MOUTH EVERYDAY AT BEDTIME 90 tablet 1   Multiple Vitamins-Minerals (CENTRUM SILVER PO) Take 1 tablet by mouth daily.      Na Sulfate-K Sulfate-Mg Sulf 17.5-3.13-1.6 GM/177ML SOLN See admin instructions.     nitroGLYCERIN (NITROSTAT) 0.4 MG SL tablet Place 1 tablet (0.4 mg total) under the tongue every 5 (five) minutes as needed for chest pain. Reported on 05/28/2016 25 tablet 2   ondansetron (ZOFRAN) 4 MG tablet Take 1 tablet (4 mg total) by mouth every 8 (eight) hours as needed for nausea or vomiting. 30 tablet 0   pantoprazole (PROTONIX) 40 MG tablet TAKE 1 TABLET BY MOUTH  EVERY DAY 90 tablet 3   Polyethyl Glycol-Propyl Glycol (SYSTANE FREE OP) Place 1 drop into both eyes daily as needed (dry eyes).     Probiotic Product (ALIGN) 4 MG CAPS Take 1 capsule (4 mg total) by mouth daily. 30 capsule 0   vitamin B-12 (CYANOCOBALAMIN) 1000 MCG tablet Take 500 mcg by mouth every other day.      furosemide (LASIX) 80 MG tablet TAKE 1 TABLET BY MOUTH EVERY DAY 90 tablet 0   FLUAD QUADRIVALENT 0.5 ML injection      metroNIDAZOLE (FLAGYL) 500 MG tablet Take 1 tablet (500 mg total) by mouth 3 (three) times daily. (Patient not taking: Reported on 11/22/2023) 30 tablet 0   No facility-administered medications prior to visit.    ROS: Review of Systems  Constitutional:  Negative for activity change, appetite change, chills, fatigue and unexpected weight change.  HENT:  Negative for congestion, mouth sores and sinus pressure.   Eyes:  Negative for visual disturbance.  Respiratory:  Negative for cough and chest tightness.   Gastrointestinal:  Negative for abdominal pain and nausea.  Genitourinary:  Negative for difficulty urinating, frequency and vaginal pain.  Musculoskeletal:  Negative for back pain and gait  problem.  Skin:  Negative for pallor and rash.  Neurological:  Negative for dizziness, tremors, weakness, numbness and headaches.  Psychiatric/Behavioral:  Negative for confusion and sleep disturbance.     Objective:  BP 130/60 (BP Location: Left Arm, Patient Position: Sitting, Cuff Size: Normal)   Pulse 63   Temp 98.2 F (36.8 C) (Oral)   Ht 5\' 3"  (1.6 m)   Wt 178 lb (80.7 kg)   SpO2 98%   BMI 31.53 kg/m   BP Readings from Last 3 Encounters:  11/22/23 130/60  11/11/23 138/72  06/01/23 (!) 148/48    Wt Readings from Last 3 Encounters:  11/22/23 178 lb (80.7 kg)  11/11/23 178 lb (80.7 kg)  07/06/23 169 lb (76.7 kg)    Physical Exam Constitutional:      General: She is not in acute distress.    Appearance: She is well-developed. She is obese.  HENT:      Head: Normocephalic.     Right Ear: External ear normal.     Left Ear: External ear normal.     Nose: Nose normal.  Eyes:     General:        Right eye: No discharge.        Left eye: No discharge.     Conjunctiva/sclera: Conjunctivae normal.     Pupils: Pupils are equal, round, and reactive to light.  Neck:     Thyroid: No thyromegaly.     Vascular: No JVD.     Trachea: No tracheal deviation.  Cardiovascular:     Rate and Rhythm: Normal rate and regular rhythm.     Heart sounds: Normal heart sounds.  Pulmonary:     Effort: No respiratory distress.     Breath sounds: No stridor. No wheezing.  Abdominal:     General: Bowel sounds are normal. There is no distension.     Palpations: Abdomen is soft. There is no mass.     Tenderness: There is no abdominal tenderness. There is no guarding or rebound.  Musculoskeletal:        General: No tenderness.     Cervical back: Normal range of motion and neck supple. No rigidity.  Lymphadenopathy:     Cervical: No cervical adenopathy.  Skin:    Findings: No erythema or rash.  Neurological:     Cranial Nerves: No cranial nerve deficit.     Motor: No abnormal muscle tone.     Coordination: Coordination normal.     Deep Tendon Reflexes: Reflexes normal.  Psychiatric:        Behavior: Behavior normal.        Thought Content: Thought content normal.        Judgment: Judgment normal.     Lab Results  Component Value Date   WBC 10.1 06/15/2022   HGB 10.8 (L) 06/15/2022   HCT 32.5 (L) 06/15/2022   PLT 218.0 06/15/2022   GLUCOSE 114 (H) 06/15/2022   CHOL 109 05/05/2023   TRIG 61 05/05/2023   HDL 44 05/05/2023   LDLDIRECT 127.9 07/14/2011   LDLCALC 51 05/05/2023   ALT 12 06/15/2022   AST 16 06/15/2022   NA 140 06/15/2022   K 4.3 06/15/2022   CL 104 06/15/2022   CREATININE 1.98 (H) 06/15/2022   BUN 30 (H) 06/15/2022   CO2 26 06/15/2022   TSH 3.27 05/06/2021   INR 0.98 06/07/2014   HGBA1C 5.1 11/11/2022   MICROALBUR 1.9  04/29/2020    US RENAL ARTERY DUPLEX COMPLETE  Result Date: 07/03/2023 CLINICAL DATA:  Stage 4 chronic kidney disease EXAM: RENAL/URINARY TRACT ULTRASOUND RENAL DUPLEX DOPPLER ULTRASOUND COMPARISON:  04/14/2022 FINDINGS: Right Kidney: Length: 10.0 cm. Echogenicity within normal limits. No mass or hydronephrosis visualized. Left Kidney: Length: 10.1 cm. Echogenicity within normal limits. No mass or hydronephrosis visualized. Bladder:  Evaluation limited by collapsed configuration. RENAL DUPLEX ULTRASOUND Right Renal Artery Velocities: Origin:  66 cm/sec Mid: Not visualized Hilum:  92 cm/sec Interlobar:  88 cm/sec Arcuate:  64 cm/sec Left Renal Artery Velocities: Origin:  87 cm/sec Mid:  78 cm/sec Hilum:  94 cm/sec Interlobar:  101 cm/sec Arcuate:  74 cm/sec Aortic Velocity:  87 cm/sec Right Renal-Aortic Ratios: Origin: 0.76 Mid:  Not seen Hilum: 1.1 Interlobar: 1.0 Arcuate: 0.74 Left Renal-Aortic Ratios: Origin: 1.0 Mid: 0.9 Hilum: 1.1 Interlobar: 1.2 Arcuate: 0.9 IMPRESSION: No Doppler evidence of significant renal artery stenosis. Electronically Signed   By: Acquanetta Belling M.D.   On: 07/03/2023 07:56    Assessment & Plan:   Problem List Items Addressed This Visit     B12 deficiency   Cont on Vit B12      Hypertensive renal disease    Cont w/Hydralazine, Furosemide, Coreg, Losartan F/u w/Dr Peri Jefferson      Carotid artery stenosis   Cont on Lipitor      Relevant Medications   furosemide (LASIX) 80 MG tablet   DM (diabetes mellitus), type 2 with renal complications (HCC)   On diet      Well adult exam - Primary   We discussed age appropriate health related issues, including available/recomended screening tests and vaccinations. Labs were ordered to be later reviewed . All questions were answered. We discussed one or more of the following - seat belt use, use of sunscreen/sun exposure exercise, fall risk reduction, second hand smoke exposure, firearm use and storage, seat belt use, a need  for adhering to healthy diet and exercise.   All questions were answered. Per pt: all labs are done w/Dr Signe Colt          Meds ordered this encounter  Medications   furosemide (LASIX) 80 MG tablet    Sig: Take 1 tablet (80 mg total) by mouth daily.    Dispense:  90 tablet    Refill:  3      Follow-up: No follow-ups on file.  Sonda Primes, MD

## 2023-11-22 NOTE — Assessment & Plan Note (Signed)
  On diet  

## 2023-11-22 NOTE — Assessment & Plan Note (Signed)
Cont on Lipitor 

## 2023-12-01 DIAGNOSIS — N184 Chronic kidney disease, stage 4 (severe): Secondary | ICD-10-CM | POA: Diagnosis not present

## 2023-12-01 DIAGNOSIS — E1122 Type 2 diabetes mellitus with diabetic chronic kidney disease: Secondary | ICD-10-CM | POA: Diagnosis not present

## 2023-12-01 DIAGNOSIS — E1129 Type 2 diabetes mellitus with other diabetic kidney complication: Secondary | ICD-10-CM | POA: Diagnosis not present

## 2023-12-01 DIAGNOSIS — N2581 Secondary hyperparathyroidism of renal origin: Secondary | ICD-10-CM | POA: Diagnosis not present

## 2023-12-01 DIAGNOSIS — E785 Hyperlipidemia, unspecified: Secondary | ICD-10-CM | POA: Diagnosis not present

## 2023-12-01 DIAGNOSIS — I129 Hypertensive chronic kidney disease with stage 1 through stage 4 chronic kidney disease, or unspecified chronic kidney disease: Secondary | ICD-10-CM | POA: Diagnosis not present

## 2023-12-01 DIAGNOSIS — N189 Chronic kidney disease, unspecified: Secondary | ICD-10-CM | POA: Diagnosis not present

## 2023-12-03 ENCOUNTER — Other Ambulatory Visit: Payer: Self-pay | Admitting: Internal Medicine

## 2023-12-08 LAB — LAB REPORT - SCANNED: EGFR: 19

## 2023-12-15 ENCOUNTER — Encounter: Payer: Self-pay | Admitting: Gastroenterology

## 2023-12-15 DIAGNOSIS — I951 Orthostatic hypotension: Secondary | ICD-10-CM | POA: Diagnosis not present

## 2023-12-15 DIAGNOSIS — Z8249 Family history of ischemic heart disease and other diseases of the circulatory system: Secondary | ICD-10-CM | POA: Diagnosis not present

## 2023-12-15 DIAGNOSIS — E669 Obesity, unspecified: Secondary | ICD-10-CM | POA: Diagnosis not present

## 2023-12-15 DIAGNOSIS — I25119 Atherosclerotic heart disease of native coronary artery with unspecified angina pectoris: Secondary | ICD-10-CM | POA: Diagnosis not present

## 2023-12-15 DIAGNOSIS — I509 Heart failure, unspecified: Secondary | ICD-10-CM | POA: Diagnosis not present

## 2023-12-15 DIAGNOSIS — N184 Chronic kidney disease, stage 4 (severe): Secondary | ICD-10-CM | POA: Diagnosis not present

## 2023-12-15 DIAGNOSIS — Z823 Family history of stroke: Secondary | ICD-10-CM | POA: Diagnosis not present

## 2023-12-15 DIAGNOSIS — Z809 Family history of malignant neoplasm, unspecified: Secondary | ICD-10-CM | POA: Diagnosis not present

## 2023-12-15 DIAGNOSIS — Z7984 Long term (current) use of oral hypoglycemic drugs: Secondary | ICD-10-CM | POA: Diagnosis not present

## 2023-12-15 DIAGNOSIS — Z008 Encounter for other general examination: Secondary | ICD-10-CM | POA: Diagnosis not present

## 2023-12-15 DIAGNOSIS — Z7982 Long term (current) use of aspirin: Secondary | ICD-10-CM | POA: Diagnosis not present

## 2023-12-15 DIAGNOSIS — E785 Hyperlipidemia, unspecified: Secondary | ICD-10-CM | POA: Diagnosis not present

## 2023-12-15 DIAGNOSIS — I130 Hypertensive heart and chronic kidney disease with heart failure and stage 1 through stage 4 chronic kidney disease, or unspecified chronic kidney disease: Secondary | ICD-10-CM | POA: Diagnosis not present

## 2023-12-17 ENCOUNTER — Other Ambulatory Visit: Payer: Self-pay | Admitting: Internal Medicine

## 2023-12-22 ENCOUNTER — Encounter: Payer: Self-pay | Admitting: Gastroenterology

## 2023-12-22 ENCOUNTER — Ambulatory Visit: Payer: Medicare HMO | Admitting: Gastroenterology

## 2023-12-22 VITALS — BP 133/47 | HR 66 | Temp 97.8°F | Resp 19 | Ht 63.0 in | Wt 178.0 lb

## 2023-12-22 DIAGNOSIS — D125 Benign neoplasm of sigmoid colon: Secondary | ICD-10-CM

## 2023-12-22 DIAGNOSIS — K635 Polyp of colon: Secondary | ICD-10-CM | POA: Diagnosis not present

## 2023-12-22 DIAGNOSIS — K5730 Diverticulosis of large intestine without perforation or abscess without bleeding: Secondary | ICD-10-CM | POA: Diagnosis not present

## 2023-12-22 DIAGNOSIS — Z1211 Encounter for screening for malignant neoplasm of colon: Secondary | ICD-10-CM

## 2023-12-22 DIAGNOSIS — Z860101 Personal history of adenomatous and serrated colon polyps: Secondary | ICD-10-CM | POA: Diagnosis not present

## 2023-12-22 DIAGNOSIS — N183 Chronic kidney disease, stage 3 unspecified: Secondary | ICD-10-CM | POA: Diagnosis not present

## 2023-12-22 DIAGNOSIS — D122 Benign neoplasm of ascending colon: Secondary | ICD-10-CM | POA: Diagnosis not present

## 2023-12-22 DIAGNOSIS — E119 Type 2 diabetes mellitus without complications: Secondary | ICD-10-CM | POA: Diagnosis not present

## 2023-12-22 DIAGNOSIS — K6389 Other specified diseases of intestine: Secondary | ICD-10-CM | POA: Diagnosis not present

## 2023-12-22 DIAGNOSIS — I10 Essential (primary) hypertension: Secondary | ICD-10-CM | POA: Diagnosis not present

## 2023-12-22 DIAGNOSIS — E669 Obesity, unspecified: Secondary | ICD-10-CM | POA: Diagnosis not present

## 2023-12-22 DIAGNOSIS — Z860100 Personal history of colon polyps, unspecified: Secondary | ICD-10-CM

## 2023-12-22 MED ORDER — SODIUM CHLORIDE 0.9 % IV SOLN: 500.0000 mL | Freq: Once | INTRAVENOUS | Status: DC

## 2023-12-22 NOTE — Progress Notes (Signed)
Pt's states no medical or surgical changes since previsit or office visit.

## 2023-12-22 NOTE — Patient Instructions (Signed)
Please read handouts provided. Continue present medications. Await pathology results. Return to GI office as needed. Resume previous diet.   YOU HAD AN ENDOSCOPIC PROCEDURE TODAY AT THE [facility]:   Refer to the procedure report that was given to you for any specific questions about what was found during the examination.  If the procedure report does not answer your questions, please call your gastroenterologist to clarify.  If you requested that your care partner not be given the details of your procedure findings, then the procedure report has been included in a sealed envelope for you to review at your convenience later.  YOU SHOULD EXPECT: Some feelings of bloating in the abdomen. Passage of more gas than usual.  Walking can help get rid of the air that was put into your GI tract during the procedure and reduce the bloating. If you had a lower endoscopy (such as a colonoscopy or flexible sigmoidoscopy) you may notice spotting of blood in your stool or on the toilet paper. If you underwent a bowel prep for your procedure, you may not have a normal bowel movement for a few days.  Please Note:  You might notice some irritation and congestion in your nose or some drainage.  This is from the oxygen used during your procedure.  There is no need for concern and it should clear up in a day or so.  SYMPTOMS TO REPORT IMMEDIATELY:  Following lower endoscopy (colonoscopy or flexible sigmoidoscopy):  Excessive amounts of blood in the stool  Significant tenderness or worsening of abdominal pains  Swelling of the abdomen that is new, acute  Fever of 100F or higher.  For urgent or emergent issues, a gastroenterologist can be reached at any hour by calling [phone]. Do not use MyChart messaging for urgent concerns.    DIET:  We do recommend a small meal at first, but then you may proceed to your regular diet.  Drink plenty of fluids but you should avoid alcoholic beverages for 24  hours.  ACTIVITY:  You should plan to take it easy for the rest of today and you should NOT DRIVE or use heavy machinery until tomorrow (because of the sedation medicines used during the test).    FOLLOW UP: Our staff will call the number listed on your records the next business day following your procedure.  We will call around 7:15- 8:00 am to check on you and address any questions or concerns that you may have regarding the information given to you following your procedure. If we do not reach you, we will leave a message.     If any biopsies were taken you will be contacted by phone or by letter within the next 1-3 weeks.  Please call us at [phone] if you have not heard about the biopsies in 3 weeks.    SIGNATURES/CONFIDENTIALITY: You and/or your care partner have signed paperwork which will be entered into your electronic medical record.  These signatures attest to the fact that that the information above on your After Visit Summary has been reviewed and is understood.  Full responsibility of the confidentiality of this discharge information lies with you and/or your care-partner.

## 2023-12-22 NOTE — Progress Notes (Unsigned)
GASTROENTEROLOGY PROCEDURE H&P NOTE   Primary Care Physician: Tresa Garter, MD    Reason for Procedure:  Colon polyp surveillance  Plan:    Colonoscopy  Patient is appropriate for endoscopic procedure(s) in the ambulatory ([facility]) setting.  The nature of the procedure, as well as the risks, benefits, and alternatives were carefully and thoroughly reviewed with the patient. Ample time for discussion and questions allowed. The patient understood, was satisfied, and agreed to proceed.     HPI: Bridget Oliver is a 83 y.o. female who presents for colonoscopy for ongoing colon polyp surveillance and colon cancer screening.  No active GI symptoms.  No known family history of colon cancer or related malignancy.  Patient is otherwise without complaints or active issues today.  Last colonoscopy was 11/2017 and notable for 3 subcentimeter 5-6 mm polyps resected from the transverse/ascending colon (path: Sessile serrated polyp), left-sided diverticulosis, and recommended repeat in 5 years.  Otherwise, no significant changes in clinical history since last office appointment on 11/11/2023.  She did have follow-up with her PCM in the interim on 11/22/2023, and again no significant changes noted in the chart.  Past Medical History:  Diagnosis Date   CRI (chronic renal insufficiency)    Stage 3   Diabetes mellitus 2011   Type II diet controlled   Diarrhea    GERD (gastroesophageal reflux disease)    Gout    Hyperglycemia    Hyperlipidemia    Hypertension    Lactose intolerance    Macular degeneration    Obesity    Swelling of lower limb     Past Surgical History:  Procedure Laterality Date   ABDOMINAL HYSTERECTOMY  1985   APPENDECTOMY  1985   CATARACT EXTRACTION  2010 & 2011   bilateral   CESAREAN SECTION  1969   LUMBAR LAMINECTOMY  2007   PARS PLANA VITRECTOMY W/ REPAIR OF MACULAR HOLE  2010 & 2011   Bilateral    Prior to Admission medications   Medication Sig Start Date  End Date Taking? Authorizing Provider  allopurinol (ZYLOPRIM) 100 MG tablet TAKE 1/2 TABLET BY MOUTH DAILY 12/03/23  Yes [name], Georgina Quint, MD  amLODipine (NORVASC) 10 MG tablet Take 1 tablet (10 mg total) by mouth daily. 11/05/21  Yes [name], Georgina Quint, MD  aspirin EC 81 MG EC tablet Take 1 tablet (81 mg total) by mouth daily. Swallow whole. 10/03/21  Yes Swayze, Ava, DO  atorvastatin (LIPITOR) 40 MG tablet TAKE 1 TABLET BY MOUTH EVERY DAY 09/09/23  Yes [name], Georgina Quint, MD  carvedilol (COREG) 25 MG tablet TAKE 1 TABLET (25 MG TOTAL) BY MOUTH TWICE A DAY WITH MEALS 12/03/23  Yes [name], Georgina Quint, MD  Cholecalciferol (VITAMIN D3) 2000 UNITS capsule Take 1 capsule (2,000 Units total) by mouth daily. 08/16/15  Yes [name], Georgina Quint, MD  Coenzyme Q10 (COQ10) 100 MG CAPS Take 1 capsule by mouth daily.   Yes [provider]  ezetimibe (ZETIA) 10 MG tablet TAKE 1 TABLET BY MOUTH EVERY DAY 12/17/23  Yes [name], Georgina Quint, MD  FARXIGA 5 MG TABS tablet Take 5 mg by mouth daily. 07/03/23  Yes [provider]  Fish Oil-Cholecalciferol (FISH OIL + D3) 1200-1000 MG-UNIT CAPS Take 1 capsule by mouth daily.   Yes [provider]  FLUAD 0.5 ML injection  08/31/23  Yes [provider]  furosemide (LASIX) 80 MG tablet Take 1 tablet (80 mg total) by mouth daily. 11/22/23  Yes [name], Macarthur Critchley  V, MD  hydrALAZINE (APRESOLINE) 100 MG tablet Take 100 mg by mouth 3 (three) times daily. 03/17/23  Yes [provider]  losartan (COZAAR) 100 MG tablet TAKE 1 TABLET BY MOUTH EVERYDAY AT BEDTIME 10/29/23  Yes [name], Georgina Quint, MD  Multiple Vitamins-Minerals (CENTRUM SILVER PO) Take 1 tablet by mouth daily.    Yes [provider]  pantoprazole (PROTONIX) 40 MG tablet TAKE 1 TABLET BY MOUTH EVERY DAY 12/03/23  Yes [name], Georgina Quint, MD  Probiotic Product (ALIGN) 4 MG CAPS Take 1 capsule (4 mg total) by mouth daily. 03/02/22  Yes [name], Georgina Quint, MD   vitamin B-12 (CYANOCOBALAMIN) 1000 MCG tablet Take 500 mcg by mouth every other day.    Yes [provider]  AREXVY 120 MCG/0.5ML injection  08/03/22   [provider]  COMIRNATY syringe  08/28/22   [provider]  nitroGLYCERIN (NITROSTAT) 0.4 MG SL tablet Place 1 tablet (0.4 mg total) under the tongue every 5 (five) minutes as needed for chest pain. Reported on 05/28/2016 06/10/22   Cannon Kettle, PA-C  ondansetron (ZOFRAN) 4 MG tablet Take 1 tablet (4 mg total) by mouth every 8 (eight) hours as needed for nausea or vomiting. 06/15/22   Corwin Levins, MD  Polyethyl Glycol-Propyl Glycol (SYSTANE FREE OP) Place 1 drop into both eyes daily as needed (dry eyes).    [provider]    Current Outpatient Medications  Medication Sig Dispense Refill   allopurinol (ZYLOPRIM) 100 MG tablet TAKE 1/2 TABLET BY MOUTH DAILY 45 tablet 1   amLODipine (NORVASC) 10 MG tablet Take 1 tablet (10 mg total) by mouth daily. 90 tablet 3   aspirin EC 81 MG EC tablet Take 1 tablet (81 mg total) by mouth daily. Swallow whole. 30 tablet 11   atorvastatin (LIPITOR) 40 MG tablet TAKE 1 TABLET BY MOUTH EVERY DAY 90 tablet 1   carvedilol (COREG) 25 MG tablet TAKE 1 TABLET (25 MG TOTAL) BY MOUTH TWICE A DAY WITH MEALS 180 tablet 1   Cholecalciferol (VITAMIN D3) 2000 UNITS capsule Take 1 capsule (2,000 Units total) by mouth daily. 100 capsule 3   Coenzyme Q10 (COQ10) 100 MG CAPS Take 1 capsule by mouth daily.     ezetimibe (ZETIA) 10 MG tablet TAKE 1 TABLET BY MOUTH EVERY DAY 90 tablet 1   FARXIGA 5 MG TABS tablet Take 5 mg by mouth daily.     Fish Oil-Cholecalciferol (FISH OIL + D3) 1200-1000 MG-UNIT CAPS Take 1 capsule by mouth daily.     FLUAD 0.5 ML injection      furosemide (LASIX) 80 MG tablet Take 1 tablet (80 mg total) by mouth daily. 90 tablet 3   hydrALAZINE (APRESOLINE) 100 MG tablet Take 100 mg by mouth 3 (three) times daily.     losartan (COZAAR) 100 MG tablet TAKE 1 TABLET  BY MOUTH EVERYDAY AT BEDTIME 90 tablet 1   Multiple Vitamins-Minerals (CENTRUM SILVER PO) Take 1 tablet by mouth daily.      pantoprazole (PROTONIX) 40 MG tablet TAKE 1 TABLET BY MOUTH EVERY DAY 90 tablet 3   Probiotic Product (ALIGN) 4 MG CAPS Take 1 capsule (4 mg total) by mouth daily. 30 capsule 0   vitamin B-12 (CYANOCOBALAMIN) 1000 MCG tablet Take 500 mcg by mouth every other day.      AREXVY 120 MCG/0.5ML injection  (Patient not taking: Reported on 12/22/2023)     COMIRNATY syringe      nitroGLYCERIN (NITROSTAT) 0.4  MG SL tablet Place 1 tablet (0.4 mg total) under the tongue every 5 (five) minutes as needed for chest pain. Reported on 05/28/2016 25 tablet 2   ondansetron (ZOFRAN) 4 MG tablet Take 1 tablet (4 mg total) by mouth every 8 (eight) hours as needed for nausea or vomiting. 30 tablet 0   Polyethyl Glycol-Propyl Glycol (SYSTANE FREE OP) Place 1 drop into both eyes daily as needed (dry eyes).     Current Facility-Administered Medications  Medication Dose Route Frequency Provider Last Rate Last Admin   0.9 %  sodium chloride infusion  500 mL Intravenous Once Beni Turrell V, DO        Allergies as of 12/22/2023 - Review Complete 12/22/2023  Allergen Reaction Noted   Oxycodone Other (See Comments) 05/15/2016    Family History  Problem Relation Age of Onset   Hypertension Mother    Hypertension Father    Lung cancer Father        smoker   Colon cancer Neg Hx    Esophageal cancer Neg Hx    Stomach cancer Neg Hx    Rectal cancer Neg Hx     Social History   Socioeconomic History   Marital status: Married    Spouse name: Not on file   Number of children: 1   Years of education: Not on file   Highest education level: Not on file  Occupational History   Not on file  Tobacco Use   Smoking status: Never   Smokeless tobacco: Never  Vaping Use   Vaping status: Never Used  Substance and Sexual Activity   Alcohol use: Yes    Alcohol/week: 0.0 standard drinks of alcohol     Comment: rarely   Drug use: No   Sexual activity: Not on file  Other Topics Concern   Not on file  Social History Narrative   Not on file   Social Drivers of Health   Financial Resource Strain: Low Risk  (07/06/2023)   Overall Financial Resource Strain (CARDIA)    Difficulty of Paying Living Expenses: Not hard at all  Food Insecurity: No Food Insecurity (07/06/2023)   Hunger Vital Sign    Worried About Running Out of Food in the Last Year: Never true    Ran Out of Food in the Last Year: Never true  Transportation Needs: No Transportation Needs (07/06/2023)   PRAPARE - Administrator, Civil Service (Medical): No    Lack of Transportation (Non-Medical): No  Physical Activity: Sufficiently Active (07/06/2023)   Exercise Vital Sign    Days of Exercise per Week: 5 days    Minutes of Exercise per Session: 30 min  Stress: No Stress Concern Present (07/06/2023)   [employer] of Occupational Health - Occupational Stress Questionnaire    Feeling of Stress : Not at all  Social Connections: Socially Integrated (07/06/2023)   Social Connection and Isolation Panel [NHANES]    Frequency of Communication with Friends and Family: More than three times a week    Frequency of Social Gatherings with Friends and Family: More than three times a week    Attends Religious Services: More than 4 times per year    Active Member of [employer] or Organizations: Yes    Attends Banker Meetings: More than 4 times per year    Marital Status: Married  Catering manager Violence: Not At Risk (07/06/2023)   Humiliation, Afraid, Rape, and Kick questionnaire    Fear of Current or Ex-Partner: No  Emotionally Abused: No    Physically Abused: No    Sexually Abused: No    Physical Exam: Vital signs in last 24 hours: @BP  (!) 144/57   Pulse 78   Temp 97.8 F (36.6 C)   Ht 5\' 3"  (1.6 m)   Wt 178 lb (80.7 kg)   SpO2 97%   BMI 31.53 kg/m  GEN: NAD EYE: Sclerae anicteric ENT:  MMM CV: Non-tachycardic Pulm: CTA b/l GI: Soft, NT/ND NEURO:  Alert & Oriented x 3   Doristine Locks, DO [facility]   12/22/2023 3:19 PM

## 2023-12-22 NOTE — Progress Notes (Signed)
Called to room to assist during endoscopic procedure.  Patient ID and intended procedure confirmed with present staff. Received instructions for my participation in the procedure from the performing physician.

## 2023-12-22 NOTE — Progress Notes (Unsigned)
Sedate, gd SR, tolerated procedure well, VSS, report to RN

## 2023-12-22 NOTE — Op Note (Signed)
[facility] Patient Name: Bridget Oliver Procedure Date: 12/22/2023 2:54 PM MRN: [id number] Endoscopist: Doristine Locks , MD, [id number] Age: 83 Referring MD:  Date of Birth: 01/21/1941 Gender: Female Account #: [id number] Procedure:                Colonoscopy Indications:              High risk colon cancer surveillance: Personal                            history of sessile serrated colon polyp (less than                            10 mm in size) with no dysplasia                           Last colonoscopy was 11/2017 and notable for 3                            subcentimeter 5-6 mm polyps resected from the                            transverse/ascending colon (path: Sessile serrated                            polyp), left-sided diverticulosis, and recommended                            repeat in 5 years. Medicines:                Monitored Anesthesia Care Procedure:                Pre-Anesthesia Assessment:                           - Prior to the procedure, a History and Physical                            was performed, and patient medications and                            allergies were reviewed. The patient's tolerance of                            previous anesthesia was also reviewed. The risks                            and benefits of the procedure and the sedation                            options and risks were discussed with the patient.                            All questions were answered, and informed consent  was obtained. Prior Anticoagulants: The patient has                            taken no anticoagulant or antiplatelet agents. ASA                            Grade Assessment: II - A patient with mild systemic                            disease. After reviewing the risks and benefits,                            the patient was deemed in satisfactory condition to                            undergo the procedure.                            After obtaining informed consent, the colonoscope                            was passed under direct vision. Throughout the                            procedure, the patient's blood pressure, pulse, and                            oxygen saturations were monitored continuously. The                            CF [id number] #[id number] was introduced through the anus                            and advanced to the the cecum, identified by                            appendiceal orifice and ileocecal valve. The                            colonoscopy was performed without difficulty. The                            patient tolerated the procedure well. The quality                            of the bowel preparation was good. The ileocecal                            valve, appendiceal orifice, and rectum were                            photographed. Scope In: 3:31:05 PM Scope Out: 3:50:27 PM Scope Withdrawal Time: 0 hours 16 minutes 15 seconds  Total Procedure Duration: 0 hours 19  minutes 22 seconds  Findings:                 The perianal and digital rectal examinations were                            normal.                           A 6 mm polyp was found in the ascending colon. The                            polyp was sessile with apparent scarring. The polyp                            was removed with a cold snare. Given the scarred                            appearance, the edges were further resected with                            cold forceps via avulsion technique. Resection and                            retrieval were complete. Estimated blood loss was                            minimal.                           A 3 mm polyp was found in the sigmoid colon. The                            polyp was sessile. The polyp was removed with a                            cold snare. Resection and retrieval were complete.                            Estimated blood loss was minimal.                            Multiple large-mouthed and small-mouthed                            diverticula were found in the sigmoid colon and                            descending colon.                           The retroflexed view of the distal rectum and anal                            verge was normal and showed no anal or rectal  abnormalities. Complications:            No immediate complications. Estimated Blood Loss:     Estimated blood loss was minimal. Impression:               - One 6 mm polyp in the ascending colon, removed                            with a cold snare, and edges further resected with                            cold forceps. Resected and retrieved.                           - One 3 mm polyp in the sigmoid colon, removed with                            a cold snare. Resected and retrieved.                           - Diverticulosis in the sigmoid colon and in the                            descending colon.                           - The distal rectum and anal verge are normal on                            retroflexion view. Recommendation:           - Patient has a contact number available for                            emergencies. The signs and symptoms of potential                            delayed complications were discussed with the                            patient. Return to normal activities tomorrow.                            Written discharge instructions were provided to the                            patient.                           - Resume previous diet.                           - Continue present medications.                           - Await pathology results.                           -  Repeat colonoscopy for surveillance based on                            pathology results.                           - Return to GI office PRN. Doristine Locks, MD 12/22/2023 4:05:55 PM

## 2023-12-23 ENCOUNTER — Telehealth: Payer: Self-pay

## 2023-12-23 NOTE — Telephone Encounter (Signed)
  Follow up Call-     12/22/2023    3:11 PM  Call back number  Post procedure Call Back phone  # [phone]  Permission to leave phone message No     Patient questions:  Do you have a fever, pain , or abdominal swelling? No. Pain Score  0 *  Have you tolerated food without any problems? Yes.    Have you been able to return to your normal activities? Yes.    Do you have any questions about your discharge instructions: Diet   No. Medications  No. Follow up visit  No.  Do you have questions or concerns about your Care? No.  Actions: * If pain score is 4 or above: No action needed, pain <4.

## 2023-12-27 LAB — SURGICAL PATHOLOGY

## 2023-12-28 ENCOUNTER — Encounter: Payer: Self-pay | Admitting: Podiatry

## 2023-12-28 ENCOUNTER — Ambulatory Visit (INDEPENDENT_AMBULATORY_CARE_PROVIDER_SITE_OTHER): Payer: Medicare HMO | Admitting: Podiatry

## 2023-12-28 VITALS — Ht 63.0 in | Wt 178.0 lb

## 2023-12-28 DIAGNOSIS — E119 Type 2 diabetes mellitus without complications: Secondary | ICD-10-CM

## 2023-12-28 DIAGNOSIS — B351 Tinea unguium: Secondary | ICD-10-CM | POA: Diagnosis not present

## 2023-12-28 DIAGNOSIS — M2042 Other hammer toe(s) (acquired), left foot: Secondary | ICD-10-CM | POA: Diagnosis not present

## 2023-12-28 DIAGNOSIS — M2012 Hallux valgus (acquired), left foot: Secondary | ICD-10-CM

## 2023-12-28 DIAGNOSIS — M2011 Hallux valgus (acquired), right foot: Secondary | ICD-10-CM | POA: Diagnosis not present

## 2023-12-28 DIAGNOSIS — M79674 Pain in right toe(s): Secondary | ICD-10-CM | POA: Diagnosis not present

## 2023-12-28 DIAGNOSIS — M2041 Other hammer toe(s) (acquired), right foot: Secondary | ICD-10-CM

## 2023-12-28 DIAGNOSIS — E1122 Type 2 diabetes mellitus with diabetic chronic kidney disease: Secondary | ICD-10-CM | POA: Diagnosis not present

## 2023-12-28 DIAGNOSIS — N1831 Chronic kidney disease, stage 3a: Secondary | ICD-10-CM

## 2023-12-28 DIAGNOSIS — M79675 Pain in left toe(s): Secondary | ICD-10-CM

## 2023-12-30 ENCOUNTER — Encounter: Payer: Self-pay | Admitting: Gastroenterology

## 2024-01-03 NOTE — Progress Notes (Signed)
 ANNUAL DIABETIC FOOT EXAM  Subjective: Bridget Oliver presents today for annual diabetic foot exam.  Chief Complaint  Patient presents with   RFC    She is here for a nail trim, and PCP is Dr. Garald and seen 6 months ago.    Patient confirms h/o diabetes.  Patient denies any h/o foot wounds.  Bridget Oliver, Bridget GAILS, MD is patient's PCP.  Past Medical History:  Diagnosis Date   CRI (chronic renal insufficiency)    Stage 3   Diabetes mellitus 2011   Type II diet controlled   Diarrhea    GERD (gastroesophageal reflux disease)    Gout    Hyperglycemia    Hyperlipidemia    Hypertension    Lactose intolerance    Macular degeneration    Obesity    Swelling of lower limb    Patient Active Problem List   Diagnosis Date Noted   Claudication (HCC) 01/19/2022   Chest pain 09/30/2021   Hypertensive urgency 09/30/2021   CKD (chronic kidney disease) stage 4, GFR 15-29 ml/min (HCC) 09/30/2021   Gouty arthritis 09/12/2021   Hypertension 06/10/2021   Mixed hyperlipidemia 08/08/2018   Cough 06/27/2018   GERD (gastroesophageal reflux disease) 06/24/2017   Low back pain radiating to right lower extremity 05/15/2016   Well adult exam 12/19/2015   History of colonic polyps 11/26/2014   Right tennis elbow 11/21/2013   Right forearm pain 11/13/2013   Wrist pain 06/06/2013   DM (diabetes mellitus), type 2 with renal complications (HCC) 02/24/2012   Hand pain, right 05/07/2011   Bursitis of elbow 05/07/2011   Atypical chest pain 03/30/2011   Abdominal pain 03/30/2011   Hypertensive cardiomegaly 01/12/2011   Carotid artery stenosis 03/17/2010   Gout 03/10/2010   Disorder of kidney and ureter 07/20/2008   Dyslipidemia 03/21/2008   OBESITY, MORBID 03/21/2008   B12 deficiency 09/21/2007   Hypertensive renal disease 09/21/2007   Edema 09/21/2007   Past Surgical History:  Procedure Laterality Date   ABDOMINAL HYSTERECTOMY  1985   APPENDECTOMY  1985   CATARACT EXTRACTION  2010 &  2011   bilateral   CESAREAN SECTION  1969   LUMBAR LAMINECTOMY  2007   PARS PLANA VITRECTOMY W/ REPAIR OF MACULAR HOLE  2010 & 2011   Bilateral   Current Outpatient Medications on File Prior to Visit  Medication Sig Dispense Refill   allopurinol  (ZYLOPRIM ) 100 MG tablet TAKE 1/2 TABLET BY MOUTH DAILY 45 tablet 1   amLODipine  (NORVASC ) 10 MG tablet Take 1 tablet (10 mg total) by mouth daily. 90 tablet 3   aspirin  EC 81 MG EC tablet Take 1 tablet (81 mg total) by mouth daily. Swallow whole. 30 tablet 11   atorvastatin  (LIPITOR) 40 MG tablet TAKE 1 TABLET BY MOUTH EVERY DAY 90 tablet 1   carvedilol  (COREG ) 25 MG tablet TAKE 1 TABLET (25 MG TOTAL) BY MOUTH TWICE A DAY WITH MEALS 180 tablet 1   Cholecalciferol (VITAMIN D3) 2000 UNITS capsule Take 1 capsule (2,000 Units total) by mouth daily. 100 capsule 3   Coenzyme Q10 (COQ10) 100 MG CAPS Take 1 capsule by mouth daily.     COMIRNATY syringe      ezetimibe  (ZETIA ) 10 MG tablet TAKE 1 TABLET BY MOUTH EVERY DAY 90 tablet 1   FARXIGA 5 MG TABS tablet Take 5 mg by mouth daily.     Fish Oil-Cholecalciferol (FISH OIL + D3) 1200-1000 MG-UNIT CAPS Take 1 capsule by mouth daily.  furosemide  (LASIX ) 80 MG tablet Take 1 tablet (80 mg total) by mouth daily. 90 tablet 3   hydrALAZINE  (APRESOLINE ) 100 MG tablet Take 100 mg by mouth 3 (three) times daily.     losartan  (COZAAR ) 100 MG tablet TAKE 1 TABLET BY MOUTH EVERYDAY AT BEDTIME 90 tablet 1   Multiple Vitamins-Minerals (CENTRUM SILVER PO) Take 1 tablet by mouth daily.      nitroGLYCERIN  (NITROSTAT ) 0.4 MG SL tablet Place 1 tablet (0.4 mg total) under the tongue every 5 (five) minutes as needed for chest pain. Reported on 05/28/2016 25 tablet 2   ondansetron  (ZOFRAN ) 4 MG tablet Take 1 tablet (4 mg total) by mouth every 8 (eight) hours as needed for nausea or vomiting. 30 tablet 0   pantoprazole  (PROTONIX ) 40 MG tablet TAKE 1 TABLET BY MOUTH EVERY DAY 90 tablet 3   Polyethyl Glycol-Propyl Glycol (SYSTANE  FREE OP) Place 1 drop into both eyes daily as needed (dry eyes).     Probiotic Product (ALIGN) 4 MG CAPS Take 1 capsule (4 mg total) by mouth daily. 30 capsule 0   vitamin B-12 (CYANOCOBALAMIN ) 1000 MCG tablet Take 500 mcg by mouth every other day.      No current facility-administered medications on file prior to visit.    Allergies  Allergen Reactions   Oxycodone Other (See Comments)    Nausea Can take codein   Social History   Occupational History   Not on file  Tobacco Use   Smoking status: Never   Smokeless tobacco: Never  Vaping Use   Vaping status: Never Used  Substance and Sexual Activity   Alcohol use: Yes    Alcohol/week: 0.0 standard drinks of alcohol    Comment: rarely   Drug use: No   Sexual activity: Not on file   Family History  Problem Relation Age of Onset   Hypertension Mother    Hypertension Father    Lung cancer Father        smoker   Colon cancer Neg Hx    Esophageal cancer Neg Hx    Stomach cancer Neg Hx    Rectal cancer Neg Hx    Immunization History  Administered Date(s) Administered   Fluad Quad(high Dose 65+) 08/11/2019, 08/19/2020, 09/17/2022   Fluad Trivalent(High Dose 65+) 08/31/2023   Influenza Whole 10/07/2010, 07/25/2011, 08/26/2012   Influenza, High Dose Seasonal PF 09/14/2013, 09/11/2017, 09/13/2018, 08/23/2021, 09/10/2021   Influenza,inj,Quad PF,6+ Mos 07/25/2014, 08/16/2015   PFIZER Comirnaty(Gray Top)Covid-19 Tri-Sucrose Vaccine 03/26/2021   PFIZER(Purple Top)SARS-COV-2 Vaccination 12/12/2019, 01/02/2020, 08/27/2020   PNEUMOCOCCAL CONJUGATE-20 11/05/2021   Pfizer Covid-19 Vaccine Bivalent Booster 35yrs & up 08/28/2022   Pfizer(Comirnaty)Fall Seasonal Vaccine 12 years and older 09/06/2023   Pneumococcal Conjugate-13 11/26/2014   Pneumococcal Polysaccharide-23 11/10/2011   Rsv, Mab, [place], 0.5 Ml, Neonate To 24 Mos(Beyfortus) 08/03/2022   Tdap 11/10/2011, 06/10/2021   Zoster Recombinant(Shingrix) 09/08/2018,  01/10/2019   Zoster, Live 02/28/2008     Review of Systems: Negative except as noted in the HPI.   Objective: There were no vitals filed for this visit.  [name] is a pleasant 83 y.o. female in NAD. AAO X 3.  Title   Diabetic Foot Exam - detailed Date & Time: 12/28/2023  9:30 AM Diabetic Foot exam was performed with the following findings: Yes  Visual Foot Exam completed.: Yes  Is there a history of foot ulcer?: No Is there a foot ulcer now?: No Is there swelling?: No Is there elevated skin temperature?: No Is  there abnormal foot shape?: Yes Is there a claw toe deformity?: No Are the toenails long?: Yes Are the toenails thick?: Yes Are the toenails ingrown?: No Is the skin thin, fragile, shiny and hairless?: No Normal Range of Motion?: Yes Is there foot or ankle muscle weakness?: No Do you have pain in calf while walking?: No Are the shoes appropriate in style and fit?: Yes Can the patient see the bottom of their feet?: Yes Pulse Foot Exam completed.: Yes   Right Posterior Tibialis: Present Left posterior Tibialis: Present   Right Dorsalis Pedis: Present Left Dorsalis Pedis: Present     Sensory Foot Exam Completed.: Yes Semmes-Weinstein Monofilament Test + means has sensation and - means no sensation   R Site 1-Great Toe: Pos L Site 1-Great Toe: Pos   R Site 4: Pos L Site 4: Pos   R site 5: Pos L Site 5: Pos  R Site 6: Pos L Site 6: Pos     Image components are not supported.   Image components are not supported. Image components are not supported.  [place] Right vibratory: present Left vibratory: present  Comments Toenails 2-5 bilaterally and left great toe elongated, discolored, dystrophic, thickened, and crumbly with subungual debris and tenderness to dorsal palpation. Anonychia noted right great toe. Nailbed(s) epithelialized.   Normal muscle strength 5/5 to all lower extremity muscle groups bilaterally. HAV with bunion deformity noted b/l LE.  Hammertoe deformity noted 2-5 b/l.SABRA No pain, crepitus or joint limitation noted with ROM b/l LE.  Patient ambulates independently without assistive aids.      Lab Results  Component Value Date   HGBA1C 5.1 11/11/2022   No results found. ADA Risk Categorization: Low Risk :  Patient has all of the following: Intact protective sensation No prior foot ulcer  No severe deformity Pedal pulses present  High Risk  Patient has one or more of the following: Loss of protective sensation Absent pedal pulses Severe Foot deformity History of foot ulcer  Assessment: 1. Pain due to onychomycosis of toenails of both feet   2. Hallux valgus, acquired, bilateral   3. Acquired hammertoes of both feet   4. Type 2 diabetes mellitus with stage 3a chronic kidney disease, without long-term current use of insulin (HCC)   5. Encounter for diabetic foot exam (HCC)      Plan: -Consent given for treatment as described below: -Examined patient. -Diabetic foot examination performed today. -Continue diabetic foot care principles: inspect feet daily, monitor glucose as recommended by PCP and/or Endocrinologist, and follow prescribed diet per PCP, Endocrinologist and/or dietician. -Toenails were debrided in length and girth 2-5 bilaterally and left great toe with sterile nail nippers and dremel without iatrogenic bleeding.  -Patient/POA to call should there be question/concern in the interim. Return in about 3 months (around 03/26/2024).  Delon LITTIE Merlin, DPM      [facility] LOCATION: [address] [place] [address]                   Office [phone]   [facility] LOCATION: [address] [place], [place] [address] Office [phone]

## 2024-01-26 ENCOUNTER — Telehealth: Payer: Self-pay

## 2024-01-26 DIAGNOSIS — N1831 Chronic kidney disease, stage 3a: Secondary | ICD-10-CM

## 2024-01-26 DIAGNOSIS — N184 Chronic kidney disease, stage 4 (severe): Secondary | ICD-10-CM

## 2024-01-26 DIAGNOSIS — E785 Hyperlipidemia, unspecified: Secondary | ICD-10-CM

## 2024-01-26 NOTE — Telephone Encounter (Signed)
 Copied from CRM [id number]. Topic: Clinical - Lab/Test Results >> Jan 26, 2024  4:41 PM Florestine Avers wrote: Reason for CRM: Patient has an upcoming lab appointment and is requesting for the lab orders to be placed prior to her appointment which is 02/02/2024. Patient is also asking for a call back from the nurse to ensure the orders were placed and she can still go to her upcoming lab appointment.

## 2024-01-31 ENCOUNTER — Other Ambulatory Visit (INDEPENDENT_AMBULATORY_CARE_PROVIDER_SITE_OTHER)

## 2024-01-31 DIAGNOSIS — E785 Hyperlipidemia, unspecified: Secondary | ICD-10-CM

## 2024-01-31 DIAGNOSIS — N1831 Chronic kidney disease, stage 3a: Secondary | ICD-10-CM

## 2024-01-31 DIAGNOSIS — E1122 Type 2 diabetes mellitus with diabetic chronic kidney disease: Secondary | ICD-10-CM | POA: Diagnosis not present

## 2024-01-31 DIAGNOSIS — N184 Chronic kidney disease, stage 4 (severe): Secondary | ICD-10-CM | POA: Diagnosis not present

## 2024-01-31 LAB — COMPREHENSIVE METABOLIC PANEL
ALT: 14 U/L (ref 0–35)
AST: 17 U/L (ref 0–37)
Albumin: 3.6 g/dL (ref 3.5–5.2)
Alkaline Phosphatase: 52 U/L (ref 39–117)
BUN: 45 mg/dL — ABNORMAL HIGH (ref 6–23)
CO2: 23 meq/L (ref 19–32)
Calcium: 8.9 mg/dL (ref 8.4–10.5)
Chloride: 104 meq/L (ref 96–112)
Creatinine, Ser: 2.17 mg/dL — ABNORMAL HIGH (ref 0.40–1.20)
GFR: 20.82 mL/min — ABNORMAL LOW (ref >60.00)
Glucose, Bld: 98 mg/dL (ref 70–99)
Potassium: 4.2 meq/L (ref 3.5–5.1)
Sodium: 136 meq/L (ref 135–145)
Total Bilirubin: 0.4 mg/dL (ref 0.2–1.2)
Total Protein: 6 g/dL (ref 6.0–8.3)

## 2024-01-31 LAB — CBC WITH DIFFERENTIAL/PLATELET
Basophils Absolute: 0 10*3/uL (ref 0.0–0.1)
Basophils Relative: 0.7 % (ref 0.0–3.0)
Eosinophils Absolute: 0.1 10*3/uL (ref 0.0–0.7)
Eosinophils Relative: 1.7 % (ref 0.0–5.0)
HCT: 29.4 % — ABNORMAL LOW (ref 36.0–46.0)
Hemoglobin: 9.7 g/dL — ABNORMAL LOW (ref 12.0–15.0)
Lymphocytes Relative: 45.2 % (ref 12.0–46.0)
Lymphs Abs: 3.4 10*3/uL (ref 0.7–4.0)
MCHC: 33.2 g/dL (ref 30.0–36.0)
MCV: 89 fl (ref 78.0–100.0)
Monocytes Absolute: 0.7 10*3/uL (ref 0.1–1.0)
Monocytes Relative: 9.5 % (ref 3.0–12.0)
Neutro Abs: 3.2 10*3/uL (ref 1.4–7.7)
Neutrophils Relative %: 42.9 % — ABNORMAL LOW (ref 43.0–77.0)
Platelets: 192 10*3/uL (ref 150.0–400.0)
RBC: 3.3 Mil/uL — ABNORMAL LOW (ref 3.87–5.11)
RDW: 14.4 % (ref 11.5–15.5)
WBC: 7.4 10*3/uL (ref 4.0–10.5)

## 2024-01-31 LAB — URINALYSIS, ROUTINE W REFLEX MICROSCOPIC
Bilirubin Urine: NEGATIVE
Hgb urine dipstick: NEGATIVE
Ketones, ur: NEGATIVE
Leukocytes,Ua: NEGATIVE
Nitrite: NEGATIVE
RBC / HPF: NONE SEEN (ref 0–?)
Specific Gravity, Urine: <=1.005 — AB (ref 1.000–1.030)
Total Protein, Urine: NEGATIVE
Urine Glucose: 250 — AB
Urobilinogen, UA: 0.2 (ref 0.0–1.0)
pH: 6 (ref 5.0–8.0)

## 2024-01-31 LAB — LIPID PANEL
Cholesterol: 90 mg/dL (ref 0–200)
HDL: 36.6 mg/dL — ABNORMAL LOW (ref >39.00)
LDL Cholesterol: 43 mg/dL (ref 0–99)
NonHDL: 52.92
Total CHOL/HDL Ratio: 2
Triglycerides: 52 mg/dL (ref 0.0–149.0)
VLDL: 10.4 mg/dL (ref 0.0–40.0)

## 2024-01-31 LAB — MICROALBUMIN / CREATININE URINE RATIO
Creatinine,U: 31.1 mg/dL
Microalb Creat Ratio: 84.2 mg/g — ABNORMAL HIGH (ref 0.0–30.0)
Microalb, Ur: 2.6 mg/dL — ABNORMAL HIGH (ref 0.0–1.9)

## 2024-01-31 LAB — HEMOGLOBIN A1C: Hgb A1c MFr Bld: 5 % (ref 4.6–6.5)

## 2024-02-01 NOTE — Telephone Encounter (Signed)
 Your labs were done yesterday.  Thanks

## 2024-02-02 ENCOUNTER — Encounter: Payer: Self-pay | Admitting: Internal Medicine

## 2024-02-02 ENCOUNTER — Ambulatory Visit (INDEPENDENT_AMBULATORY_CARE_PROVIDER_SITE_OTHER): Payer: Medicare HMO | Admitting: Internal Medicine

## 2024-02-02 VITALS — BP 116/68 | HR 67 | Temp 98.0°F | Ht 63.0 in | Wt 180.0 lb

## 2024-02-02 DIAGNOSIS — E1122 Type 2 diabetes mellitus with diabetic chronic kidney disease: Secondary | ICD-10-CM | POA: Diagnosis not present

## 2024-02-02 DIAGNOSIS — N1831 Chronic kidney disease, stage 3a: Secondary | ICD-10-CM | POA: Diagnosis not present

## 2024-02-02 DIAGNOSIS — H40013 Open angle with borderline findings, low risk, bilateral: Secondary | ICD-10-CM | POA: Diagnosis not present

## 2024-02-02 DIAGNOSIS — N184 Chronic kidney disease, stage 4 (severe): Secondary | ICD-10-CM

## 2024-02-02 DIAGNOSIS — M1000 Idiopathic gout, unspecified site: Secondary | ICD-10-CM | POA: Diagnosis not present

## 2024-02-02 DIAGNOSIS — E538 Deficiency of other specified B group vitamins: Secondary | ICD-10-CM | POA: Diagnosis not present

## 2024-02-02 DIAGNOSIS — Z7984 Long term (current) use of oral hypoglycemic drugs: Secondary | ICD-10-CM

## 2024-02-02 DIAGNOSIS — H524 Presbyopia: Secondary | ICD-10-CM | POA: Diagnosis not present

## 2024-02-02 DIAGNOSIS — Z961 Presence of intraocular lens: Secondary | ICD-10-CM | POA: Diagnosis not present

## 2024-02-02 DIAGNOSIS — I10 Essential (primary) hypertension: Secondary | ICD-10-CM | POA: Diagnosis not present

## 2024-02-02 DIAGNOSIS — H26492 Other secondary cataract, left eye: Secondary | ICD-10-CM | POA: Diagnosis not present

## 2024-02-02 DIAGNOSIS — E119 Type 2 diabetes mellitus without complications: Secondary | ICD-10-CM | POA: Diagnosis not present

## 2024-02-02 NOTE — Assessment & Plan Note (Signed)
Cont w/Allopurinol

## 2024-02-02 NOTE — Assessment & Plan Note (Signed)
Cont on Losartan, Hydralazine

## 2024-02-02 NOTE — Assessment & Plan Note (Addendum)
 Cont w/Hydralazine, Losartan, Furosemide F/u w/Dr Casimiro Needle Drink 16-20 oz of water prior to labs

## 2024-02-02 NOTE — Assessment & Plan Note (Addendum)
 On diet, Bridget Oliver

## 2024-02-02 NOTE — Patient Instructions (Signed)
 Drink 16-20 oz of water prior to labs

## 2024-02-02 NOTE — Assessment & Plan Note (Signed)
Cont on Vit B12 

## 2024-02-02 NOTE — Progress Notes (Signed)
 Subjective:  Patient ID: Bridget Oliver, female    DOB: 1941/10/09  Age: 83 y.o. MRN: [id number]  CC: Follow-up (Abnormal results, discuss labs )   HPI Bridget Oliver presents for CRI, HTN, dyslipidemia  Outpatient Medications Prior to Visit  Medication Sig Dispense Refill   allopurinol (ZYLOPRIM) 100 MG tablet TAKE 1/2 TABLET BY MOUTH DAILY 45 tablet 1   amLODipine (NORVASC) 10 MG tablet Take 1 tablet (10 mg total) by mouth daily. 90 tablet 3   aspirin EC 81 MG EC tablet Take 1 tablet (81 mg total) by mouth daily. Swallow whole. 30 tablet 11   atorvastatin (LIPITOR) 40 MG tablet TAKE 1 TABLET BY MOUTH EVERY DAY 90 tablet 1   carvedilol (COREG) 25 MG tablet TAKE 1 TABLET (25 MG TOTAL) BY MOUTH TWICE A DAY WITH MEALS 180 tablet 1   Cholecalciferol (VITAMIN D3) 2000 UNITS capsule Take 1 capsule (2,000 Units total) by mouth daily. 100 capsule 3   Coenzyme Q10 (COQ10) 100 MG CAPS Take 1 capsule by mouth daily.     COMIRNATY syringe      ezetimibe (ZETIA) 10 MG tablet TAKE 1 TABLET BY MOUTH EVERY DAY 90 tablet 1   FARXIGA 5 MG TABS tablet Take 5 mg by mouth daily.     Fish Oil-Cholecalciferol (FISH OIL + D3) 1200-1000 MG-UNIT CAPS Take 1 capsule by mouth daily.     furosemide (LASIX) 80 MG tablet Take 1 tablet (80 mg total) by mouth daily. 90 tablet 3   hydrALAZINE (APRESOLINE) 100 MG tablet Take 100 mg by mouth 3 (three) times daily.     losartan (COZAAR) 100 MG tablet TAKE 1 TABLET BY MOUTH EVERYDAY AT BEDTIME 90 tablet 1   Multiple Vitamins-Minerals (CENTRUM SILVER PO) Take 1 tablet by mouth daily.      nitroGLYCERIN (NITROSTAT) 0.4 MG SL tablet Place 1 tablet (0.4 mg total) under the tongue every 5 (five) minutes as needed for chest pain. Reported on 05/28/2016 25 tablet 2   ondansetron (ZOFRAN) 4 MG tablet Take 1 tablet (4 mg total) by mouth every 8 (eight) hours as needed for nausea or vomiting. 30 tablet 0   pantoprazole (PROTONIX) 40 MG tablet TAKE 1 TABLET BY MOUTH EVERY DAY 90 tablet 3    Polyethyl Glycol-Propyl Glycol (SYSTANE FREE OP) Place 1 drop into both eyes daily as needed (dry eyes).     Probiotic Product (ALIGN) 4 MG CAPS Take 1 capsule (4 mg total) by mouth daily. 30 capsule 0   vitamin B-12 (CYANOCOBALAMIN) 1000 MCG tablet Take 500 mcg by mouth every other day.      No facility-administered medications prior to visit.    ROS: Review of Systems  Constitutional:  Negative for activity change, appetite change, chills, fatigue and unexpected weight change.  HENT:  Negative for congestion, mouth sores and sinus pressure.   Eyes:  Negative for visual disturbance.  Respiratory:  Negative for cough and chest tightness.   Gastrointestinal:  Negative for abdominal pain and nausea.  Genitourinary:  Negative for difficulty urinating, frequency and vaginal pain.  Musculoskeletal:  Negative for back pain and gait problem.  Skin:  Negative for pallor and rash.  Neurological:  Negative for dizziness, tremors, weakness, numbness and headaches.  Psychiatric/Behavioral:  Negative for confusion and sleep disturbance.     Objective:  BP 116/68 (BP Location: Left Arm, Patient Position: Sitting, Cuff Size: Normal)   Pulse 67   Temp 98 F (36.7 C) (Oral)   Ht  5\' 3"  (1.6 m)   Wt 180 lb (81.6 kg)   SpO2 98%   BMI 31.89 kg/m   BP Readings from Last 3 Encounters:  02/02/24 116/68  12/22/23 (!) 133/47  11/22/23 130/60    Wt Readings from Last 3 Encounters:  02/02/24 180 lb (81.6 kg)  12/28/23 178 lb (80.7 kg)  12/22/23 178 lb (80.7 kg)    Physical Exam Constitutional:      General: She is not in acute distress.    Appearance: She is well-developed. She is obese.  HENT:     Head: Normocephalic.     Right Ear: External ear normal.     Left Ear: External ear normal.     Nose: Nose normal.  Eyes:     General:        Right eye: No discharge.        Left eye: No discharge.     Conjunctiva/sclera: Conjunctivae normal.     Pupils: Pupils are equal, round, and reactive  to light.  Neck:     Thyroid: No thyromegaly.     Vascular: No JVD.     Trachea: No tracheal deviation.  Cardiovascular:     Rate and Rhythm: Normal rate and regular rhythm.     Heart sounds: Normal heart sounds.  Pulmonary:     Effort: No respiratory distress.     Breath sounds: No stridor. No wheezing.  Abdominal:     General: Bowel sounds are normal. There is no distension.     Palpations: Abdomen is soft. There is no mass.     Tenderness: There is no abdominal tenderness. There is no guarding or rebound.  Musculoskeletal:        General: No tenderness.     Cervical back: Normal range of motion and neck supple. No rigidity.  Lymphadenopathy:     Cervical: No cervical adenopathy.  Skin:    Findings: No erythema or rash.  Neurological:     Cranial Nerves: No cranial nerve deficit.     Motor: No abnormal muscle tone.     Coordination: Coordination normal.     Deep Tendon Reflexes: Reflexes normal.  Psychiatric:        Behavior: Behavior normal.        Thought Content: Thought content normal.        Judgment: Judgment normal.     Lab Results  Component Value Date   WBC 7.4 01/31/2024   HGB 9.7 (L) 01/31/2024   HCT 29.4 (L) 01/31/2024   PLT 192.0 01/31/2024   GLUCOSE 98 01/31/2024   CHOL 90 01/31/2024   TRIG 52.0 01/31/2024   HDL 36.60 (L) 01/31/2024   LDLDIRECT 127.9 07/14/2011   LDLCALC 43 01/31/2024   ALT 14 01/31/2024   AST 17 01/31/2024   NA 136 01/31/2024   K 4.2 01/31/2024   CL 104 01/31/2024   CREATININE 2.17 (H) 01/31/2024   BUN 45 (H) 01/31/2024   CO2 23 01/31/2024   TSH 3.27 05/06/2021   INR 0.98 06/07/2014   HGBA1C 5.0 01/31/2024   MICROALBUR 2.6 (H) 01/31/2024    US RENAL ARTERY DUPLEX COMPLETE Result Date: 07/03/2023 CLINICAL DATA:  Stage 4 chronic kidney disease EXAM: RENAL/URINARY TRACT ULTRASOUND RENAL DUPLEX DOPPLER ULTRASOUND COMPARISON:  04/14/2022 FINDINGS: Right Kidney: Length: 10.0 cm. Echogenicity within normal limits. No mass or  hydronephrosis visualized. Left Kidney: Length: 10.1 cm. Echogenicity within normal limits. No mass or hydronephrosis visualized. Bladder:  Evaluation limited by collapsed configuration. RENAL DUPLEX ULTRASOUND  Right Renal Artery Velocities: Origin:  66 cm/sec Mid: Not visualized Hilum:  92 cm/sec Interlobar:  88 cm/sec Arcuate:  64 cm/sec Left Renal Artery Velocities: Origin:  87 cm/sec Mid:  78 cm/sec Hilum:  94 cm/sec Interlobar:  101 cm/sec Arcuate:  74 cm/sec Aortic Velocity:  87 cm/sec Right Renal-Aortic Ratios: Origin: 0.76 Mid:  Not seen Hilum: 1.1 Interlobar: 1.0 Arcuate: 0.74 Left Renal-Aortic Ratios: Origin: 1.0 Mid: 0.9 Hilum: 1.1 Interlobar: 1.2 Arcuate: 0.9 IMPRESSION: No Doppler evidence of significant renal artery stenosis. Electronically Signed   By: Acquanetta Belling M.D.   On: 07/03/2023 07:56    Assessment & Plan:   Problem List Items Addressed This Visit     B12 deficiency   Cont on Vit B12      CKD (chronic kidney disease) stage 4, GFR 15-29 ml/min (HCC) - Primary   Cont w/Hydralazine, Losartan, Furosemide F/u w/Dr Casimiro Needle Drink 16-20 oz of water prior to labs      Relevant Orders   Comprehensive metabolic panel   CBC with Differential/Platelet   DM (diabetes mellitus), type 2 with renal complications (HCC)   On diet, Farxiga      Relevant Orders   Comprehensive metabolic panel   CBC with Differential/Platelet   Gout   Cont w/Allopurinol      Hypertension   Cont on Losartan, Hydralazine          No orders of the defined types were placed in this encounter.     Follow-up: Return in about 6 months (around 08/04/2024) for Wellness Exam.  Sonda Primes, MD

## 2024-03-02 ENCOUNTER — Other Ambulatory Visit: Payer: Self-pay | Admitting: Internal Medicine

## 2024-03-30 ENCOUNTER — Encounter: Payer: Self-pay | Admitting: Podiatry

## 2024-03-30 ENCOUNTER — Ambulatory Visit: Payer: Medicare HMO | Admitting: Podiatry

## 2024-03-30 DIAGNOSIS — B351 Tinea unguium: Secondary | ICD-10-CM

## 2024-03-30 DIAGNOSIS — E1122 Type 2 diabetes mellitus with diabetic chronic kidney disease: Secondary | ICD-10-CM | POA: Diagnosis not present

## 2024-03-30 DIAGNOSIS — M79674 Pain in right toe(s): Secondary | ICD-10-CM | POA: Diagnosis not present

## 2024-03-30 DIAGNOSIS — N1831 Chronic kidney disease, stage 3a: Secondary | ICD-10-CM | POA: Diagnosis not present

## 2024-03-30 DIAGNOSIS — M79675 Pain in left toe(s): Secondary | ICD-10-CM

## 2024-03-30 NOTE — Progress Notes (Signed)
  Subjective:  Patient ID: Bridget Oliver, female    DOB: 10/30/1941,  MRN: [id number]  83 y.o. female presents to clinic with  at risk foot care. Pt has h/o NIDDM with chronic kidney disease and painful mycotic toenails of both feet that are difficult to trim. Pain interferes with daily activities and wearing enclosed shoe gear comfortably.  Chief Complaint  Patient presents with   Diabetes    "A regular clipping of the toenails"  Dr. Georgia Kipper - 02/02/2024; Alc - 5.0    New problem(s): None   PCP is [name], Oakley Bellman, MD.  Allergies  Allergen Reactions   Oxycodone Other (See Comments)    Nausea Can take codein    Review of Systems: Negative except as noted in the HPI.   Objective:  Bridget Oliver is a pleasant 83 y.o. female WD, WN in NAD. AAO x 3.  Vascular Examination: Vascular status intact b/l with palpable pedal pulses. CFT immediate b/l. No edema. No pain with calf compression b/l. Skin temperature gradient WNL b/l. No edema noted b/l LE.  Neurological Examination: Sensation grossly intact b/l with 10 gram monofilament.   Dermatological Examination: Pedal skin with normal turgor, texture and tone b/l. Toenails left great toe and 2-5 b/l thick, discolored, elongated with subungual debris and pain on dorsal palpation. Anonychia right hallux. No hyperkeratotic lesions noted b/l.   Musculoskeletal Examination: Muscle strength 5/5 to b/l LE. HAV with bunion bilaterally and hammertoes 2-5 b/l.  Radiographs: None  Last A1c:      Latest Ref Rng & Units 01/31/2024    8:09 AM  Hemoglobin A1C  Hemoglobin-A1c 4.6 - 6.5 % 5.0      Assessment:   1. Pain due to onychomycosis of toenails of both feet   2. Type 2 diabetes mellitus with stage 3a chronic kidney disease, without long-term current use of insulin (HCC)    Plan:  -Patient was evaluated today. All questions/concerns addressed on today's visit. -Continue foot and shoe inspections daily. Monitor blood glucose per  PCP/Endocrinologist's recommendations. -Patient to continue soft, supportive shoe gear daily. -Mycotic toenails 2-5 bilaterally and L hallux were debrided in length and girth with sterile nail nippers and dremel without iatrogenic bleeding. -Patient/POA to call should there be question/concern in the interim.  Return in about 3 months (around 06/30/2024).  Bridget Oliver, DPM      [facility] LOCATION: [address] [place] [address]                   Office [phone]   [facility] LOCATION: [address] [place], [place] [address] Office [phone]

## 2024-04-14 ENCOUNTER — Telehealth: Payer: Self-pay

## 2024-04-14 NOTE — Telephone Encounter (Addendum)
 This patient is appearing on a report for being at risk of failing the adherence measure for cholesterol (statin) medications this calendar year.   Medication: atorvastatin  40 mg daily Last fill date: 01/13/24 for 50 day supply  Good adherence with previous fills. Overdue for this past refill as anticipated patient would have ran out of medication around mid-April. Will need new script sent in if patient is out of medication at home. Last PCP visit 02/02/24, next visit 05/22/24. Left voicemail for patient to return call at her earliest convenience.  Abelina Abide, PharmD PGY1 Pharmacy Resident 04/14/2024 12:02 PM

## 2024-05-17 ENCOUNTER — Other Ambulatory Visit (INDEPENDENT_AMBULATORY_CARE_PROVIDER_SITE_OTHER)

## 2024-05-17 DIAGNOSIS — N184 Chronic kidney disease, stage 4 (severe): Secondary | ICD-10-CM | POA: Diagnosis not present

## 2024-05-17 DIAGNOSIS — E1122 Type 2 diabetes mellitus with diabetic chronic kidney disease: Secondary | ICD-10-CM

## 2024-05-17 DIAGNOSIS — N1831 Chronic kidney disease, stage 3a: Secondary | ICD-10-CM

## 2024-05-17 LAB — COMPREHENSIVE METABOLIC PANEL WITH GFR
ALT: 11 U/L (ref 0–35)
AST: 15 U/L (ref 0–37)
Albumin: 3.6 g/dL (ref 3.5–5.2)
Alkaline Phosphatase: 48 U/L (ref 39–117)
BUN: 53 mg/dL — ABNORMAL HIGH (ref 6–23)
CO2: 25 meq/L (ref 19–32)
Calcium: 9.2 mg/dL (ref 8.4–10.5)
Chloride: 100 meq/L (ref 96–112)
Creatinine, Ser: 2.73 mg/dL — ABNORMAL HIGH (ref 0.40–1.20)
GFR: 15.77 mL/min — ABNORMAL LOW (ref >60.00)
Glucose, Bld: 99 mg/dL (ref 70–99)
Potassium: 4.4 meq/L (ref 3.5–5.1)
Sodium: 134 meq/L — ABNORMAL LOW (ref 135–145)
Total Bilirubin: 0.4 mg/dL (ref 0.2–1.2)
Total Protein: 6.2 g/dL (ref 6.0–8.3)

## 2024-05-17 LAB — CBC WITH DIFFERENTIAL/PLATELET
Basophils Absolute: 0.1 10*3/uL (ref 0.0–0.1)
Basophils Relative: 0.9 % (ref 0.0–3.0)
Eosinophils Absolute: 0.2 10*3/uL (ref 0.0–0.7)
Eosinophils Relative: 2.1 % (ref 0.0–5.0)
HCT: 28.2 % — ABNORMAL LOW (ref 36.0–46.0)
Hemoglobin: 9.5 g/dL — ABNORMAL LOW (ref 12.0–15.0)
Lymphocytes Relative: 39.1 % (ref 12.0–46.0)
Lymphs Abs: 2.9 10*3/uL (ref 0.7–4.0)
MCHC: 33.7 g/dL (ref 30.0–36.0)
MCV: 85.8 fl (ref 78.0–100.0)
Monocytes Absolute: 0.8 10*3/uL (ref 0.1–1.0)
Monocytes Relative: 10.1 % (ref 3.0–12.0)
Neutro Abs: 3.6 10*3/uL (ref 1.4–7.7)
Neutrophils Relative %: 47.8 % (ref 43.0–77.0)
Platelets: 175 10*3/uL (ref 150.0–400.0)
RBC: 3.29 Mil/uL — ABNORMAL LOW (ref 3.87–5.11)
RDW: 13.8 % (ref 11.5–15.5)
WBC: 7.5 10*3/uL (ref 4.0–10.5)

## 2024-05-18 ENCOUNTER — Ambulatory Visit: Payer: Self-pay | Admitting: Internal Medicine

## 2024-05-19 DIAGNOSIS — Z1231 Encounter for screening mammogram for malignant neoplasm of breast: Secondary | ICD-10-CM | POA: Diagnosis not present

## 2024-05-19 LAB — HM MAMMOGRAPHY

## 2024-05-22 ENCOUNTER — Encounter: Payer: Self-pay | Admitting: Internal Medicine

## 2024-05-22 ENCOUNTER — Ambulatory Visit (INDEPENDENT_AMBULATORY_CARE_PROVIDER_SITE_OTHER): Payer: Medicare HMO | Admitting: Internal Medicine

## 2024-05-22 ENCOUNTER — Other Ambulatory Visit: Payer: Self-pay | Admitting: Internal Medicine

## 2024-05-22 VITALS — BP 118/70 | HR 67 | Temp 97.9°F | Ht 63.0 in | Wt 179.0 lb

## 2024-05-22 DIAGNOSIS — D631 Anemia in chronic kidney disease: Secondary | ICD-10-CM | POA: Diagnosis not present

## 2024-05-22 DIAGNOSIS — E538 Deficiency of other specified B group vitamins: Secondary | ICD-10-CM | POA: Diagnosis not present

## 2024-05-22 DIAGNOSIS — N184 Chronic kidney disease, stage 4 (severe): Secondary | ICD-10-CM

## 2024-05-22 DIAGNOSIS — M1000 Idiopathic gout, unspecified site: Secondary | ICD-10-CM

## 2024-05-22 DIAGNOSIS — R252 Cramp and spasm: Secondary | ICD-10-CM | POA: Insufficient documentation

## 2024-05-22 MED ORDER — PITAVASTATIN CALCIUM 2 MG PO TABS: 2.0000 mg | ORAL_TABLET | Freq: Every day | ORAL | 11 refills | Status: DC

## 2024-05-22 NOTE — Assessment & Plan Note (Addendum)
 Appt w/Dr Gearline is pending Monitor Hgb

## 2024-05-22 NOTE — Assessment & Plan Note (Signed)
 Cont w/Hydralazine, Losartan, Furosemide F/u w/Dr Casimiro Needle Drink 16-20 oz of water prior to labs

## 2024-05-22 NOTE — Progress Notes (Signed)
 Subjective:  Patient ID: Bridget Oliver, female    DOB: 1941/01/21  Age: 83 y.o. MRN: [id number]  CC: Medical Management of Chronic Issues (6 MNTH F/U, Discuss Atorvastatin  as pt is having the cramping in her legs side effect a/w having received a re-call notice from her pharmacy about her medication )   HPI Bridget Oliver presents for dyslipidemia, CRI, HTN C/o severe cramps on Lipitor  Outpatient Medications Prior to Visit  Medication Sig Dispense Refill   allopurinol  (ZYLOPRIM ) 100 MG tablet TAKE 1/2 TABLET BY MOUTH DAILY 45 tablet 1   amLODipine  (NORVASC ) 10 MG tablet Take 1 tablet (10 mg total) by mouth daily. 90 tablet 3   aspirin  EC 81 MG EC tablet Take 1 tablet (81 mg total) by mouth daily. Swallow whole. 30 tablet 11   carvedilol  (COREG ) 25 MG tablet TAKE 1 TABLET (25 MG TOTAL) BY MOUTH TWICE A DAY WITH MEALS 180 tablet 1   Cholecalciferol (VITAMIN D3) 2000 UNITS capsule Take 1 capsule (2,000 Units total) by mouth daily. 100 capsule 3   Coenzyme Q10 (COQ10) 100 MG CAPS Take 1 capsule by mouth daily.     COMIRNATY syringe      ezetimibe  (ZETIA ) 10 MG tablet TAKE 1 TABLET BY MOUTH EVERY DAY 90 tablet 1   FARXIGA 5 MG TABS tablet Take 5 mg by mouth daily.     Fish Oil-Cholecalciferol (FISH OIL + D3) 1200-1000 MG-UNIT CAPS Take 1 capsule by mouth daily.     furosemide  (LASIX ) 80 MG tablet Take 1 tablet (80 mg total) by mouth daily. 90 tablet 3   hydrALAZINE  (APRESOLINE ) 100 MG tablet Take 100 mg by mouth 3 (three) times daily.     losartan  (COZAAR ) 100 MG tablet TAKE 1 TABLET BY MOUTH EVERYDAY AT BEDTIME 90 tablet 1   Multiple Vitamins-Minerals (CENTRUM SILVER PO) Take 1 tablet by mouth daily.      nitroGLYCERIN  (NITROSTAT ) 0.4 MG SL tablet Place 1 tablet (0.4 mg total) under the tongue every 5 (five) minutes as needed for chest pain. Reported on 05/28/2016 25 tablet 2   ondansetron  (ZOFRAN ) 4 MG tablet Take 1 tablet (4 mg total) by mouth every 8 (eight) hours as needed for nausea or  vomiting. 30 tablet 0   pantoprazole  (PROTONIX ) 40 MG tablet TAKE 1 TABLET BY MOUTH EVERY DAY 90 tablet 3   Polyethyl Glycol-Propyl Glycol (SYSTANE FREE OP) Place 1 drop into both eyes daily as needed (dry eyes).     Probiotic Product (ALIGN) 4 MG CAPS Take 1 capsule (4 mg total) by mouth daily. 30 capsule 0   spironolactone (ALDACTONE) 25 MG tablet Take 25 mg by mouth daily.     vitamin B-12 (CYANOCOBALAMIN ) 1000 MCG tablet Take 500 mcg by mouth every other day.      atorvastatin  (LIPITOR) 40 MG tablet TAKE 1 TABLET BY MOUTH EVERY DAY 90 tablet 1   No facility-administered medications prior to visit.    ROS: Review of Systems  Constitutional:  Negative for activity change, appetite change, chills, fatigue and unexpected weight change.  HENT:  Negative for congestion, mouth sores and sinus pressure.   Eyes:  Negative for visual disturbance.  Respiratory:  Negative for cough and chest tightness.   Gastrointestinal:  Negative for abdominal pain and nausea.  Genitourinary:  Negative for difficulty urinating, frequency and vaginal pain.  Musculoskeletal:  Positive for arthralgias and myalgias. Negative for back pain and gait problem.  Skin:  Negative for pallor and rash.  Neurological:  Negative for dizziness, tremors, weakness, numbness and headaches.  Psychiatric/Behavioral:  Negative for confusion and sleep disturbance.     Objective:  BP 118/70   Pulse 67   Temp 97.9 F (36.6 C) (Oral)   Ht 5' 3 (1.6 m)   Wt 179 lb (81.2 kg)   SpO2 97%   BMI 31.71 kg/m   BP Readings from Last 3 Encounters:  05/22/24 118/70  02/02/24 116/68  12/22/23 (!) 133/47    Wt Readings from Last 3 Encounters:  05/22/24 179 lb (81.2 kg)  02/02/24 180 lb (81.6 kg)  12/28/23 178 lb (80.7 kg)    Physical Exam Constitutional:      General: She is not in acute distress.    Appearance: She is well-developed. She is obese.  HENT:     Head: Normocephalic.     Right Ear: External ear normal.      Left Ear: External ear normal.     Nose: Nose normal.   Eyes:     General:        Right eye: No discharge.        Left eye: No discharge.     Conjunctiva/sclera: Conjunctivae normal.     Pupils: Pupils are equal, round, and reactive to light.   Neck:     Thyroid : No thyromegaly.     Vascular: No JVD.     Trachea: No tracheal deviation.   Cardiovascular:     Rate and Rhythm: Normal rate and regular rhythm.     Heart sounds: Normal heart sounds.  Pulmonary:     Effort: No respiratory distress.     Breath sounds: No stridor. No wheezing.  Abdominal:     General: Bowel sounds are normal. There is no distension.     Palpations: Abdomen is soft. There is no mass.     Tenderness: There is no abdominal tenderness. There is no guarding or rebound.   Musculoskeletal:        General: No tenderness.     Cervical back: Normal range of motion and neck supple. No rigidity.  Lymphadenopathy:     Cervical: No cervical adenopathy.   Skin:    Findings: No erythema or rash.   Neurological:     Cranial Nerves: No cranial nerve deficit.     Motor: No abnormal muscle tone.     Coordination: Coordination normal.     Deep Tendon Reflexes: Reflexes normal.   Psychiatric:        Behavior: Behavior normal.        Thought Content: Thought content normal.        Judgment: Judgment normal.     Lab Results  Component Value Date   WBC 7.5 05/17/2024   HGB 9.5 (L) 05/17/2024   HCT 28.2 (L) 05/17/2024   PLT 175.0 05/17/2024   GLUCOSE 99 05/17/2024   CHOL 90 01/31/2024   TRIG 52.0 01/31/2024   HDL 36.60 (L) 01/31/2024   LDLDIRECT 127.9 07/14/2011   LDLCALC 43 01/31/2024   ALT 11 05/17/2024   AST 15 05/17/2024   NA 134 (L) 05/17/2024   K 4.4 05/17/2024   CL 100 05/17/2024   CREATININE 2.73 (H) 05/17/2024   BUN 53 (H) 05/17/2024   CO2 25 05/17/2024   TSH 3.27 05/06/2021   INR 0.98 06/07/2014   HGBA1C 5.0 01/31/2024   MICROALBUR 2.6 (H) 01/31/2024    US  RENAL ARTERY DUPLEX  COMPLETE Result Date: 07/03/2023 CLINICAL DATA:  Stage 4 chronic kidney disease  EXAM: RENAL/URINARY TRACT ULTRASOUND RENAL DUPLEX DOPPLER ULTRASOUND COMPARISON:  04/14/2022 FINDINGS: Right Kidney: Length: 10.0 cm. Echogenicity within normal limits. No mass or hydronephrosis visualized. Left Kidney: Length: 10.1 cm. Echogenicity within normal limits. No mass or hydronephrosis visualized. Bladder:  Evaluation limited by collapsed configuration. RENAL DUPLEX ULTRASOUND Right Renal Artery Velocities: Origin:  66 cm/sec Mid: Not visualized Hilum:  92 cm/sec Interlobar:  88 cm/sec Arcuate:  64 cm/sec Left Renal Artery Velocities: Origin:  87 cm/sec Mid:  78 cm/sec Hilum:  94 cm/sec Interlobar:  101 cm/sec Arcuate:  74 cm/sec Aortic Velocity:  87 cm/sec Right Renal-Aortic Ratios: Origin: 0.76 Mid:  Not seen Hilum: 1.1 Interlobar: 1.0 Arcuate: 0.74 Left Renal-Aortic Ratios: Origin: 1.0 Mid: 0.9 Hilum: 1.1 Interlobar: 1.2 Arcuate: 0.9 IMPRESSION: No Doppler evidence of significant renal artery stenosis. Electronically Signed   By: Aliene Lloyd M.D.   On: 07/03/2023 07:56    Assessment & Plan:   Problem List Items Addressed This Visit     B12 deficiency - Primary   Cont on Vit B12      Gout   Cont w/Allopurinol  - no relapse      OBESITY, MORBID   Wt Readings from Last 3 Encounters:  05/22/24 179 lb (81.2 kg)  02/02/24 180 lb (81.6 kg)  12/28/23 178 lb (80.7 kg)  On diet       CKD (chronic kidney disease) stage 4, GFR 15-29 ml/min (HCC)   Cont w/Hydralazine , Losartan , Furosemide  F/u w/Dr FORBES Bonine Drink 16-20 oz of water prior to labs      Muscle cramps   Worse D/c Lipitor Start Pitavastatin      Anemia in chronic renal disease   Appt w/Dr Bonine is pending Monitor Hgb          Meds ordered this encounter  Medications   Pitavastatin Calcium  2 MG TABS    Sig: Take 1 tablet (2 mg total) by mouth daily.    Dispense:  30 tablet    Refill:  11    Lipitor intolerant       Follow-up: Return in about 6 months (around 11/21/2024) for a follow-up visit.  Marolyn Noel, MD

## 2024-05-22 NOTE — Assessment & Plan Note (Signed)
 Worse D/c Lipitor Start Pitavastatin

## 2024-05-22 NOTE — Assessment & Plan Note (Signed)
Cont on Vit B12 

## 2024-05-22 NOTE — Assessment & Plan Note (Signed)
 Cont w/Allopurinol  - no relapse

## 2024-05-22 NOTE — Assessment & Plan Note (Signed)
 Wt Readings from Last 3 Encounters:  05/22/24 179 lb (81.2 kg)  02/02/24 180 lb (81.6 kg)  12/28/23 178 lb (80.7 kg)  On diet

## 2024-05-25 ENCOUNTER — Other Ambulatory Visit: Payer: Self-pay

## 2024-05-25 MED ORDER — ATORVASTATIN CALCIUM 10 MG PO TABS: 10.0000 mg | ORAL_TABLET | Freq: Every day | ORAL | 1 refills | Status: DC

## 2024-05-25 MED ORDER — ATORVASTATIN CALCIUM 10 MG PO TABS: 10.0000 mg | ORAL_TABLET | Freq: Every day | ORAL | 1 refills | Status: AC

## 2024-05-27 ENCOUNTER — Other Ambulatory Visit: Payer: Self-pay | Admitting: Internal Medicine

## 2024-06-02 DIAGNOSIS — E669 Obesity, unspecified: Secondary | ICD-10-CM | POA: Diagnosis not present

## 2024-06-02 DIAGNOSIS — I129 Hypertensive chronic kidney disease with stage 1 through stage 4 chronic kidney disease, or unspecified chronic kidney disease: Secondary | ICD-10-CM | POA: Diagnosis not present

## 2024-06-02 DIAGNOSIS — E1122 Type 2 diabetes mellitus with diabetic chronic kidney disease: Secondary | ICD-10-CM | POA: Diagnosis not present

## 2024-06-02 DIAGNOSIS — N184 Chronic kidney disease, stage 4 (severe): Secondary | ICD-10-CM | POA: Diagnosis not present

## 2024-06-13 ENCOUNTER — Encounter: Payer: Self-pay | Admitting: Nephrology

## 2024-06-13 LAB — LAB REPORT - SCANNED
Albumin, Urine POC: 14
Creatinine, POC: 63.7 mg/dL
EGFR: 19
Microalb Creat Ratio: 22

## 2024-06-14 ENCOUNTER — Encounter: Payer: Self-pay | Admitting: Internal Medicine

## 2024-06-14 ENCOUNTER — Ambulatory Visit: Attending: Cardiovascular Disease | Admitting: Internal Medicine

## 2024-06-14 VITALS — BP 132/40 | HR 55 | Ht 63.0 in | Wt 180.0 lb

## 2024-06-14 DIAGNOSIS — I739 Peripheral vascular disease, unspecified: Secondary | ICD-10-CM | POA: Diagnosis not present

## 2024-06-14 DIAGNOSIS — I10 Essential (primary) hypertension: Secondary | ICD-10-CM | POA: Diagnosis not present

## 2024-06-14 DIAGNOSIS — E785 Hyperlipidemia, unspecified: Secondary | ICD-10-CM

## 2024-06-14 DIAGNOSIS — I6523 Occlusion and stenosis of bilateral carotid arteries: Secondary | ICD-10-CM | POA: Diagnosis not present

## 2024-06-14 NOTE — Progress Notes (Signed)
 OFFICE CONSULT NOTE  Chief Complaint:  No complaints  Primary Care Physician: [name], Karlynn GAILS, MD  HPI:  Bridget Oliver is a 83 y.o. female who is being seen today for the evaluation of chest pain at the request of [name], Karlynn GAILS, MD. This is a 83 year old female who previously saw Dr. Jeffrie in the hospital in 2015 for chest pain.  This was felt to be atypical at the time and she underwent Lexiscan  Myoview  stress testing which was negative for ischemia.  Recently she describes chest pain which is at rest.  She says is worse laying down or being in bed but not associated with exertion.  She said she could take aspirin  with complete resolution of her symptoms.  It was also felt that she might have reflux and she was started on Protonix .  She says she has had no further episodes since that was started a few months ago.  She also struggles from gout and has high blood pressure which is primarily on her mother side of the family as well as dyslipidemia on statin.  Family history is also significant for lung and pancreatic cancer.  Currently she says her chest pain symptoms have completely resolved.  02/13/2022  Bridget Oliver is seen today to re-establish care.  I last saw her in 2015 at which time she had had chest pain which is felt to be atypical for ischemia.  She underwent Lexiscan  Myoview  stress testing which was negative.  She has had issues with difficult to control hypertension.  She was admitted in November with hypertensive emergency and had stress testing at that time which was negative for ischemia.  Subsequently she had developed symptoms of claudication and underwent lower extremity arterial Dopplers which indicated moderate right and lower extremity arterial disease.  She does say that she can walk about 200 feet and then gets pain in her legs which improves after she rests.  She is on aspirin  and a statin.  Her last lipid profile showed total cholesterol 151, triglycerides 50, HDL  38 and LDL 103 on atorvastatin  40 mg daily.  She also takes quite a few medicines for hypertension including amlodipine  10 mg daily, carvedilol  25 mg twice daily, hydralazine  75 mg 3 times daily, losartan  100 mg daily and minoxidil  2.5 mg daily.  Despite this, blood pressure in the office today actually was quite low in fact 2 manual blood pressures were measured at 92/40.  I personally took the blood pressure it was 100/40 and the Dinamap blood pressure was 103/52.  She reported lightheadedness and while examining her had a near syncopal episode.  06/14/2024  Bridget Oliver is seen today in follow-up.  Overall she is doing fairly well without complaints.  She does report some leg pain with walking and has a history of some moderately reduced ABIs.  She says she often has to stop while grocery shopping and once had her legs give out under her but she was able to catch herself on the shopping cart.  Her blood pressure is reasonably controlled today.  She did have lipids in March of this year showing total cholesterol 90, HDL 36, LDL 43 and triglycerides 52.  EKG today shows sinus bradycardia with a first-degree AV block and is unchanged from prior studies.  She denies any further presyncopal episodes.  PMHx:  Past Medical History:  Diagnosis Date   CRI (chronic renal insufficiency)    Stage 3   Diabetes mellitus 2011   Type II diet  controlled   Diarrhea    GERD (gastroesophageal reflux disease)    Gout    Hyperglycemia    Hyperlipidemia    Hypertension    Lactose intolerance    Macular degeneration    Obesity    Swelling of lower limb     Past Surgical History:  Procedure Laterality Date   ABDOMINAL HYSTERECTOMY  1985   APPENDECTOMY  1985   CATARACT EXTRACTION  2010 & 2011   bilateral   CESAREAN SECTION  1969   LUMBAR LAMINECTOMY  2007   PARS PLANA VITRECTOMY W/ REPAIR OF MACULAR HOLE  2010 & 2011   Bilateral    FAMHx:  Family History  Problem Relation Age of Onset   Hypertension  Mother    Hypertension Father    Lung cancer Father        smoker   Colon cancer Neg Hx    Esophageal cancer Neg Hx    Stomach cancer Neg Hx    Rectal cancer Neg Hx     SOCHx:   reports that she has never smoked. She has never used smokeless tobacco. She reports current alcohol use. She reports that she does not use drugs.  ALLERGIES:  Allergies  Allergen Reactions   Atorvastatin      Bad cramps   Oxycodone Other (See Comments)    Nausea Can take codein    ROS: Pertinent items noted in HPI and remainder of comprehensive ROS otherwise negative.  HOME MEDS: Current Outpatient Medications on File Prior to Visit  Medication Sig Dispense Refill   allopurinol  (ZYLOPRIM ) 100 MG tablet TAKE 1/2 TABLET BY MOUTH DAILY 45 tablet 1   amLODipine  (NORVASC ) 10 MG tablet Take 1 tablet (10 mg total) by mouth daily. 90 tablet 3   aspirin  EC 81 MG EC tablet Take 1 tablet (81 mg total) by mouth daily. Swallow whole. 30 tablet 11   atorvastatin  (LIPITOR) 10 MG tablet Take 1 tablet (10 mg total) by mouth daily. 90 tablet 1   carvedilol  (COREG ) 25 MG tablet TAKE 1 TABLET (25 MG TOTAL) BY MOUTH TWICE A DAY WITH MEALS 180 tablet 1   Cholecalciferol (VITAMIN D3) 2000 UNITS capsule Take 1 capsule (2,000 Units total) by mouth daily. 100 capsule 3   Coenzyme Q10 (COQ10) 100 MG CAPS Take 1 capsule by mouth daily.     COMIRNATY syringe      ezetimibe  (ZETIA ) 10 MG tablet TAKE 1 TABLET BY MOUTH EVERY DAY 90 tablet 1   FARXIGA 5 MG TABS tablet Take 5 mg by mouth daily.     Fish Oil-Cholecalciferol (FISH OIL + D3) 1200-1000 MG-UNIT CAPS Take 1 capsule by mouth daily.     furosemide  (LASIX ) 80 MG tablet Take 1 tablet (80 mg total) by mouth daily. 90 tablet 3   hydrALAZINE  (APRESOLINE ) 100 MG tablet Take 100 mg by mouth 3 (three) times daily.     losartan  (COZAAR ) 100 MG tablet TAKE 1 TABLET BY MOUTH EVERYDAY AT BEDTIME 90 tablet 1   MAGNESIUM GLUCONATE PO Take by mouth.     Multiple Vitamins-Minerals  (CENTRUM SILVER PO) Take 1 tablet by mouth daily.      nitroGLYCERIN  (NITROSTAT ) 0.4 MG SL tablet Place 1 tablet (0.4 mg total) under the tongue every 5 (five) minutes as needed for chest pain. Reported on 05/28/2016 25 tablet 2   ondansetron  (ZOFRAN ) 4 MG tablet Take 1 tablet (4 mg total) by mouth every 8 (eight) hours as needed for nausea or vomiting. 30  tablet 0   pantoprazole  (PROTONIX ) 40 MG tablet TAKE 1 TABLET BY MOUTH EVERY DAY 90 tablet 3   Polyethyl Glycol-Propyl Glycol (SYSTANE FREE OP) Place 1 drop into both eyes daily as needed (dry eyes).     Probiotic Product (ALIGN) 4 MG CAPS Take 1 capsule (4 mg total) by mouth daily. 30 capsule 0   spironolactone (ALDACTONE) 25 MG tablet Take 25 mg by mouth daily.     vitamin B-12 (CYANOCOBALAMIN ) 1000 MCG tablet Take 500 mcg by mouth every other day.      No current facility-administered medications on file prior to visit.    LABS/IMAGING: Results for orders placed or performed in visit on 06/13/24 (from the past 48 hours)  Lab report - scanned     Status: None   Collection Time: 06/13/24  9:00 AM  Result Value Ref Range   Creatinine, POC 63.7 mg/dL    Comment: ABS BY HIM   Albumin, Urine POC 14    Microalb Creat Ratio 22    EGFR 19.0    No results found.  LIPID PANEL:    Component Value Date/Time   CHOL 90 01/31/2024 0809   CHOL 109 05/05/2023 0946   TRIG 52.0 01/31/2024 0809   HDL 36.60 (L) 01/31/2024 0809   HDL 44 05/05/2023 0946   CHOLHDL 2 01/31/2024 0809   VLDL 10.4 01/31/2024 0809   LDLCALC 43 01/31/2024 0809   LDLCALC 51 05/05/2023 0946   LDLDIRECT 127.9 07/14/2011 0747    WEIGHTS: Wt Readings from Last 3 Encounters:  06/14/24 180 lb (81.6 kg)  05/22/24 179 lb (81.2 kg)  02/02/24 180 lb (81.6 kg)    VITALS: BP (!) 132/40   Pulse (!) 55   Ht 5' 3 (1.6 m)   Wt 180 lb (81.6 kg)   SpO2 97%   BMI 31.89 kg/m   EXAM: General appearance: alert, no distress and morbidly obese Neck: no carotid bruit, no JVD  and thyroid  not enlarged, symmetric, no tenderness/mass/nodules Lungs: clear to auscultation bilaterally Heart: regular rate and rhythm Abdomen: soft, non-tender; bowel sounds normal; no masses,  no organomegaly Extremities: extremities normal, atraumatic, no cyanosis or edema Pulses: 2+ and symmetric Skin: Skin color, texture, turgor normal. No rashes or lesions Neurologic: Grossly normal Psych: Pleasant  EKG: EKG Interpretation Date/Time:  Wednesday June 14 2024 11:08:16 EDT Ventricular Rate:  55 PR Interval:  236 QRS Duration:  82 QT Interval:  426 QTC Calculation: 407 R Axis:   17  Text Interpretation: Sinus bradycardia with 1st degree A-V block Nonspecific T wave abnormality When compared with ECG of 29-Sep-2021 17:25, PR interval has increased T wave inversion no longer evident in Lateral leads Confirmed by Mona Kent [phone]) on 06/14/2024 11:25:16 AM    ASSESSMENT: Difficult to control hypertension-now hypotensive and symptomatic today History of chest pain-negative Myoview  stress test (09/2021) PAD with claudication and bilateral moderate arterial insufficiency Morbid obesity Hypertension Dyslipidemia, goal LDL less than 70  PLAN: 1.   Bridget Oliver is describing some claudication and was noted to have some moderate arterial insufficiency on her previous Dopplers.  I would like for her to undergo repeat lower extremity Dopplers.  Blood pressure appears well-controlled.  Her lipids are at target.  She denies any chest pain.  Will contact her with the results of those studies.  She already is on aspirin .  Plan follow-up in 37-months or sooner as necessary.  Kent KYM Mona, MD, [facility], FNLA, FACP  [place]  [facility]  Medical Director of the [facility]  [facility] Diplomate of the American Board of Clinical Lipidology Attending Cardiologist  Direct Dial: [phone]  Fax: [phone]  Website:   [email or web address] Vinie BROCKS Youssouf Shipley 06/14/2024, 11:25 AM

## 2024-06-14 NOTE — Patient Instructions (Signed)
 Medication Instructions:  Your physician recommends that you continue on your current medications as directed. Please refer to the Current Medication list given to you today.  *If you need a refill on your cardiac medications before your next appointment, please call your pharmacy*  Testing/Procedures: Your physician has requested that you have a lower or upper extremity arterial duplex. This test is an ultrasound of the arteries in the legs or arms. It looks at arterial blood flow in the legs and arms. Allow one hour for Lower and Upper Arterial scans. There are no restrictions or special instructions.  Please note: We ask at that you not bring children with you during ultrasound (echo/ vascular) testing. Due to room size and safety concerns, children are not allowed in the ultrasound rooms during exams. Our front office staff cannot provide observation of children in our lobby area while testing is being conducted. An adult accompanying a patient to their appointment will only be allowed in the ultrasound room at the discretion of the ultrasound technician under special circumstances. We apologize for any inconvenience.   Your physician has requested that you have an ankle brachial index (ABI). During this test an ultrasound and blood pressure cuff are used to evaluate the arteries that supply the arms and legs with blood. Allow thirty minutes for this exam. There are no restrictions or special instructions.  Please note: We ask at that you not bring children with you during ultrasound (echo/ vascular) testing. Due to room size and safety concerns, children are not allowed in the ultrasound rooms during exams. Our front office staff cannot provide observation of children in our lobby area while testing is being conducted. An adult accompanying a patient to their appointment will only be allowed in the ultrasound room at the discretion of the ultrasound technician under special circumstances. We  apologize for any inconvenience.   Follow-Up: At [facility], you and your health needs are our priority.  As part of our continuing mission to provide you with exceptional heart care, our providers are all part of one team.  This team includes your primary Cardiologist (physician) and Advanced Practice Providers or APPs (Physician Assistants and Nurse Practitioners) who all work together to provide you with the care you need, when you need it.  Your next appointment:   6 month(s)  Provider:   One of our Advanced Practice Providers (APPs): Morse Clause, PA-C  Lamarr Satterfield, NP Miriam Shams, NP  Olivia Pavy, PA-C Josefa Beauvais, NP  Leontine Salen, PA-C Orren Fabry, PA-C  Shannon, PA-C Ernest Dick, NP  Damien Braver, NP Jon Hails, PA-C  Waddell Donath, PA-C    Dayna Dunn, PA-C  Scott Weaver, PA-C Lum Louis, NP Katlyn West, NP Callie Goodrich, PA-C  Evan Williams, PA-C Sheng Haley, PA-C  Xika Zhao, NP Kathleen Lakia Gritton, PA-C   We recommend signing up for the patient portal called MyChart.  Sign up information is provided on this After Visit Summary.  MyChart is used to connect with patients for Virtual Visits (Telemedicine).  Patients are able to view lab/test results, encounter notes, upcoming appointments, etc.  Non-urgent messages can be sent to your provider as well.   To learn more about what you can do with MyChart, go to [email or web address].

## 2024-06-27 ENCOUNTER — Ambulatory Visit (HOSPITAL_BASED_OUTPATIENT_CLINIC_OR_DEPARTMENT_OTHER)
Admission: RE | Admit: 2024-06-27 | Discharge: 2024-06-27 | Disposition: A | Discharge status: Home / Self Care | Source: Ambulatory Visit | Attending: Internal Medicine | Admitting: Internal Medicine

## 2024-06-27 ENCOUNTER — Ambulatory Visit (HOSPITAL_COMMUNITY)
Admission: RE | Admit: 2024-06-27 | Discharge: 2024-06-27 | Disposition: A | Discharge status: Home / Self Care | Source: Ambulatory Visit | Attending: Internal Medicine | Admitting: Internal Medicine

## 2024-06-27 DIAGNOSIS — I739 Peripheral vascular disease, unspecified: Secondary | ICD-10-CM

## 2024-06-28 LAB — VAS US ABI WITH/WO TBI
Left ABI: 0.54
Right ABI: 0.54

## 2024-06-29 ENCOUNTER — Ambulatory Visit: Payer: Self-pay | Admitting: Internal Medicine

## 2024-06-29 DIAGNOSIS — I739 Peripheral vascular disease, unspecified: Secondary | ICD-10-CM

## 2024-07-06 ENCOUNTER — Ambulatory Visit: Payer: Medicare HMO

## 2024-07-11 ENCOUNTER — Encounter: Payer: Self-pay | Admitting: Podiatry

## 2024-07-11 ENCOUNTER — Ambulatory Visit: Admitting: Podiatry

## 2024-07-11 DIAGNOSIS — E1122 Type 2 diabetes mellitus with diabetic chronic kidney disease: Secondary | ICD-10-CM

## 2024-07-11 DIAGNOSIS — N1831 Chronic kidney disease, stage 3a: Secondary | ICD-10-CM

## 2024-07-11 DIAGNOSIS — B351 Tinea unguium: Secondary | ICD-10-CM | POA: Diagnosis not present

## 2024-07-11 DIAGNOSIS — I70219 Atherosclerosis of native arteries of extremities with intermittent claudication, unspecified extremity: Secondary | ICD-10-CM

## 2024-07-11 DIAGNOSIS — M79675 Pain in left toe(s): Secondary | ICD-10-CM | POA: Diagnosis not present

## 2024-07-11 DIAGNOSIS — M79674 Pain in right toe(s): Secondary | ICD-10-CM

## 2024-07-14 ENCOUNTER — Ambulatory Visit (HOSPITAL_COMMUNITY)
Admission: RE | Admit: 2024-07-14 | Discharge: 2024-07-14 | Disposition: A | Discharge status: Home / Self Care | Source: Ambulatory Visit | Attending: Internal Medicine | Admitting: Internal Medicine

## 2024-07-14 DIAGNOSIS — I739 Peripheral vascular disease, unspecified: Secondary | ICD-10-CM | POA: Diagnosis not present

## 2024-07-16 ENCOUNTER — Other Ambulatory Visit: Payer: Self-pay | Admitting: Internal Medicine

## 2024-07-16 ENCOUNTER — Ambulatory Visit: Payer: Self-pay | Admitting: Internal Medicine

## 2024-07-16 NOTE — Progress Notes (Signed)
  Subjective:  Patient ID: Bridget Oliver, female    DOB: 10/14/1941,  MRN: [id number]  Bridget Oliver presents to clinic today for at risk foot care. Pt has h/o NIDDM with chronic kidney disease and painful thick toenails that are difficult to trim. Pain interferes with ambulation. Aggravating factors include wearing enclosed shoe gear. Pain is relieved with periodic professional debridement.  Patient has been worked up for claudication symptoms. Has seen Vascular Surgery and had ABIs performed. Awaiting further testing with Vascular Surgery. Chief Complaint  Patient presents with   rfc    Rm16 Routine foot care/ Dr. Garald last visit June 21 2024   New problem(s): None.   PCP is [name], Karlynn GAILS, MD.  Allergies  Allergen Reactions   Atorvastatin      Bad cramps   Oxycodone Other (See Comments)    Nausea Can take codein    Review of Systems: Negative except as noted in the HPI.  Objective: No changes noted in today's physical examination. There were no vitals filed for this visit. Bridget Oliver is a pleasant 83 y.o. female in NAD. AAO x 3.  Vascular Examination: Vascular status intact b/l with palpable pedal pulses. CFT immediate b/l. No edema. No pain with calf compression b/l. Skin temperature gradient WNL b/l. No edema noted b/l LE.  Neurological Examination: Sensation grossly intact b/l with 10 gram monofilament.   Dermatological Examination: Pedal skin with normal turgor, texture and tone b/l. Toenails left great toe and 2-5 b/l thick, discolored, elongated with subungual debris and pain on dorsal palpation. Anonychia right hallux. No hyperkeratotic lesions noted b/l.   Musculoskeletal Examination: Muscle strength 5/5 to b/l LE. HAV with bunion bilaterally and hammertoes 2-5 b/l.  Radiographs: None  Assessment/Plan: 1. Pain due to onychomycosis of toenails of both feet   2. Atherosclerotic PVD with intermittent claudication (HCC)   3. Type 2 diabetes mellitus with  stage 3a chronic kidney disease, without long-term current use of insulin (HCC)   Patient being managed for claudication by Vascular Surgery. Consent given for treatment. Patient examined. All patient's and/or POA's questions/concerns addressed on today's visit. Toenails left great toe and  2-5 bilaterally debrided in length and girth without incident. Continue foot and shoe inspections daily. Monitor blood glucose per PCP/Endocrinologist's recommendations. Continue soft, supportive shoe gear daily. Report any pedal injuries to medical professional. Call office if there are any questions/concerns. Return in about 3 months (around 10/11/2024).  Delon LITTIE Merlin, DPM      [facility] LOCATION: [address] [place] [address]                   Office [phone]   [facility] LOCATION: [address] [place], [place] [address] Office [phone]

## 2024-08-01 DIAGNOSIS — Z961 Presence of intraocular lens: Secondary | ICD-10-CM | POA: Diagnosis not present

## 2024-08-01 DIAGNOSIS — H43813 Vitreous degeneration, bilateral: Secondary | ICD-10-CM | POA: Diagnosis not present

## 2024-08-01 DIAGNOSIS — H26491 Other secondary cataract, right eye: Secondary | ICD-10-CM | POA: Diagnosis not present

## 2024-08-01 DIAGNOSIS — E119 Type 2 diabetes mellitus without complications: Secondary | ICD-10-CM | POA: Diagnosis not present

## 2024-08-01 LAB — HM DIABETES EYE EXAM

## 2024-08-03 ENCOUNTER — Telehealth: Payer: Self-pay

## 2024-08-03 NOTE — Telephone Encounter (Signed)
 Copied from CRM [id number]. Topic: Clinical - Medication Question >> Aug 03, 2024  1:38 PM Martin[place] E wrote: Reason for CRM: Patient would like a prescription sent over for the Covid vaccine to [facility] [address] she stated whichever Covid vaccine PCP recommends for her. Please call patient once it has been sent.

## 2024-08-04 ENCOUNTER — Ambulatory Visit (INDEPENDENT_AMBULATORY_CARE_PROVIDER_SITE_OTHER)

## 2024-08-04 VITALS — Ht 63.0 in | Wt 180.0 lb

## 2024-08-04 DIAGNOSIS — Z0000 Encounter for general adult medical examination without abnormal findings: Secondary | ICD-10-CM | POA: Diagnosis not present

## 2024-08-04 DIAGNOSIS — Z780 Asymptomatic menopausal state: Secondary | ICD-10-CM | POA: Diagnosis not present

## 2024-08-04 NOTE — Progress Notes (Signed)
 Subjective:   Bridget Oliver is a 83 y.o. who presents for a Medicare Wellness preventive visit.  As a reminder, Annual Wellness Visits don't include a physical exam, and some assessments may be limited, especially if this visit is performed virtually. We may recommend an in-person follow-up visit with your provider if needed.  Visit Complete: Virtual I connected with  Bridget Oliver on 08/04/24 by a audio enabled telemedicine application and verified that I am speaking with the correct person using two identifiers.  Patient Location: Home  Provider Location: Office/Clinic  I discussed the limitations of evaluation and management by telemedicine. The patient expressed understanding and agreed to proceed.  Vital Signs: Because this visit was a virtual/telehealth visit, some criteria may be missing or patient reported. Any vitals not documented were not able to be obtained and vitals that have been documented are patient reported.  VideoDeclined- This patient declined Librarian, academic. Therefore the visit was completed with audio only.  Persons Participating in Visit: Patient.  AWV Questionnaire: Yes: Patient Medicare AWV questionnaire was completed by the patient on 08/03/2024; I have confirmed that all information answered by patient is correct and no changes since this date.  Cardiac Risk Factors include: advanced age (>59men, >9 women);hypertension;diabetes mellitus;Other (see comment);dyslipidemia;obesity (BMI >30kg/m2), Risk factor comments: CKD stage4     Objective:    Today's Vitals   08/04/24 1417  Weight: 180 lb (81.6 kg)  Height: 5' 3 (1.6 m)   Body mass index is 31.89 kg/m.     08/04/2024    2:30 PM 07/06/2023    1:35 PM 09/30/2021    6:00 PM 06/14/2020    2:25 PM 01/29/2016    9:33 AM  Advanced Directives  Does Patient Have a Medical Advance Directive? Yes Yes Yes No No   Type of Estate agent of [place];Living will  Healthcare Power of [place];Living will Healthcare Power of [place];Living will    Does patient want to make changes to medical advance directive?   No - Patient declined    Copy of Healthcare Power of Attorney in Chart? No - copy requested No - copy requested No - copy requested    Would patient like information on creating a medical advance directive?    No - Patient declined      Data saved with a previous flowsheet row definition    Current Medications (verified) Outpatient Encounter Medications as of 08/04/2024  Medication Sig   allopurinol  (ZYLOPRIM ) 100 MG tablet TAKE 1/2 TABLET BY MOUTH DAILY   amLODipine  (NORVASC ) 10 MG tablet Take 1 tablet (10 mg total) by mouth daily.   aspirin  EC 81 MG EC tablet Take 1 tablet (81 mg total) by mouth daily. Swallow whole.   atorvastatin  (LIPITOR) 10 MG tablet Take 1 tablet (10 mg total) by mouth daily.   carvedilol  (COREG ) 25 MG tablet TAKE 1 TABLET (25 MG TOTAL) BY MOUTH TWICE A DAY WITH MEALS   Cholecalciferol (VITAMIN D3) 2000 UNITS capsule Take 1 capsule (2,000 Units total) by mouth daily.   Coenzyme Q10 (COQ10) 100 MG CAPS Take 1 capsule by mouth daily.   COMIRNATY syringe    ezetimibe  (ZETIA ) 10 MG tablet TAKE 1 TABLET BY MOUTH EVERY DAY   FARXIGA 5 MG TABS tablet Take 5 mg by mouth daily.   Fish Oil-Cholecalciferol (FISH OIL + D3) 1200-1000 MG-UNIT CAPS Take 1 capsule by mouth daily.   furosemide  (LASIX ) 80 MG tablet Take 1 tablet (80 mg  total) by mouth daily.   hydrALAZINE  (APRESOLINE ) 100 MG tablet Take 100 mg by mouth 3 (three) times daily.   losartan  (COZAAR ) 100 MG tablet TAKE 1 TABLET BY MOUTH EVERYDAY AT BEDTIME   MAGNESIUM GLUCONATE PO Take by mouth.   Multiple Vitamins-Minerals (CENTRUM SILVER PO) Take 1 tablet by mouth daily.    nitroGLYCERIN  (NITROSTAT ) 0.4 MG SL tablet Place 1 tablet (0.4 mg total) under the tongue every 5 (five) minutes as needed for chest pain. Reported on 05/28/2016   ondansetron  (ZOFRAN ) 4 MG tablet Take 1  tablet (4 mg total) by mouth every 8 (eight) hours as needed for nausea or vomiting.   pantoprazole  (PROTONIX ) 40 MG tablet TAKE 1 TABLET BY MOUTH EVERY DAY   Polyethyl Glycol-Propyl Glycol (SYSTANE FREE OP) Place 1 drop into both eyes daily as needed (dry eyes).   Probiotic Product (ALIGN) 4 MG CAPS Take 1 capsule (4 mg total) by mouth daily.   spironolactone (ALDACTONE) 25 MG tablet Take 25 mg by mouth daily.   vitamin B-12 (CYANOCOBALAMIN ) 1000 MCG tablet Take 500 mcg by mouth every other day.    No facility-administered encounter medications on file as of 08/04/2024.    Allergies (verified) Atorvastatin  and Oxycodone   History: Past Medical History:  Diagnosis Date   CRI (chronic renal insufficiency)    Stage 3   Diabetes mellitus 2011   Type II diet controlled   Diarrhea    GERD (gastroesophageal reflux disease)    Gout    Hyperglycemia    Hyperlipidemia    Hypertension    Lactose intolerance    Macular degeneration    Obesity    Swelling of lower limb    Past Surgical History:  Procedure Laterality Date   ABDOMINAL HYSTERECTOMY  1985   APPENDECTOMY  1985   CATARACT EXTRACTION  2010 & 2011   bilateral   CESAREAN SECTION  1969   LUMBAR LAMINECTOMY  2007   PARS PLANA VITRECTOMY W/ REPAIR OF MACULAR HOLE  2010 & 2011   Bilateral   Family History  Problem Relation Age of Onset   Hypertension Mother    Hypertension Father    Lung cancer Father        smoker   Colon cancer Neg Hx    Esophageal cancer Neg Hx    Stomach cancer Neg Hx    Rectal cancer Neg Hx    Social History   Socioeconomic History   Marital status: Married    Spouse name: Zachary   Number of children: 1   Years of education: Not on file   Highest education level: Never attended school  Occupational History   Occupation: RETIRED  Tobacco Use   Smoking status: Never   Smokeless tobacco: Never  Vaping Use   Vaping status: Never Used  Substance and Sexual Activity   Alcohol use: Yes     Alcohol/week: 0.0 standard drinks of alcohol    Comment: rarely   Drug use: No   Sexual activity: Not on file  Other Topics Concern   Not on file  Social History Narrative   Lives with husband/2025   Social Drivers of Health   Financial Resource Strain: Medium Risk (08/03/2024)   Overall Financial Resource Strain (CARDIA)    Difficulty of Paying Living Expenses: Somewhat hard  Food Insecurity: Food Insecurity Present (08/03/2024)   Hunger Vital Sign    Worried About Running Out of Food in the Last Year: Sometimes true    Ran Out of  Food in the Last Year: Never true  Transportation Needs: No Transportation Needs (08/03/2024)   PRAPARE - Administrator, Civil Service (Medical): No    Lack of Transportation (Non-Medical): No  Physical Activity: Inactive (08/03/2024)   Exercise Vital Sign    Days of Exercise per Week: 0 days    Minutes of Exercise per Session: Not on file  Stress: Stress Concern Present (08/03/2024)   [employer] of Occupational Health - Occupational Stress Questionnaire    Feeling of Stress: To some extent  Social Connections: Unknown (08/03/2024)   Social Connection and Isolation Panel    Frequency of Communication with Friends and Family: Not on file    Frequency of Social Gatherings with Friends and Family: Once a week    Attends Religious Services: 1 to 4 times per year    Active Member of [employer] or Organizations: No    Attends Engineer, structural: Not on file    Marital Status: Married    Tobacco Counseling Counseling given: Not Answered    Clinical Intake:  Pre-visit preparation completed: Yes  Pain : No/denies pain     BMI - recorded: 31.89 Nutritional Status: BMI > 30  Obese Nutritional Risks: None Diabetes: Yes CBG done?: No Did pt. bring in CBG monitor from home?: No  Lab Results  Component Value Date   HGBA1C 5.0 01/31/2024   HGBA1C 5.1 11/11/2022   HGBA1C 5.3 03/02/2022     How often do you need to have  someone help you when you read instructions, pamphlets, or other written materials from your doctor or pharmacy?: 1 - Never  Interpreter Needed?: No  Information entered by :: Kirkland Figg, RMA   Activities of Daily Living     08/03/2024    2:03 PM  In your present state of health, do you have any difficulty performing the following activities:  Hearing? 0  Vision? 0  Difficulty concentrating or making decisions? 0  Walking or climbing stairs? 0  Dressing or bathing? 0  Doing errands, shopping? 0  Preparing Food and eating ? N  Using the Toilet? N  In the past six months, have you accidently leaked urine? Y  Do you have problems with loss of bowel control? N  Managing your Medications? N  Managing your Finances? N  Housekeeping or managing your Housekeeping? N    Patient Care Team: [name], Karlynn GAILS, MD as PCP - General Hilty, Vinie BROCKS, MD as PCP - Cardiology (Cardiology) Cleatus Collar, MD (Ophthalmology) Vernetta Lonni GRADE, MD (Orthopedic Surgery) Mona Vinie BROCKS, MD as Consulting Physician (Cardiology) Gearline Norris, MD as Consulting Physician (Nephrology)  I have updated your Care Teams any recent Medical Services you may have received from other providers in the past year.     Assessment:   This is a routine wellness examination for Bridget Oliver.  Hearing/Vision screen Hearing Screening - Comments:: Denies hearing difficulties   Vision Screening - Comments:: Wears eyeglasses/ [facility]   Goals Addressed               This Visit's Progress     Patient Stated (pt-stated)        I would like to be more active by exercising more. Still working toward it/2025       Depression Screen     08/04/2024    2:31 PM 11/22/2023    1:38 PM 07/06/2023    1:39 PM 05/17/2023    9:14 AM 11/11/2022    9:02  AM 06/15/2022    9:57 AM 11/05/2021    9:23 AM  PHQ 2/9 Scores  PHQ - 2 Score 0 2 0 0 0 4 0  PHQ- 9 Score 2 9 2   0 13 2    Fall Risk      08/03/2024    2:03 PM 02/02/2024    3:08 PM 11/22/2023    1:38 PM 07/06/2023    1:36 PM 05/17/2023    9:14 AM  Fall Risk   Falls in the past year? 0 1 0 0 0  Number falls in past yr: 0 0 0 0 0  Injury with Fall? 1 1 0 0 0  Risk for fall due to :   No Fall Risks No Fall Risks No Fall Risks  Follow up Falls evaluation completed;Falls prevention discussed Falls evaluation completed Falls evaluation completed Falls prevention discussed Falls evaluation completed    MEDICARE RISK AT HOME:  Medicare Risk at Home Any stairs in or around the home?: (Patient-Rptd) No If so, are there any without handrails?: (Patient-Rptd) Yes Home free of loose throw rugs in walkways, pet beds, electrical cords, etc?: (Patient-Rptd) Yes Adequate lighting in your home to reduce risk of falls?: (Patient-Rptd) Yes Life alert?: (Patient-Rptd) No Use of a cane, walker or w/c?: (Patient-Rptd) No Grab bars in the bathroom?: (Patient-Rptd) No Shower chair or bench in shower?: (Patient-Rptd) Yes Elevated toilet seat or a handicapped toilet?: (Patient-Rptd) Yes  TIMED UP AND GO:  Was the test performed?  No  Cognitive Function: Declined/Normal: No cognitive concerns noted by patient or family. Patient alert, oriented, able to answer questions appropriately and recall recent events. No signs of memory loss or confusion.        07/06/2023    1:41 PM 06/14/2020    2:26 PM  6CIT Screen  What Year? 0 points 0 points  What month? 0 points 0 points  What time? 0 points 0 points  Count back from 20 0 points 0 points  Months in reverse 0 points 0 points  Repeat phrase 0 points 0 points  Total Score 0 points 0 points    Immunizations Immunization History  Administered Date(s) Administered   Fluad Quad(high Dose 65+) 08/11/2019, 08/19/2020, 09/17/2022   Fluad Trivalent(High Dose 65+) 08/31/2023   INFLUENZA, HIGH DOSE SEASONAL PF 09/14/2013, 09/11/2017, 09/13/2018, 08/23/2021, 09/10/2021   Influenza Whole  10/07/2010, 07/25/2011, 08/26/2012   Influenza,inj,Quad PF,6+ Mos 07/25/2014, 08/16/2015   PFIZER Comirnaty(Gray Top)Covid-19 Tri-Sucrose Vaccine 03/26/2021   PFIZER(Purple Top)SARS-COV-2 Vaccination 12/12/2019, 01/02/2020, 08/27/2020   PNEUMOCOCCAL CONJUGATE-20 11/05/2021   Pfizer Covid-19 Vaccine Bivalent Booster 90yrs & up 08/28/2022   Pfizer(Comirnaty)Fall Seasonal Vaccine 12 years and older 09/06/2023   Pneumococcal Conjugate-13 11/26/2014   Pneumococcal Polysaccharide-23 11/10/2011   Rsv, Mab, [place], 0.5 Ml, Neonate To 24 Mos(Beyfortus) 08/03/2022   Tdap 11/10/2011, 06/10/2021   Zoster Recombinant(Shingrix) 09/08/2018, 01/10/2019   Zoster, Live 02/28/2008    Screening Tests Health Maintenance  Topic Date Due   OPHTHALMOLOGY EXAM  01/22/2023   Influenza Vaccine  06/23/2024   Medicare Annual Wellness (AWV)  07/05/2024   COVID-19 Vaccine (7 - 2024-25 season) 07/24/2024   HEMOGLOBIN A1C  08/02/2024   FOOT EXAM  12/27/2024   Diabetic kidney evaluation - eGFR measurement  06/13/2025   Diabetic kidney evaluation - Urine ACR  06/13/2025   DTaP/Tdap/Td (3 - Td or Tdap) 06/11/2031   Pneumococcal Vaccine: 50+ Years  Completed   DEXA SCAN  Completed   Zoster Vaccines- Shingrix  Completed  HPV VACCINES  Aged Out   Meningococcal B Vaccine  Aged Out   Hepatitis C Screening  Discontinued    Health Maintenance Items Addressed: See Nurse Notes at the end of this note  Additional Screening:  Vision Screening: Recommended annual ophthalmology exams for early detection of glaucoma and other disorders of the eye. Is the patient up to date with their annual eye exam?  No  Who is the provider or what is the name of the office in which the patient attends annual eye exams? Dr. Arlena Reusing  Dental Screening: Recommended annual dental exams for proper oral hygiene  Community Resource Referral / Chronic Care Management: CRR required this visit?  No   CCM required this visit?   No   Plan:    I have personally reviewed and noted the following in the patient's chart:   Medical and social history Use of alcohol, tobacco or illicit drugs  Current medications and supplements including opioid prescriptions. Patient is not currently taking opioid prescriptions. Functional ability and status Nutritional status Physical activity Advanced directives List of other physicians Hospitalizations, surgeries, and ER visits in previous 12 months Vitals Screenings to include cognitive, depression, and falls Referrals and appointments  In addition, I have reviewed and discussed with patient certain preventive protocols, quality metrics, and best practice recommendations. A written personalized care plan for preventive services as well as general preventive health recommendations were provided to patient.   Felicie Kocher L Casimira Sutphin, CMA   08/04/2024   After Visit Summary: (MyChart) Due to this being a telephonic visit, the after visit summary with patients personalized plan was offered to patient via MyChart   Notes: [facility] and would like to get her Covid vaccine done during the same visit.  Patient called to inform Dr. Garald to send a Covid Rx in to pharmacy.  She is due for a Dexa and that has been ordered today.  Patient would like to discuss a BILATERAL CAROTID DUPLEX ULTRASOUND with Dr. Garald during her next office visit.  Patient stated that she has had a recent eye exam and I have sent a request for records to be sent today.

## 2024-08-04 NOTE — Patient Instructions (Signed)
 [name],  Thank you for taking the time for your Medicare Wellness Visit. I appreciate your continued commitment to your health goals. Please review the care plan we discussed, and feel free to reach out if I can assist you further.  Medicare recommends these wellness visits once per year to help you and your care team stay ahead of potential health issues. These visits are designed to focus on prevention, allowing your provider to concentrate on managing your acute and chronic conditions during your regular appointments.  Please note that Annual Wellness Visits do not include a physical exam. Some assessments may be limited, especially if the visit was conducted virtually. If needed, we may recommend a separate in-person follow-up with your provider.  Ongoing Care Seeing your primary care provider every 3 to 6 months helps us  monitor your health and provide consistent, personalized care. Next office visit on 11/21/2024.  You are due for a flu vaccine.  Remember to discuss a BILATERAL CAROTID DUPLEX ULTRASOUND with Dr. Garald during your next office visit.  You will have an A1C check done during your next office visit.    Referrals If a referral was made during today's visit and you haven't received any updates within two weeks, please contact the referred provider directly to check on the status.  Recommended Screenings:  Health Maintenance  Topic Date Due   Eye exam for diabetics  01/22/2023   Flu Shot  06/23/2024   Medicare Annual Wellness Visit  07/05/2024   COVID-19 Vaccine (7 - 2024-25 season) 07/24/2024   Hemoglobin A1C  08/02/2024   Complete foot exam   12/27/2024   Yearly kidney function blood test for diabetes  06/13/2025   Yearly kidney health urinalysis for diabetes  06/13/2025   DTaP/Tdap/Td vaccine (3 - Td or Tdap) 06/11/2031   Pneumococcal Vaccine for age over 12  Completed   DEXA scan (bone density measurement)  Completed   Zoster (Shingles) Vaccine  Completed   HPV  Vaccine  Aged Out   Meningitis B Vaccine  Aged Out   Hepatitis C Screening  Discontinued       08/04/2024    2:30 PM  Advanced Directives  Does Patient Have a Medical Advance Directive? Yes  Type of Estate agent of [place];Living will  Copy of Healthcare Power of Attorney in Chart? No - copy requested   Advance Care Planning is important because it: Ensures you receive medical care that aligns with your values, goals, and preferences. Provides guidance to your family and loved ones, reducing the emotional burden of decision-making during critical moments.  Vision: Annual vision screenings are recommended for early detection of glaucoma, cataracts, and diabetic retinopathy. These exams can also reveal signs of chronic conditions such as diabetes and high blood pressure.  Dental: Annual dental screenings help detect early signs of oral cancer, gum disease, and other conditions linked to overall health, including heart disease and diabetes.  Please see the attached documents for additional preventive care recommendations.

## 2024-08-06 MED ORDER — COVID-19 SUBUNIT VACC-NOVAVAX 5 MCG/0.5ML IM SUSY: 1.0000 | PREFILLED_SYRINGE | Freq: Once | INTRAMUSCULAR | 0 refills | Status: AC

## 2024-08-06 NOTE — Telephone Encounter (Signed)
 Okay.  Thanks.

## 2024-08-23 ENCOUNTER — Other Ambulatory Visit: Payer: Self-pay | Admitting: Family

## 2024-09-11 ENCOUNTER — Other Ambulatory Visit: Payer: Self-pay | Admitting: Internal Medicine

## 2024-09-28 ENCOUNTER — Other Ambulatory Visit: Payer: Self-pay | Admitting: Internal Medicine

## 2024-10-03 DIAGNOSIS — N189 Chronic kidney disease, unspecified: Secondary | ICD-10-CM | POA: Diagnosis not present

## 2024-10-03 DIAGNOSIS — N2581 Secondary hyperparathyroidism of renal origin: Secondary | ICD-10-CM | POA: Diagnosis not present

## 2024-10-03 DIAGNOSIS — E1122 Type 2 diabetes mellitus with diabetic chronic kidney disease: Secondary | ICD-10-CM | POA: Diagnosis not present

## 2024-10-03 DIAGNOSIS — N184 Chronic kidney disease, stage 4 (severe): Secondary | ICD-10-CM | POA: Diagnosis not present

## 2024-10-03 DIAGNOSIS — I129 Hypertensive chronic kidney disease with stage 1 through stage 4 chronic kidney disease, or unspecified chronic kidney disease: Secondary | ICD-10-CM | POA: Diagnosis not present

## 2024-10-03 LAB — LAB REPORT - SCANNED
Albumin, Urine POC: 3.2
Albumin/Creatinine Ratio, Urine, POC: 15
Creatinine, POC: 21.4 mg/dL
EGFR: 17

## 2024-10-12 ENCOUNTER — Telehealth: Payer: Self-pay | Admitting: Internal Medicine

## 2024-10-12 DIAGNOSIS — E1159 Type 2 diabetes mellitus with other circulatory complications: Secondary | ICD-10-CM

## 2024-10-12 NOTE — Telephone Encounter (Unsigned)
 Copied from CRM #[id number]. Topic: General - Other >> Oct 12, 2024  9:15 AM Jasmin G wrote: Reason for CRM: Pt called regarding recent missed call from [place] Jon SAUNDERS, please refer to recent Encounters for more info. Call pt back at [phone].

## 2024-10-12 NOTE — Progress Notes (Signed)
 Pharmacy Quality Measure Review  This patient is appearing on a report for being at risk of failing the adherence measure for diabetes and hypertension (ACEi/ARB) medications this calendar year.   Medication: Farxiga 5mg  Last fill date: 09/01 for 30 day supply  States she takes every day, receives through mail order. Prescribed by her nephrology team. She does not miss doses and has enough refills at this time. All questions were answered.  Angela Baalmann, PharmD [facility] Pharmacist

## 2024-10-13 NOTE — Telephone Encounter (Signed)
 Noted. Thanks.

## 2024-10-25 ENCOUNTER — Ambulatory Visit: Admitting: Podiatry

## 2024-10-25 ENCOUNTER — Encounter: Payer: Self-pay | Admitting: Podiatry

## 2024-10-25 DIAGNOSIS — I70219 Atherosclerosis of native arteries of extremities with intermittent claudication, unspecified extremity: Secondary | ICD-10-CM

## 2024-10-25 DIAGNOSIS — E1122 Type 2 diabetes mellitus with diabetic chronic kidney disease: Secondary | ICD-10-CM

## 2024-10-25 DIAGNOSIS — M79674 Pain in right toe(s): Secondary | ICD-10-CM

## 2024-10-25 DIAGNOSIS — B351 Tinea unguium: Secondary | ICD-10-CM | POA: Diagnosis not present

## 2024-10-25 DIAGNOSIS — M79675 Pain in left toe(s): Secondary | ICD-10-CM

## 2024-10-25 DIAGNOSIS — N1831 Chronic kidney disease, stage 3a: Secondary | ICD-10-CM

## 2024-10-29 NOTE — Progress Notes (Signed)
  Subjective:  Patient ID: Bridget Oliver, female    DOB: September 28, 1941,  MRN: [id number]  Bridget Oliver presents to clinic today for at risk foot care. Pt has h/o NIDDM with chronic kidney disease and painful mycotic toenails of both feet that are difficult to trim. Pain interferes with daily activities and wearing enclosed shoe gear comfortably.   New problem(s): None.   PCP is [name], Karlynn GAILS, MD. Bridget 05/22/24.  Allergies  Allergen Reactions   Atorvastatin      Bad cramps   Oxycodone Other (See Comments)    Nausea Can take codein    Review of Systems: Negative except as noted in the HPI.  Objective: No changes noted in today's physical examination. There were no vitals filed for this visit. Bridget Oliver is a pleasant 83 y.o. female in NAD. AAO x 3.  Vascular Examination: Vascular status intact b/l with palpable pedal pulses. CFT immediate b/l. No edema. No pain with calf compression b/l. Skin temperature gradient WNL b/l. No edema noted b/l LE.  Neurological Examination: Sensation grossly intact b/l with 10 gram monofilament.   Dermatological Examination: Pedal skin with normal turgor, texture and tone b/l. Toenails left great toe and 2-5 b/l thick, discolored, elongated with subungual debris and pain on dorsal palpation. Anonychia right hallux. No hyperkeratotic lesions noted b/l.   Musculoskeletal Examination: Muscle strength 5/5 to b/l LE. HAV with bunion bilaterally and hammertoes 2-5 b/l.  Radiographs: None  Assessment/Plan: 1. Pain due to onychomycosis of toenails of both feet   2. Atherosclerotic PVD with intermittent claudication   3. Type 2 diabetes mellitus with stage 3a chronic kidney disease, without long-term current use of insulin (HCC)   -Consent given for treatment as described below: -Examined patient. -Continue foot and shoe inspections daily. Monitor blood glucose per PCP/Endocrinologist's recommendations. -Mycotic toenails 2-5 bilaterally and left great  toe were debrided in length and girth with sterile nail nippers and dremel without iatrogenic bleeding. -Treatment was provided by assistant Mable FABIENE Cherry under my supervision. -Patient/POA to call should there be question/concern in the interim.   Return in about 3 months (around 01/23/2025).  Delon LITTIE Merlin, DPM      [place] LOCATION: [address] [place] [address]                   Office [phone]   [facility] LOCATION: [address] [place], [place] [address] Office [phone]

## 2024-11-17 ENCOUNTER — Encounter: Payer: Self-pay | Admitting: Internal Medicine

## 2024-11-19 ENCOUNTER — Other Ambulatory Visit: Payer: Self-pay | Admitting: Internal Medicine

## 2024-11-21 ENCOUNTER — Ambulatory Visit: Admitting: Internal Medicine

## 2024-11-21 ENCOUNTER — Encounter: Payer: Self-pay | Admitting: Internal Medicine

## 2024-11-21 ENCOUNTER — Telehealth: Payer: Self-pay

## 2024-11-21 VITALS — BP 110/62 | HR 64 | Temp 98.1°F | Ht 63.0 in | Wt 176.0 lb

## 2024-11-21 DIAGNOSIS — Z7984 Long term (current) use of oral hypoglycemic drugs: Secondary | ICD-10-CM | POA: Diagnosis not present

## 2024-11-21 DIAGNOSIS — E1122 Type 2 diabetes mellitus with diabetic chronic kidney disease: Secondary | ICD-10-CM | POA: Diagnosis not present

## 2024-11-21 DIAGNOSIS — D631 Anemia in chronic kidney disease: Secondary | ICD-10-CM

## 2024-11-21 DIAGNOSIS — R609 Edema, unspecified: Secondary | ICD-10-CM | POA: Diagnosis not present

## 2024-11-21 DIAGNOSIS — N184 Chronic kidney disease, stage 4 (severe): Secondary | ICD-10-CM

## 2024-11-21 DIAGNOSIS — E538 Deficiency of other specified B group vitamins: Secondary | ICD-10-CM | POA: Diagnosis not present

## 2024-11-21 DIAGNOSIS — N1831 Chronic kidney disease, stage 3a: Secondary | ICD-10-CM

## 2024-11-21 LAB — URINALYSIS
Bilirubin Urine: NEGATIVE
Hgb urine dipstick: NEGATIVE
Ketones, ur: NEGATIVE
Leukocytes,Ua: NEGATIVE
Nitrite: NEGATIVE
Specific Gravity, Urine: 1.01 (ref 1.000–1.030)
Total Protein, Urine: NEGATIVE
Urine Glucose: 100 — AB
Urobilinogen, UA: 0.2 (ref 0.0–1.0)
pH: 6 (ref 5.0–8.0)

## 2024-11-21 LAB — LIPID PANEL
Cholesterol: 105 mg/dL (ref 28–200)
HDL: 38.6 mg/dL — ABNORMAL LOW
LDL Cholesterol: 50 mg/dL (ref 10–99)
NonHDL: 66.72
Total CHOL/HDL Ratio: 3
Triglycerides: 84 mg/dL (ref 10.0–149.0)
VLDL: 16.8 mg/dL (ref 0.0–40.0)

## 2024-11-21 LAB — CBC WITH DIFFERENTIAL/PLATELET
Basophils Absolute: 0.1 K/uL (ref 0.0–0.1)
Basophils Relative: 0.9 % (ref 0.0–3.0)
Eosinophils Absolute: 0.1 K/uL (ref 0.0–0.7)
Eosinophils Relative: 1.3 % (ref 0.0–5.0)
HCT: 31 % — ABNORMAL LOW (ref 36.0–46.0)
Hemoglobin: 10.3 g/dL — ABNORMAL LOW (ref 12.0–15.0)
Lymphocytes Relative: 26.1 % (ref 12.0–46.0)
Lymphs Abs: 1.9 K/uL (ref 0.7–4.0)
MCHC: 33.3 g/dL (ref 30.0–36.0)
MCV: 86.8 fl (ref 78.0–100.0)
Monocytes Absolute: 0.6 K/uL (ref 0.1–1.0)
Monocytes Relative: 8.8 % (ref 3.0–12.0)
Neutro Abs: 4.6 K/uL (ref 1.4–7.7)
Neutrophils Relative %: 62.9 % (ref 43.0–77.0)
Platelets: 204 K/uL (ref 150.0–400.0)
RBC: 3.57 Mil/uL — ABNORMAL LOW (ref 3.87–5.11)
RDW: 14.6 % (ref 11.5–15.5)
WBC: 7.3 K/uL (ref 4.0–10.5)

## 2024-11-21 LAB — COMPREHENSIVE METABOLIC PANEL WITH GFR
ALT: 13 U/L (ref 3–35)
AST: 19 U/L (ref 5–37)
Albumin: 3.9 g/dL (ref 3.5–5.2)
Alkaline Phosphatase: 44 U/L (ref 39–117)
BUN: 54 mg/dL — ABNORMAL HIGH (ref 6–23)
CO2: 25 meq/L (ref 19–32)
Calcium: 9.3 mg/dL (ref 8.4–10.5)
Chloride: 102 meq/L (ref 96–112)
Creatinine, Ser: 2.94 mg/dL — ABNORMAL HIGH (ref 0.40–1.20)
GFR: 14.38 mL/min — CL
Glucose, Bld: 101 mg/dL — ABNORMAL HIGH (ref 70–99)
Potassium: 4.2 meq/L (ref 3.5–5.1)
Sodium: 136 meq/L (ref 135–145)
Total Bilirubin: 0.4 mg/dL (ref 0.2–1.2)
Total Protein: 6.9 g/dL (ref 6.0–8.3)

## 2024-11-21 LAB — VITAMIN B12: Vitamin B-12: 982 pg/mL — ABNORMAL HIGH (ref 211–911)

## 2024-11-21 LAB — VITAMIN D 25 HYDROXY (VIT D DEFICIENCY, FRACTURES): VITD: 56.86 ng/mL (ref 30.00–100.00)

## 2024-11-21 LAB — TSH: TSH: 2.39 u[IU]/mL (ref 0.35–5.50)

## 2024-11-21 LAB — HEMOGLOBIN A1C: Hgb A1c MFr Bld: 4.9 % (ref 4.6–6.5)

## 2024-11-21 NOTE — Telephone Encounter (Signed)
 CRITICAL VALUE STICKER  CRITICAL VALUE: GFR 14.38  RECEIVER (on-site recipient of call): Isamar Nazir  DATE & TIME NOTIFIED: 12/30 403pm  MESSENGER (representative from lab): Jasmine  MD NOTIFIED: PCP, Dr.[name]  TIME OF NOTIFICATION: 404pm  RESPONSE: MD notified

## 2024-11-21 NOTE — Assessment & Plan Note (Signed)
Cont on Vit B12 

## 2024-11-21 NOTE — Assessment & Plan Note (Signed)
 On diet, Marcelline Deist

## 2024-11-21 NOTE — Assessment & Plan Note (Signed)
 On Furosemide  qd

## 2024-11-21 NOTE — Assessment & Plan Note (Signed)
" °  Anemia - discuss EPO inj w/Dr Gearline (Hgb <10 this year) "

## 2024-11-21 NOTE — Assessment & Plan Note (Signed)
 Cont w/Hydralazine , Losartan , Furosemide  F/u w/Dr FORBES Bonine Drink 16-20 oz of water prior to labs  Anemia - discuss EPO inj w/Dr Bonine (Hgb <10 this year)

## 2024-11-21 NOTE — Progress Notes (Signed)
 "  Subjective:  Patient ID: Bridget Oliver, female    DOB: [birth year]  Age: 83 y.o. MRN: [id number]  CC: Medical Management of Chronic Issues (6 mnth f/u, pt wants to discuss bone density testing and anemia testing)   HPI Bridget Oliver presents for anemia, HTN, CRF   Outpatient Medications Prior to Visit  Medication Sig Dispense Refill   allopurinol  (ZYLOPRIM ) 100 MG tablet TAKE 1/2 TABLET BY MOUTH DAILY 45 tablet 1   amLODipine  (NORVASC ) 10 MG tablet Take 1 tablet (10 mg total) by mouth daily. 90 tablet 3   aspirin  EC 81 MG EC tablet Take 1 tablet (81 mg total) by mouth daily. Swallow whole. 30 tablet 11   atorvastatin  (LIPITOR) 10 MG tablet Take 1 tablet (10 mg total) by mouth daily. 90 tablet 1   carvedilol  (COREG ) 25 MG tablet TAKE 1 TABLET (25 MG TOTAL) BY MOUTH TWICE A DAY WITH MEALS 180 tablet 1   Cholecalciferol (VITAMIN D3) 2000 UNITS capsule Take 1 capsule (2,000 Units total) by mouth daily. 100 capsule 3   Coenzyme Q10 (COQ10) 100 MG CAPS Take 1 capsule by mouth daily.     COMIRNATY syringe      ezetimibe  (ZETIA ) 10 MG tablet TAKE 1 TABLET BY MOUTH EVERY DAY 90 tablet 1   FARXIGA 5 MG TABS tablet Take 5 mg by mouth daily.     Fish Oil-Cholecalciferol (FISH OIL + D3) 1200-1000 MG-UNIT CAPS Take 1 capsule by mouth daily.     furosemide  (LASIX ) 80 MG tablet Take 1 tablet (80 mg total) by mouth daily. 90 tablet 3   hydrALAZINE  (APRESOLINE ) 100 MG tablet Take 100 mg by mouth 3 (three) times daily.     losartan  (COZAAR ) 100 MG tablet TAKE 1 TABLET BY MOUTH EVERYDAY AT BEDTIME 90 tablet 2   MAGNESIUM GLUCONATE PO Take by mouth.     Multiple Vitamins-Minerals (CENTRUM SILVER PO) Take 1 tablet by mouth daily.      nitroGLYCERIN  (NITROSTAT ) 0.4 MG SL tablet Place 1 tablet (0.4 mg total) under the tongue every 5 (five) minutes as needed for chest pain. Reported on 05/28/2016 25 tablet 2   ondansetron  (ZOFRAN ) 4 MG tablet Take 1 tablet (4 mg total) by mouth every 8 (eight) hours as needed for  nausea or vomiting. 30 tablet 0   pantoprazole  (PROTONIX ) 40 MG tablet TAKE 1 TABLET BY MOUTH EVERY DAY 90 tablet 3   Polyethyl Glycol-Propyl Glycol (SYSTANE FREE OP) Place 1 drop into both eyes daily as needed (dry eyes).     Probiotic Product (ALIGN) 4 MG CAPS Take 1 capsule (4 mg total) by mouth daily. 30 capsule 0   spironolactone (ALDACTONE) 25 MG tablet Take 25 mg by mouth daily.     vitamin B-12 (CYANOCOBALAMIN ) 1000 MCG tablet Take 500 mcg by mouth every other day.      No facility-administered medications prior to visit.    ROS: Review of Systems  Constitutional:  Positive for fatigue. Negative for activity change, appetite change, chills and unexpected weight change.  HENT:  Negative for congestion, mouth sores and sinus pressure.   Eyes:  Negative for visual disturbance.  Respiratory:  Negative for cough and chest tightness.   Cardiovascular:  Negative for chest pain.  Gastrointestinal:  Negative for abdominal pain and nausea.  Genitourinary:  Negative for difficulty urinating, frequency and vaginal pain.  Musculoskeletal:  Positive for arthralgias and gait problem. Negative for back pain.  Skin:  Negative for pallor and rash.  Neurological:  Negative for dizziness, tremors, weakness, numbness and headaches.  Psychiatric/Behavioral:  Negative for confusion, sleep disturbance and suicidal ideas.     Objective:  BP 110/62   Pulse 64   Temp 98.1 F (36.7 C) (Oral)   Ht 5' 3 (1.6 m)   Wt 176 lb (79.8 kg)   SpO2 98%   BMI 31.18 kg/m   BP Readings from Last 3 Encounters:  11/21/24 110/62  06/14/24 (!) 132/40  05/22/24 118/70    Wt Readings from Last 3 Encounters:  11/21/24 176 lb (79.8 kg)  08/04/24 180 lb (81.6 kg)  06/14/24 180 lb (81.6 kg)    Physical Exam Constitutional:      General: She is not in acute distress.    Appearance: She is well-developed. She is obese.  HENT:     Head: Normocephalic.     Right Ear: External ear normal.     Left Ear:  External ear normal.     Nose: Nose normal.  Eyes:     General:        Right eye: No discharge.        Left eye: No discharge.     Conjunctiva/sclera: Conjunctivae normal.     Pupils: Pupils are equal, round, and reactive to light.  Neck:     Thyroid : No thyromegaly.     Vascular: No JVD.     Trachea: No tracheal deviation.  Cardiovascular:     Rate and Rhythm: Normal rate and regular rhythm.     Heart sounds: Normal heart sounds.  Pulmonary:     Effort: No respiratory distress.     Breath sounds: No stridor. No wheezing.  Abdominal:     General: Bowel sounds are normal. There is no distension.     Palpations: Abdomen is soft. There is no mass.     Tenderness: There is no abdominal tenderness. There is no guarding or rebound.  Musculoskeletal:        General: No tenderness.     Cervical back: Normal range of motion and neck supple. No rigidity.  Lymphadenopathy:     Cervical: No cervical adenopathy.  Skin:    Findings: No erythema or rash.  Neurological:     Cranial Nerves: No cranial nerve deficit.     Motor: No abnormal muscle tone.     Coordination: Coordination normal.     Deep Tendon Reflexes: Reflexes normal.  Psychiatric:        Behavior: Behavior normal.        Thought Content: Thought content normal.        Judgment: Judgment normal.     Lab Results  Component Value Date   WBC 7.5 05/17/2024   HGB 9.5 (L) 05/17/2024   HCT 28.2 (L) 05/17/2024   PLT 175.0 05/17/2024   GLUCOSE 99 05/17/2024   CHOL 90 01/31/2024   TRIG 52.0 01/31/2024   HDL 36.60 (L) 01/31/2024   LDLDIRECT 127.9 07/14/2011   LDLCALC 43 01/31/2024   ALT 11 05/17/2024   AST 15 05/17/2024   NA 134 (L) 05/17/2024   K 4.4 05/17/2024   CL 100 05/17/2024   CREATININE 2.73 (H) 05/17/2024   BUN 53 (H) 05/17/2024   CO2 25 05/17/2024   TSH 3.27 05/06/2021   INR 0.98 06/07/2014   HGBA1C 5.0 01/31/2024   MICROALBUR 2.6 (H) 01/31/2024    VAS US  AORTA/IVC/ILIACS Result Date:  07/16/2024 ABDOMINAL AORTA STUDY Patient Name:  Bridget Oliver  Date of Exam:  07/14/2024 Medical Rec #: [id number]    Accession #:    [id number] Date of Birth: 1941-07-09    Patient Gender: F Patient Age:   10 years Exam Location:  [address] Procedure:      VAS US  AORTA/IVC/ILIACS Referring Phys: VINIE HILTY --------------------------------------------------------------------------------  Indications: Peripheral arterial disease. Patient reports bilateral calf              claudication symptoms after walking about 10-15 minutes. Limitations: Air/bowel gas and obesity.  Comparison Study: NA Performing Technologist: Nanetta Shad RVT  Examination Guidelines: A complete evaluation includes B-mode imaging, spectral Doppler, color Doppler, and power Doppler as needed of all accessible portions of each vessel. Bilateral testing is considered an integral part of a complete examination. Limited examinations for reoccurring indications may be performed as noted.  Abdominal Aorta Findings: +-------------+-------+----------+----------+----------+--------+--------+ Location     AP (cm)Trans (cm)PSV (cm/s)Waveform  ThrombusComments +-------------+-------+----------+----------+----------+--------+--------+ Proximal     1.70   1.70      80        monophasic                 +-------------+-------+----------+----------+----------+--------+--------+ Mid                           436       monophasic                 +-------------+-------+----------+----------+----------+--------+--------+ Distal                        149       monophasic                 +-------------+-------+----------+----------+----------+--------+--------+ RT CIA Prox                   156       monophasic                 +-------------+-------+----------+----------+----------+--------+--------+ RT CIA Mid                    146       monophasic                  +-------------+-------+----------+----------+----------+--------+--------+ RT CIA Distal                 151       monophasic                 +-------------+-------+----------+----------+----------+--------+--------+ RT EIA Prox                   89        monophasic                 +-------------+-------+----------+----------+----------+--------+--------+ RT EIA Mid                    103       monophasic                 +-------------+-------+----------+----------+----------+--------+--------+ RT EIA Distal                 86        monophasic                 +-------------+-------+----------+----------+----------+--------+--------+ LT CIA Prox                   94  monophasic                 +-------------+-------+----------+----------+----------+--------+--------+ LT CIA Mid                    118       monophasic                 +-------------+-------+----------+----------+----------+--------+--------+ LT CIA Distal                 137       monophasic                 +-------------+-------+----------+----------+----------+--------+--------+ LT EIA Prox                   77        monophasic                 +-------------+-------+----------+----------+----------+--------+--------+ LT EIA Mid                    112       monophasic                 +-------------+-------+----------+----------+----------+--------+--------+ LT EIA Distal                 78        monophasic                 +-------------+-------+----------+----------+----------+--------+--------+ IVC/Iliac Findings: +--------+------+--------+--------+   IVC   PatentThrombusComments +--------+------+--------+--------+ IVC Proxpatent                 +--------+------+--------+--------+   Summary: Stenosis: +---------+-------------+ Location Stenosis      +---------+-------------+ 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 IVC/Iliac: Patent IVC.  *See table(s) above for measurements and observations.  Electronically signed by Evalene Lunger MD on 07/16/2024 at 9:30:22 AM.    Final     Assessment & Plan:   Problem List Items Addressed This Visit     B12 deficiency   Cont on Vit B12      Edema   On Furosemide  qd      Relevant Orders   Hemoglobin A1c   DM (diabetes mellitus), type 2 with renal complications (HCC)   On diet, Farxiga      Relevant Orders   TSH   Urinalysis   CBC with Differential/Platelet   Lipid panel   Comprehensive metabolic panel with GFR   Iron, TIBC and Ferritin Panel   Vitamin B12   VITAMIN D  25 Hydroxy (Vit-D Deficiency, Fractures)   CKD (chronic kidney disease) stage 4, GFR 15-29 ml/min (HCC) - Primary   Cont w/Hydralazine , Losartan , Furosemide  F/u w/Dr FORBES Bonine Drink 16-20 oz of water prior to labs  Anemia - discuss EPO inj w/Dr Bonine (Hgb <10 this year)      Relevant Orders   TSH   Urinalysis   CBC with Differential/Platelet   Lipid panel   Comprehensive metabolic panel with GFR   Iron, TIBC and Ferritin Panel   Vitamin B12   VITAMIN D  25 Hydroxy (Vit-D Deficiency, Fractures)   Anemia in chronic renal disease    Anemia - discuss EPO inj w/Dr Bonine (Hgb <10 this year)      Relevant Orders   TSH   Urinalysis   CBC with Differential/Platelet   Lipid panel   Comprehensive metabolic panel with GFR   Iron, TIBC and Ferritin Panel  Vitamin B12   VITAMIN D  25 Hydroxy (Vit-D Deficiency, Fractures)      No orders of the defined types were placed in this encounter.     Follow-up: Return in about 6 months (around 05/22/2025) for Wellness Exam.  Marolyn Noel, MD "

## 2024-11-22 LAB — IRON,TIBC AND FERRITIN PANEL
%SAT: 35 % (ref 16–45)
Ferritin: 115 ng/mL (ref 16–288)
Iron: 87 ug/dL (ref 45–160)
TIBC: 250 ug/dL (ref 250–450)

## 2024-11-22 NOTE — Telephone Encounter (Signed)
 Noted.- CRF Thx

## 2024-11-24 ENCOUNTER — Ambulatory Visit: Payer: Self-pay | Admitting: Internal Medicine

## 2025-02-07 ENCOUNTER — Ambulatory Visit: Admitting: Podiatry

## 2025-03-08 ENCOUNTER — Ambulatory Visit: Admitting: Internal Medicine

## 2025-05-22 ENCOUNTER — Encounter: Admitting: Internal Medicine
